# Patient Record
Sex: Male | Born: 1965 | Race: White | Hispanic: No | Marital: Married | Smoking: Never smoker
Health system: Southern US, Community
[De-identification: ages and names within clinical notes are randomized; demographics above are authoritative.]

## PROBLEM LIST (undated history)

## (undated) DIAGNOSIS — M4306 Spondylolysis, lumbar region: Secondary | ICD-10-CM

## (undated) DIAGNOSIS — R361 Hematospermia: Secondary | ICD-10-CM

## (undated) DIAGNOSIS — C4491 Basal cell carcinoma of skin, unspecified: Secondary | ICD-10-CM

## (undated) DIAGNOSIS — Z9889 Other specified postprocedural states: Secondary | ICD-10-CM

## (undated) DIAGNOSIS — M1711 Unilateral primary osteoarthritis, right knee: Secondary | ICD-10-CM

## (undated) DIAGNOSIS — B029 Zoster without complications: Secondary | ICD-10-CM

## (undated) DIAGNOSIS — I480 Paroxysmal atrial fibrillation: Secondary | ICD-10-CM

## (undated) DIAGNOSIS — R112 Nausea with vomiting, unspecified: Secondary | ICD-10-CM

## (undated) HISTORY — DX: Basal cell carcinoma of skin, unspecified: C44.91

## (undated) HISTORY — DX: Paroxysmal atrial fibrillation: I48.0

## (undated) HISTORY — DX: Hematospermia: R36.1

## (undated) HISTORY — DX: Unilateral primary osteoarthritis, right knee: M17.11

## (undated) HISTORY — PX: ORIF DISTAL RADIUS FRACTURE: SUR927

## (undated) HISTORY — PX: NASAL SEPTUM SURGERY: SHX37

## (undated) HISTORY — PX: KNEE ARTHROSCOPY: SUR90

## (undated) HISTORY — PX: VASECTOMY: SHX75

---

## 1988-09-03 HISTORY — PX: KNEE ARTHROSCOPY: SUR90

## 1994-09-03 HISTORY — PX: ANKLE FRACTURE SURGERY: SHX122

## 2009-05-20 ENCOUNTER — Encounter: Admission: RE | Admit: 2009-05-20 | Discharge: 2009-05-20 | Payer: Self-pay | Admitting: Orthopedic Surgery

## 2009-09-03 HISTORY — PX: SHOULDER ARTHROSCOPY W/ ROTATOR CUFF REPAIR: SHX2400

## 2010-06-23 ENCOUNTER — Encounter: Admission: RE | Admit: 2010-06-23 | Discharge: 2010-06-23 | Payer: Self-pay | Admitting: Orthopedic Surgery

## 2010-09-03 HISTORY — PX: SHOULDER ARTHROSCOPY WITH ROTATOR CUFF REPAIR: SHX5685

## 2010-09-25 ENCOUNTER — Encounter: Payer: Self-pay | Admitting: Orthopedic Surgery

## 2012-09-03 DIAGNOSIS — B029 Zoster without complications: Secondary | ICD-10-CM

## 2012-09-03 HISTORY — DX: Zoster without complications: B02.9

## 2012-11-08 ENCOUNTER — Emergency Department (HOSPITAL_BASED_OUTPATIENT_CLINIC_OR_DEPARTMENT_OTHER)
Admission: EM | Admit: 2012-11-08 | Discharge: 2012-11-08 | Disposition: A | Discharge status: Home / Self Care | Payer: BC Managed Care – PPO | Attending: Emergency Medicine | Admitting: Emergency Medicine

## 2012-11-08 ENCOUNTER — Encounter (HOSPITAL_BASED_OUTPATIENT_CLINIC_OR_DEPARTMENT_OTHER): Payer: Self-pay | Admitting: Emergency Medicine

## 2012-11-08 DIAGNOSIS — S81009A Unspecified open wound, unspecified knee, initial encounter: Secondary | ICD-10-CM | POA: Insufficient documentation

## 2012-11-08 DIAGNOSIS — S91009A Unspecified open wound, unspecified ankle, initial encounter: Secondary | ICD-10-CM | POA: Insufficient documentation

## 2012-11-08 DIAGNOSIS — Z8739 Personal history of other diseases of the musculoskeletal system and connective tissue: Secondary | ICD-10-CM | POA: Insufficient documentation

## 2012-11-08 DIAGNOSIS — Y929 Unspecified place or not applicable: Secondary | ICD-10-CM | POA: Insufficient documentation

## 2012-11-08 DIAGNOSIS — W312XXA Contact with powered woodworking and forming machines, initial encounter: Secondary | ICD-10-CM | POA: Insufficient documentation

## 2012-11-08 DIAGNOSIS — Y9389 Activity, other specified: Secondary | ICD-10-CM | POA: Insufficient documentation

## 2012-11-08 DIAGNOSIS — S81012A Laceration without foreign body, left knee, initial encounter: Secondary | ICD-10-CM

## 2012-11-08 DIAGNOSIS — Z23 Encounter for immunization: Secondary | ICD-10-CM | POA: Insufficient documentation

## 2012-11-08 HISTORY — DX: Spondylolysis, lumbar region: M43.06

## 2012-11-08 MED ORDER — TETANUS-DIPHTH-ACELL PERTUSSIS 5-2.5-18.5 LF-MCG/0.5 IM SUSP
0.5000 mL | Freq: Once | INTRAMUSCULAR | Status: AC
  Administered 2012-11-08: 0.5 mL via INTRAMUSCULAR
  Filled 2012-11-08: qty 0.5

## 2012-11-08 MED ORDER — OXYCODONE-ACETAMINOPHEN 5-325 MG PO TABS
2.0000 | ORAL_TABLET | Freq: Once | ORAL | Status: AC
  Administered 2012-11-08: 2 via ORAL
  Filled 2012-11-08 (×2): qty 2
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
## 2012-11-08 NOTE — ED Notes (Signed)
Laceration by chainsaw to left knee.  Bleeding controlled.  Good CMS.

## 2012-11-13 ENCOUNTER — Ambulatory Visit: Payer: BC Managed Care – PPO | Admitting: Family Medicine

## 2013-06-03 ENCOUNTER — Other Ambulatory Visit (HOSPITAL_COMMUNITY): Payer: Self-pay | Admitting: Orthopedic Surgery

## 2013-06-03 DIAGNOSIS — M25561 Pain in right knee: Secondary | ICD-10-CM

## 2013-06-05 ENCOUNTER — Other Ambulatory Visit (HOSPITAL_COMMUNITY): Payer: BC Managed Care – PPO

## 2013-07-15 ENCOUNTER — Encounter (HOSPITAL_COMMUNITY): Payer: Self-pay | Admitting: Pharmacy Technician

## 2013-07-16 ENCOUNTER — Other Ambulatory Visit: Payer: Self-pay | Admitting: Physician Assistant

## 2013-07-16 ENCOUNTER — Encounter: Payer: Self-pay | Admitting: Physician Assistant

## 2013-07-16 DIAGNOSIS — M1711 Unilateral primary osteoarthritis, right knee: Secondary | ICD-10-CM

## 2013-07-16 DIAGNOSIS — M4306 Spondylolysis, lumbar region: Secondary | ICD-10-CM

## 2013-07-16 NOTE — H&P (Signed)
TOTAL KNEE ADMISSION H&P  Patient is being admitted for right total knee arthroplasty.  Subjective:  Chief Complaint:right knee pain.  HPI: Bruce Cobb, 47 y.o. male, has a history of pain and functional disability in the right knee due to trauma and arthritis and has failed non-surgical conservative treatments for greater than 12 weeks to includeNSAID's and/or analgesics, corticosteriod injections, viscosupplementation injections, flexibility and strengthening excercises, supervised PT with diminished ADL's post treatment, use of assistive devices, weight reduction as appropriate and activity modification.  Onset of symptoms was gradual, starting >10 years ago with gradually worsening course since that time. The patient noted prior procedures on the knee to include  arthroscopy, menisectomy and ACL reconstruction on the right knee(s).  Patient currently rates pain in the right knee(s) at 10 out of 10 with activity. Patient has night pain, worsening of pain with activity and weight bearing, pain that interferes with activities of daily living, crepitus and joint swelling.  Patient has evidence of subchondral sclerosis, joint subluxation and joint space narrowing by imaging studies. There is no active infection.  Patient Active Problem List   Diagnosis Date Noted  . Pars defect of lumbar spine   . Right knee DJD    Past Medical History  Diagnosis Date  . Pars defect of lumbar spine   . Right knee DJD     Past Surgical History  Procedure Laterality Date  . Shoulder surgery    . Knee surgery    . Ankle surgery       (Not in a hospital admission) No Known Allergies   Current Outpatient Prescriptions on File Prior to Visit  Medication Sig Dispense Refill  . naproxen sodium (ANAPROX) 220 MG tablet Take 220 mg by mouth 2 (two) times daily as needed (for pain).        No current facility-administered medications on file prior to visit.    History  Substance Use Topics  . Smoking status:  Never Smoker   . Smokeless tobacco: Not on file  . Alcohol Use: Yes     Comment: wine daily    Family History  Problem Relation Age of Onset  . Cancer - Prostate Father      Review of Systems  Constitutional: Negative.   HENT: Negative.   Eyes: Negative.   Respiratory: Negative.   Cardiovascular: Negative.   Gastrointestinal: Negative.   Genitourinary: Negative.   Musculoskeletal: Positive for joint pain.       Right knee pain  Skin: Negative.   Neurological: Negative.   Endo/Heme/Allergies: Negative.   Psychiatric/Behavioral: Negative.     Objective:  Physical Exam  Constitutional: He is oriented to person, place, and time. He appears well-developed and well-nourished.  HENT:  Head: Normocephalic and atraumatic.  Eyes: Conjunctivae and EOM are normal. Pupils are equal, round, and reactive to light.  Neck: Neck supple.  Cardiovascular: Normal rate and regular rhythm.   Respiratory: Effort normal and breath sounds normal.  GI: Soft.  Genitourinary:  Not pertinent to current symptomatology therefore not examined.  Musculoskeletal:  Examination of his right knee reveals pain medially and laterally. 1+ crepitation 1+ synovitis full range of motion knee is stable with normal patella tracking. Exam of the left knee reveals full range of motion without pain swelling weakness or instability.  Neurological: He is alert and oriented to person, place, and time.  Skin: Skin is warm and dry.  Psychiatric: He has a normal mood and affect. His behavior is normal.    Vital signs  in last 24 hours: Last recorded: 11/13 1100   BP: 128/85 Pulse: 62  Temp: 97.8 F (36.6 C)    Height: 6' (1.829 m) SpO2: 95  Weight: 84.369 kg (186 lb)     Labs:   Estimated body mass index is 25.22 kg/(m^2) as calculated from the following:   Height as of this encounter: 6' (1.829 m).   Weight as of this encounter: 84.369 kg (186 lb).   Imaging Review Plain radiographs demonstrate severe  degenerative joint disease of the right knee(s). The overall alignment issignificant varus. The bone quality appears to be good for age and reported activity level.  Assessment/Plan:  End stage arthritis, right knee   The patient history, physical examination, clinical judgment of the provider and imaging studies are consistent with end stage degenerative joint disease of the right knee(s) and total knee arthroplasty is deemed medically necessary. The treatment options including medical management, injection therapy arthroscopy and arthroplasty were discussed at length. The risks and benefits of total knee arthroplasty were presented and reviewed. The risks due to aseptic loosening, infection, stiffness, patella tracking problems, thromboembolic complications and other imponderables were discussed. The patient acknowledged the explanation, agreed to proceed with the plan and consent was signed. Patient is being admitted for inpatient treatment for surgery, pain control, PT, OT, prophylactic antibiotics, VTE prophylaxis, progressive ambulation and ADL's and discharge planning. The patient is planning to be discharged home with home health services  [name] [name] Physician Assistant [facility] [phone]  07/16/2013, 12:08 PM

## 2013-07-17 NOTE — Pre-Procedure Instructions (Signed)
Bruce Cobb  07/17/2013   Your procedure is scheduled on:  Monday July 27, 2013 at 1000  Report to [facility] Short Stay Entrance A at 0800 AM.  Call this number if you have problems the morning of surgery: [phone]   Remember:   Do not eat food or drink liquids after midnight.   Take these medicines the morning of surgery with A SIP OF WATER:    Do not wear jewelry.  Do not wear lotions, powders, or colognes.    Men may shave face and neck.  Do not bring valuables to the hospital.  [facility] is not responsible for any belongings or valuables.               Contacts, dentures or bridgework may not be worn into surgery.  Leave suitcase in the car. After surgery it may be brought to your room.  For patients admitted to the hospital, discharge time is determined by your  treatment team.                 Special Instructions: Shower using CHG 2 nights before surgery and the night before surgery.  If you shower the day of surgery use CHG.  Use special wash - you have one bottle of CHG for all showers.  You should use approximately 1/3 of the bottle for each shower.   Please read over the following fact sheets that you were given: Pain Booklet, Coughing and Deep Breathing, Blood Transfusion Information, MRSA Information and Surgical Site Infection Prevention

## 2013-07-20 ENCOUNTER — Encounter (HOSPITAL_COMMUNITY): Payer: Self-pay

## 2013-07-20 ENCOUNTER — Encounter (HOSPITAL_COMMUNITY)
Admission: RE | Admit: 2013-07-20 | Discharge: 2013-07-20 | Disposition: A | Discharge status: Home / Self Care | Payer: BC Managed Care – PPO | Source: Ambulatory Visit | Attending: Orthopedic Surgery | Admitting: Orthopedic Surgery

## 2013-07-20 DIAGNOSIS — Z01818 Encounter for other preprocedural examination: Secondary | ICD-10-CM | POA: Insufficient documentation

## 2013-07-20 DIAGNOSIS — Z01812 Encounter for preprocedural laboratory examination: Secondary | ICD-10-CM | POA: Insufficient documentation

## 2013-07-20 HISTORY — DX: Other specified postprocedural states: Z98.890

## 2013-07-20 HISTORY — DX: Other specified postprocedural states: R11.2

## 2013-07-20 LAB — URINALYSIS, ROUTINE W REFLEX MICROSCOPIC
Glucose, UA: NEGATIVE mg/dL
Hgb urine dipstick: NEGATIVE
Ketones, ur: NEGATIVE mg/dL
Protein, ur: NEGATIVE mg/dL
Specific Gravity, Urine: 1.009 (ref 1.005–1.030)
Urobilinogen, UA: 0.2 mg/dL (ref 0.0–1.0)

## 2013-07-20 LAB — CBC WITH DIFFERENTIAL/PLATELET
Basophils Absolute: 0 10*3/uL (ref 0.0–0.1)
Basophils Relative: 0 % (ref 0–1)
Eosinophils Absolute: 0.1 10*3/uL (ref 0.0–0.7)
Eosinophils Relative: 1 % (ref 0–5)
HCT: 40.8 % (ref 39.0–52.0)
Hemoglobin: 14.9 g/dL (ref 13.0–17.0)
Lymphs Abs: 1.3 10*3/uL (ref 0.7–4.0)
MCH: 32.6 pg (ref 26.0–34.0)
MCV: 89.3 fL (ref 78.0–100.0)
Monocytes Absolute: 0.6 10*3/uL (ref 0.1–1.0)
Monocytes Relative: 13 % — ABNORMAL HIGH (ref 3–12)
RBC: 4.57 MIL/uL (ref 4.22–5.81)
RDW: 11.9 % (ref 11.5–15.5)

## 2013-07-20 LAB — COMPREHENSIVE METABOLIC PANEL
ALT: 19 U/L (ref 0–53)
Albumin: 4.1 g/dL (ref 3.5–5.2)
Alkaline Phosphatase: 66 U/L (ref 39–117)
BUN: 18 mg/dL (ref 6–23)
Calcium: 9.5 mg/dL (ref 8.4–10.5)
Creatinine, Ser: 0.88 mg/dL (ref 0.50–1.35)
GFR calc Af Amer: >90 mL/min (ref >90)
Glucose, Bld: 82 mg/dL (ref 70–99)
Potassium: 4.5 mEq/L (ref 3.5–5.1)
Total Protein: 7 g/dL (ref 6.0–8.3)

## 2013-07-20 LAB — PROTIME-INR
INR: 0.97 (ref 0.00–1.49)
Prothrombin Time: 12.7 seconds (ref 11.6–15.2)

## 2013-07-20 NOTE — Progress Notes (Signed)
Pt. Requested T&S be done the day of surgery.  EKG and CXR requested from [facility].

## 2013-07-22 LAB — URINE CULTURE
Colony Count: NO GROWTH
Culture: NO GROWTH

## 2013-07-31 NOTE — Progress Notes (Addendum)
I spoke with Bruce Cobb, he reports that surgery is rescheduled for Feb 2015.  I notified Dr Dion Saucier of above.

## 2013-08-03 ENCOUNTER — Inpatient Hospital Stay (HOSPITAL_COMMUNITY)
Admission: RE | Admit: 2013-08-03 | Payer: BC Managed Care – PPO | Source: Ambulatory Visit | Admitting: Orthopedic Surgery

## 2013-08-03 ENCOUNTER — Encounter (HOSPITAL_COMMUNITY): Admission: RE | Payer: BC Managed Care – PPO | Source: Ambulatory Visit

## 2013-08-03 SURGERY — ARTHROPLASTY, KNEE, TOTAL
Anesthesia: General | Laterality: Right

## 2013-09-29 ENCOUNTER — Encounter (HOSPITAL_COMMUNITY): Payer: Self-pay | Admitting: Pharmacy Technician

## 2013-09-30 ENCOUNTER — Other Ambulatory Visit: Payer: Self-pay | Admitting: Physician Assistant

## 2013-09-30 NOTE — H&P (Signed)
TOTAL KNEE ADMISSION H&P  Patient is being admitted for right total knee arthroplasty.  Subjective:  Chief Complaint:right knee pain.  HPI: Bruce Cobb, 48 y.o. male, has a history of pain and functional disability in the right knee due to arthritis and has failed non-surgical conservative treatments for greater than 12 weeks to includeNSAID's and/or analgesics, corticosteriod injections, viscosupplementation injections, flexibility and strengthening excercises, supervised PT with diminished ADL's post treatment, use of assistive devices and activity modification.  Onset of symptoms was gradual, starting 10 years ago with gradually worsening course since that time. The patient noted prior procedures on the knee to include  arthroscopy, menisectomy and ACL reconstruction on the right knee(s).  Patient currently rates pain in the right knee(s) at 10 out of 10 with activity. Patient has night pain, worsening of pain with activity and weight bearing, pain that interferes with activities of daily living, crepitus and joint swelling.  Patient has evidence of subchondral sclerosis, periarticular osteophytes and joint space narrowing by imaging studies.There is no active infection.  Patient Active Problem List   Diagnosis Date Noted  . Pars defect of lumbar spine   . Right knee DJD    Past Medical History  Diagnosis Date  . Pars defect of lumbar spine   . Right knee DJD   . PONV (postoperative nausea and vomiting)     Past Surgical History  Procedure Laterality Date  . Shoulder surgery    . Knee surgery    . Ankle surgery    . Nasal septum surgery       (Not in a hospital admission) No Known Allergies  History  Substance Use Topics  . Smoking status: Never Smoker   . Smokeless tobacco: Not on file  . Alcohol Use: Yes     Comment: wine daily    Family History  Problem Relation Age of Onset  . Cancer - Prostate Father      Review of Systems  Constitutional: Negative.   HENT:  Negative.   Eyes: Negative.   Respiratory: Negative.   Cardiovascular: Negative.   Gastrointestinal: Negative.   Genitourinary: Negative.   Musculoskeletal: Positive for back pain and joint pain.  Skin: Negative.   Neurological: Negative.   Endo/Heme/Allergies: Negative.   Psychiatric/Behavioral: Negative.     Objective:  Physical Exam  Constitutional: He is oriented to person, place, and time. He appears well-developed and well-nourished.  HENT:  Head: Normocephalic and atraumatic.  Mouth/Throat: Oropharynx is clear and moist.  Eyes: Conjunctivae are normal. Pupils are equal, round, and reactive to light.  Neck: Neck supple.  Cardiovascular: Normal rate, regular rhythm and intact distal pulses.   Respiratory: Effort normal.  GI: Soft.  Genitourinary:  Not pertinent to current symptomatology therefore not examined.  Musculoskeletal:  Right knee has active range of motion -5 to 120 degrees 2+ crepitus 2+ synovitis normal patella tracking peripatellar pain and lateral joint line tenderness. Left knee has full range of motion without pain swelling weakness or instability. He is alert and oriented. He has low back pain but no radicular pain.   Neurological: He is alert and oriented to person, place, and time.  Skin: Skin is warm and dry.  Psychiatric: He has a normal mood and affect. His behavior is normal.    Vital signs in last 24 hours: Last recorded: 01/28 1500   BP: 125/79    Temp: 98.1 F (36.7 C)    Height: 5\' 11"  (1.803 m) SpO2: 95  Weight: 90.719 kg (200 lb)  Labs:   Estimated body mass index is 27.91 kg/(m^2) as calculated from the following:   Height as of this encounter: 5\' 11"  (1.803 m).   Weight as of this encounter: 90.719 kg (200 lb).   Imaging Review Plain radiographs demonstrate severe degenerative joint disease of the right knee(s). The overall alignment issignificant varus. The bone quality appears to be good for age and reported activity  level.  Assessment/Plan:  End stage arthritis, right knee   The patient history, physical examination, clinical judgment of the provider and imaging studies are consistent with end stage degenerative joint disease of the right knee(s) and total knee arthroplasty is deemed medically necessary. The treatment options including medical management, injection therapy arthroscopy and arthroplasty were discussed at length. The risks and benefits of total knee arthroplasty were presented and reviewed. The risks due to aseptic loosening, infection, stiffness, patella tracking problems, thromboembolic complications and other imponderables were discussed. The patient acknowledged the explanation, agreed to proceed with the plan and consent was signed. Patient is being admitted for inpatient treatment for surgery, pain control, PT, OT, prophylactic antibiotics, VTE prophylaxis, progressive ambulation and ADL's and discharge planning. The patient is planning to be discharged home with home health services  Manford Sprong A. Kaleen Mask Physician Assistant [facility] [phone]  09/30/2013, 3:17 PM

## 2013-10-02 ENCOUNTER — Encounter (HOSPITAL_COMMUNITY): Payer: Self-pay

## 2013-10-02 ENCOUNTER — Encounter (HOSPITAL_COMMUNITY)
Admission: RE | Admit: 2013-10-02 | Discharge: 2013-10-02 | Disposition: A | Discharge status: Home / Self Care | Payer: BC Managed Care – PPO | Source: Ambulatory Visit | Attending: Orthopedic Surgery | Admitting: Orthopedic Surgery

## 2013-10-02 DIAGNOSIS — Z01818 Encounter for other preprocedural examination: Secondary | ICD-10-CM | POA: Insufficient documentation

## 2013-10-02 DIAGNOSIS — Z01812 Encounter for preprocedural laboratory examination: Secondary | ICD-10-CM | POA: Insufficient documentation

## 2013-10-02 HISTORY — DX: Zoster without complications: B02.9

## 2013-10-02 LAB — COMPREHENSIVE METABOLIC PANEL
ALT: 24 U/L (ref 0–53)
AST: 31 U/L (ref 0–37)
Albumin: 4.1 g/dL (ref 3.5–5.2)
Alkaline Phosphatase: 56 U/L (ref 39–117)
BUN: 14 mg/dL (ref 6–23)
CALCIUM: 9.5 mg/dL (ref 8.4–10.5)
CHLORIDE: 101 meq/L (ref 96–112)
CO2: 24 meq/L (ref 19–32)
CREATININE: 0.97 mg/dL (ref 0.50–1.35)
GLUCOSE: 91 mg/dL (ref 70–99)
Potassium: 4.4 mEq/L (ref 3.7–5.3)
Sodium: 138 mEq/L (ref 137–147)
Total Bilirubin: 0.8 mg/dL (ref 0.3–1.2)
Total Protein: 7 g/dL (ref 6.0–8.3)

## 2013-10-02 LAB — CBC WITH DIFFERENTIAL/PLATELET
Basophils Absolute: 0 10*3/uL (ref 0.0–0.1)
Basophils Relative: 0 % (ref 0–1)
EOS PCT: 1 % (ref 0–5)
Eosinophils Absolute: 0.1 10*3/uL (ref 0.0–0.7)
HEMATOCRIT: 40.6 % (ref 39.0–52.0)
HEMOGLOBIN: 14.5 g/dL (ref 13.0–17.0)
LYMPHS ABS: 1.3 10*3/uL (ref 0.7–4.0)
LYMPHS PCT: 31 % (ref 12–46)
MCH: 32.1 pg (ref 26.0–34.0)
MCHC: 35.7 g/dL (ref 30.0–36.0)
MCV: 89.8 fL (ref 78.0–100.0)
MONO ABS: 0.7 10*3/uL (ref 0.1–1.0)
MONOS PCT: 16 % — AB (ref 3–12)
NEUTROS ABS: 2.1 10*3/uL (ref 1.7–7.7)
Neutrophils Relative %: 51 % (ref 43–77)
Platelets: 228 10*3/uL (ref 150–400)
RBC: 4.52 MIL/uL (ref 4.22–5.81)
RDW: 12.2 % (ref 11.5–15.5)
WBC: 4.1 10*3/uL (ref 4.0–10.5)

## 2013-10-02 LAB — PROTIME-INR
INR: 0.97 (ref 0.00–1.49)
Prothrombin Time: 12.7 seconds (ref 11.6–15.2)

## 2013-10-02 LAB — URINALYSIS, ROUTINE W REFLEX MICROSCOPIC
Bilirubin Urine: NEGATIVE
Glucose, UA: NEGATIVE mg/dL
Hgb urine dipstick: NEGATIVE
KETONES UR: NEGATIVE mg/dL
Leukocytes, UA: NEGATIVE
NITRITE: NEGATIVE
PH: 7.5 (ref 5.0–8.0)
Protein, ur: NEGATIVE mg/dL
Specific Gravity, Urine: 1.016 (ref 1.005–1.030)
Urobilinogen, UA: 0.2 mg/dL (ref 0.0–1.0)

## 2013-10-02 LAB — APTT: APTT: 32 s (ref 24–37)

## 2013-10-02 MED ORDER — CHLORHEXIDINE GLUCONATE 4 % EX LIQD: 60.0000 mL | Freq: Once | CUTANEOUS | Status: DC

## 2013-10-02 MED ORDER — POVIDONE-IODINE 7.5 % EX SOLN: Freq: Once | CUTANEOUS | Status: DC

## 2013-10-02 NOTE — Pre-Procedure Instructions (Signed)
Bruce Cobb  10/02/2013   Your procedure is scheduled on:  Mon, Feb 9 @ 10:00 AM  Report to [facility] Short Stay Entrance A  at 8:00 AM.  Call this number if you have problems the morning of surgery: [phone]   Remember:   Do not eat food or drink liquids after midnight.                 Stop taking your Aleve a week before surgery. No Goody's,BC's,Aspirin,Ibuprofen,Fish Oil,or any Herbal Medications   Do not wear jewelry.  Do not wear lotions, powders, or colognes. You may wear deodorant.  Men may shave face and neck.  Do not bring valuables to the hospital.  [facility] is not responsible                  for any belongings or valuables.               Contacts, dentures or bridgework may not be worn into surgery.  Leave suitcase in the car. After surgery it may be brought to your room.  For patients admitted to the hospital, discharge time is determined by your                treatment team.                   Special Instructions: Shower using CHG 2 nights before surgery and the night before surgery.  If you shower the day of surgery use CHG.  Use special wash - you have one bottle of CHG for all showers.  You should use approximately 1/3 of the bottle for each shower.   Please read over the following fact sheets that you were given: Pain Booklet, Coughing and Deep Breathing, Blood Transfusion Information, MRSA Information and Surgical Site Infection Prevention

## 2013-10-02 NOTE — Progress Notes (Signed)
CXR and EKG requested from [facility]  Pt refused to wear blue bracelet so type and screen DOS

## 2013-10-03 LAB — URINE CULTURE: Colony Count: 3000

## 2013-10-09 NOTE — Progress Notes (Signed)
Nurse called patient under "home" number listed and "Vinnie Level" answered phone and said patient was unavailable but asked if she could take a message. Nurse told Vinnie Level to tell patient to arrive at short stay on Monday at 7:00 AM instead of 8:00 AM.  Vinnie Level verbalized understanding.

## 2013-10-11 MED ORDER — TRANEXAMIC ACID 100 MG/ML IV SOLN
1000.0000 mg | INTRAVENOUS | Status: DC
  Filled 2013-10-11: qty 10

## 2013-10-11 MED ORDER — CEFAZOLIN SODIUM-DEXTROSE 2-3 GM-% IV SOLR
2.0000 g | INTRAVENOUS | Status: AC
  Administered 2013-10-12: 2 g via INTRAVENOUS
  Filled 2013-10-11: qty 50

## 2013-10-12 ENCOUNTER — Inpatient Hospital Stay (HOSPITAL_COMMUNITY)
Admission: RE | Admit: 2013-10-12 | Discharge: 2013-10-13 | DRG: 470 | Disposition: A | Discharge status: Home Health | Payer: BC Managed Care – PPO | Source: Ambulatory Visit | Attending: Orthopedic Surgery | Admitting: Orthopedic Surgery

## 2013-10-12 ENCOUNTER — Inpatient Hospital Stay (HOSPITAL_COMMUNITY): Payer: BC Managed Care – PPO

## 2013-10-12 ENCOUNTER — Inpatient Hospital Stay (HOSPITAL_COMMUNITY): Payer: BC Managed Care – PPO | Admitting: Anesthesiology

## 2013-10-12 ENCOUNTER — Encounter (HOSPITAL_COMMUNITY): Payer: Self-pay | Admitting: Surgery

## 2013-10-12 ENCOUNTER — Encounter (HOSPITAL_COMMUNITY)
Admission: RE | Disposition: A | Discharge status: Home Health | Payer: Self-pay | Source: Ambulatory Visit | Attending: Orthopedic Surgery

## 2013-10-12 ENCOUNTER — Encounter (HOSPITAL_COMMUNITY): Payer: BC Managed Care – PPO | Admitting: Anesthesiology

## 2013-10-12 DIAGNOSIS — M1710 Unilateral primary osteoarthritis, unspecified knee: Principal | ICD-10-CM | POA: Diagnosis present

## 2013-10-12 DIAGNOSIS — Z8042 Family history of malignant neoplasm of prostate: Secondary | ICD-10-CM

## 2013-10-12 DIAGNOSIS — M4306 Spondylolysis, lumbar region: Secondary | ICD-10-CM | POA: Diagnosis present

## 2013-10-12 DIAGNOSIS — M179 Osteoarthritis of knee, unspecified: Secondary | ICD-10-CM

## 2013-10-12 DIAGNOSIS — Z79899 Other long term (current) drug therapy: Secondary | ICD-10-CM

## 2013-10-12 DIAGNOSIS — M1990 Unspecified osteoarthritis, unspecified site: Secondary | ICD-10-CM | POA: Diagnosis present

## 2013-10-12 DIAGNOSIS — Z7982 Long term (current) use of aspirin: Secondary | ICD-10-CM

## 2013-10-12 HISTORY — PX: TOTAL KNEE ARTHROPLASTY: SHX125

## 2013-10-12 LAB — TYPE AND SCREEN
ABO/RH(D): O POS
Antibody Screen: NEGATIVE

## 2013-10-12 LAB — ABO/RH: ABO/RH(D): O POS

## 2013-10-12 SURGERY — ARTHROPLASTY, KNEE, TOTAL
Anesthesia: General | Site: Knee | Laterality: Right

## 2013-10-12 MED ORDER — BISACODYL 5 MG PO TBEC
10.0000 mg | DELAYED_RELEASE_TABLET | Freq: Every day | ORAL | Status: DC
  Administered 2013-10-12: 10 mg via ORAL
  Filled 2013-10-12: qty 2

## 2013-10-12 MED ORDER — DEXAMETHASONE 6 MG PO TABS
10.0000 mg | ORAL_TABLET | Freq: Three times a day (TID) | ORAL | Status: AC
  Filled 2013-10-12 (×3): qty 1

## 2013-10-12 MED ORDER — ACETAMINOPHEN 325 MG PO TABS: 650.0000 mg | ORAL_TABLET | Freq: Four times a day (QID) | ORAL | Status: DC | PRN

## 2013-10-12 MED ORDER — BUPIVACAINE-EPINEPHRINE PF 0.5-1:200000 % IJ SOLN
INTRAMUSCULAR | Status: DC | PRN
  Administered 2013-10-12: 150 mg via PERINEURAL

## 2013-10-12 MED ORDER — DIPHENHYDRAMINE HCL 12.5 MG/5ML PO ELIX: 12.5000 mg | ORAL_SOLUTION | ORAL | Status: DC | PRN

## 2013-10-12 MED ORDER — LACTATED RINGERS IV SOLN
INTRAVENOUS | Status: DC | PRN
  Administered 2013-10-12 (×2): via INTRAVENOUS

## 2013-10-12 MED ORDER — METOCLOPRAMIDE HCL 5 MG/ML IJ SOLN
INTRAMUSCULAR | Status: AC
  Filled 2013-10-12: qty 2

## 2013-10-12 MED ORDER — SODIUM CHLORIDE 0.9 % IR SOLN
Status: DC | PRN
  Administered 2013-10-12: 3000 mL

## 2013-10-12 MED ORDER — PROPOFOL 10 MG/ML IV BOLUS
INTRAVENOUS | Status: AC
  Filled 2013-10-12: qty 20

## 2013-10-12 MED ORDER — ONDANSETRON HCL 4 MG/2ML IJ SOLN
INTRAMUSCULAR | Status: AC
Start: 2013-10-12 — End: 2013-10-12
  Filled 2013-10-12: qty 2

## 2013-10-12 MED ORDER — MIDAZOLAM HCL 5 MG/5ML IJ SOLN
INTRAMUSCULAR | Status: DC | PRN
  Administered 2013-10-12: 2 mg via INTRAVENOUS

## 2013-10-12 MED ORDER — BUPIVACAINE-EPINEPHRINE (PF) 0.25% -1:200000 IJ SOLN
INTRAMUSCULAR | Status: AC
  Filled 2013-10-12: qty 30

## 2013-10-12 MED ORDER — PROPOFOL 10 MG/ML IV BOLUS
INTRAVENOUS | Status: DC | PRN
  Administered 2013-10-12: 300 mg via INTRAVENOUS
  Administered 2013-10-12: 20 mg via INTRAVENOUS
  Administered 2013-10-12: 50 mg via INTRAVENOUS

## 2013-10-12 MED ORDER — DEXAMETHASONE SODIUM PHOSPHATE 10 MG/ML IJ SOLN
INTRAMUSCULAR | Status: DC | PRN
  Administered 2013-10-12: 4 mg via INTRAVENOUS

## 2013-10-12 MED ORDER — OXYCODONE HCL 5 MG PO TABS
ORAL_TABLET | ORAL | Status: AC
  Filled 2013-10-12: qty 1

## 2013-10-12 MED ORDER — 0.9 % SODIUM CHLORIDE (POUR BTL) OPTIME
TOPICAL | Status: DC | PRN
  Administered 2013-10-12: 1000 mL

## 2013-10-12 MED ORDER — CEFUROXIME SODIUM 1.5 G IJ SOLR
INTRAMUSCULAR | Status: AC
  Filled 2013-10-12: qty 1.5

## 2013-10-12 MED ORDER — ARTIFICIAL TEARS OP OINT
TOPICAL_OINTMENT | OPHTHALMIC | Status: DC | PRN
  Administered 2013-10-12: 1 via OPHTHALMIC

## 2013-10-12 MED ORDER — FENTANYL CITRATE 0.05 MG/ML IJ SOLN
INTRAMUSCULAR | Status: AC
  Filled 2013-10-12: qty 2

## 2013-10-12 MED ORDER — MIDAZOLAM HCL 2 MG/2ML IJ SOLN
INTRAMUSCULAR | Status: AC
Start: 2013-10-12 — End: 2013-10-12
  Filled 2013-10-12: qty 2

## 2013-10-12 MED ORDER — PHENOL 1.4 % MT LIQD: 1.0000 | OROMUCOSAL | Status: DC | PRN

## 2013-10-12 MED ORDER — LACTATED RINGERS IV SOLN
INTRAVENOUS | Status: DC
  Administered 2013-10-12: 08:00:00 via INTRAVENOUS

## 2013-10-12 MED ORDER — ONDANSETRON HCL 4 MG/2ML IJ SOLN
4.0000 mg | Freq: Four times a day (QID) | INTRAMUSCULAR | Status: DC | PRN
  Administered 2013-10-12 – 2013-10-13 (×2): 4 mg via INTRAVENOUS
  Filled 2013-10-12 (×2): qty 2

## 2013-10-12 MED ORDER — OXYCODONE HCL 5 MG PO TABS: 5.0000 mg | ORAL_TABLET | Freq: Once | ORAL | Status: DC | PRN

## 2013-10-12 MED ORDER — HYDROMORPHONE HCL PF 1 MG/ML IJ SOLN
INTRAMUSCULAR | Status: AC
  Filled 2013-10-12: qty 1

## 2013-10-12 MED ORDER — CEFAZOLIN SODIUM-DEXTROSE 2-3 GM-% IV SOLR
2.0000 g | Freq: Four times a day (QID) | INTRAVENOUS | Status: AC
  Administered 2013-10-12 (×2): 2 g via INTRAVENOUS
  Filled 2013-10-12 (×3): qty 50

## 2013-10-12 MED ORDER — ONDANSETRON HCL 4 MG PO TABS: 4.0000 mg | ORAL_TABLET | Freq: Four times a day (QID) | ORAL | Status: DC | PRN

## 2013-10-12 MED ORDER — HYDROMORPHONE HCL PF 1 MG/ML IJ SOLN
1.0000 mg | INTRAMUSCULAR | Status: DC | PRN
  Administered 2013-10-13: 1 mg via INTRAVENOUS
  Filled 2013-10-12: qty 1

## 2013-10-12 MED ORDER — MORPHINE SULFATE 10 MG/ML IJ SOLN
INTRAMUSCULAR | Status: DC | PRN
  Administered 2013-10-12: 8 mg via INTRAVENOUS
  Administered 2013-10-12: 2 mg via INTRAVENOUS

## 2013-10-12 MED ORDER — FENTANYL CITRATE 0.05 MG/ML IJ SOLN
INTRAMUSCULAR | Status: AC
  Filled 2013-10-12: qty 5

## 2013-10-12 MED ORDER — OXYCODONE HCL 5 MG PO TABS
5.0000 mg | ORAL_TABLET | ORAL | Status: DC | PRN
  Administered 2013-10-12 (×3): 10 mg via ORAL
  Administered 2013-10-12: 5 mg via ORAL
  Administered 2013-10-13 (×2): 10 mg via ORAL
  Filled 2013-10-12 (×5): qty 2

## 2013-10-12 MED ORDER — METOCLOPRAMIDE HCL 5 MG PO TABS
5.0000 mg | ORAL_TABLET | Freq: Three times a day (TID) | ORAL | Status: DC | PRN
Start: 2013-10-12 — End: 2013-10-13
  Filled 2013-10-12: qty 2

## 2013-10-12 MED ORDER — ASPIRIN EC 325 MG PO TBEC
325.0000 mg | DELAYED_RELEASE_TABLET | Freq: Every day | ORAL | Status: DC
  Administered 2013-10-13: 325 mg via ORAL
  Filled 2013-10-12 (×2): qty 1

## 2013-10-12 MED ORDER — EPHEDRINE SULFATE 50 MG/ML IJ SOLN
INTRAMUSCULAR | Status: AC
  Filled 2013-10-12: qty 1

## 2013-10-12 MED ORDER — METOCLOPRAMIDE HCL 5 MG/ML IJ SOLN
5.0000 mg | Freq: Three times a day (TID) | INTRAMUSCULAR | Status: DC | PRN
  Administered 2013-10-12: 10 mg via INTRAVENOUS
  Filled 2013-10-12: qty 2

## 2013-10-12 MED ORDER — MIDAZOLAM HCL 2 MG/2ML IJ SOLN
INTRAMUSCULAR | Status: AC
  Filled 2013-10-12: qty 2

## 2013-10-12 MED ORDER — HYDROMORPHONE HCL PF 1 MG/ML IJ SOLN
0.2500 mg | INTRAMUSCULAR | Status: DC | PRN
  Administered 2013-10-12 (×4): 0.5 mg via INTRAVENOUS

## 2013-10-12 MED ORDER — ONDANSETRON HCL 4 MG/2ML IJ SOLN
INTRAMUSCULAR | Status: DC | PRN
  Administered 2013-10-12: 4 mg via INTRAVENOUS

## 2013-10-12 MED ORDER — DEXAMETHASONE SODIUM PHOSPHATE 4 MG/ML IJ SOLN
INTRAMUSCULAR | Status: AC
  Filled 2013-10-12: qty 1

## 2013-10-12 MED ORDER — DEXAMETHASONE SODIUM PHOSPHATE 10 MG/ML IJ SOLN
INTRAMUSCULAR | Status: DC | PRN
  Administered 2013-10-12: 6 mg

## 2013-10-12 MED ORDER — DOCUSATE SODIUM 100 MG PO CAPS
100.0000 mg | ORAL_CAPSULE | Freq: Two times a day (BID) | ORAL | Status: DC
  Administered 2013-10-13: 100 mg via ORAL
  Filled 2013-10-12 (×3): qty 1

## 2013-10-12 MED ORDER — ROCURONIUM BROMIDE 50 MG/5ML IV SOLN
INTRAVENOUS | Status: AC
  Filled 2013-10-12: qty 1

## 2013-10-12 MED ORDER — ACETAMINOPHEN 650 MG RE SUPP: 650.0000 mg | Freq: Four times a day (QID) | RECTAL | Status: DC | PRN

## 2013-10-12 MED ORDER — LIDOCAINE HCL (CARDIAC) 20 MG/ML IV SOLN
INTRAVENOUS | Status: AC
Start: 2013-10-12 — End: 2013-10-12
  Filled 2013-10-12: qty 5

## 2013-10-12 MED ORDER — SODIUM CHLORIDE 0.9 % IJ SOLN
INTRAMUSCULAR | Status: AC
  Filled 2013-10-12: qty 10

## 2013-10-12 MED ORDER — FENTANYL CITRATE 0.05 MG/ML IJ SOLN
INTRAMUSCULAR | Status: DC | PRN
  Administered 2013-10-12: 50 ug via INTRAVENOUS
  Administered 2013-10-12: 25 ug via INTRAVENOUS
  Administered 2013-10-12 (×2): 100 ug via INTRAVENOUS
  Administered 2013-10-12 (×2): 25 ug via INTRAVENOUS

## 2013-10-12 MED ORDER — METOCLOPRAMIDE HCL 5 MG/ML IJ SOLN
INTRAMUSCULAR | Status: DC | PRN
  Administered 2013-10-12: 5 mg via INTRAVENOUS

## 2013-10-12 MED ORDER — ARTIFICIAL TEARS OP OINT
TOPICAL_OINTMENT | OPHTHALMIC | Status: AC
  Filled 2013-10-12: qty 3.5

## 2013-10-12 MED ORDER — MORPHINE SULFATE 10 MG/ML IJ SOLN
INTRAMUSCULAR | Status: AC
  Filled 2013-10-12: qty 1

## 2013-10-12 MED ORDER — SUCCINYLCHOLINE CHLORIDE 20 MG/ML IJ SOLN
INTRAMUSCULAR | Status: AC
  Filled 2013-10-12: qty 1

## 2013-10-12 MED ORDER — ZOLPIDEM TARTRATE 5 MG PO TABS: 5.0000 mg | ORAL_TABLET | Freq: Every evening | ORAL | Status: DC | PRN

## 2013-10-12 MED ORDER — ACETAMINOPHEN 10 MG/ML IV SOLN
INTRAVENOUS | Status: AC
  Administered 2013-10-12: 1000 mg via INTRAVENOUS
  Filled 2013-10-12: qty 100

## 2013-10-12 MED ORDER — ALUM & MAG HYDROXIDE-SIMETH 200-200-20 MG/5ML PO SUSP: 30.0000 mL | ORAL | Status: DC | PRN

## 2013-10-12 MED ORDER — PROMETHAZINE HCL 25 MG/ML IJ SOLN: 6.2500 mg | INTRAMUSCULAR | Status: DC | PRN

## 2013-10-12 MED ORDER — OXYCODONE HCL 5 MG/5ML PO SOLN: 5.0000 mg | Freq: Once | ORAL | Status: DC | PRN

## 2013-10-12 MED ORDER — CEFUROXIME SODIUM 1.5 G IJ SOLR
INTRAMUSCULAR | Status: DC | PRN
  Administered 2013-10-12: 1.5 g

## 2013-10-12 MED ORDER — MENTHOL 3 MG MT LOZG: 1.0000 | LOZENGE | OROMUCOSAL | Status: DC | PRN

## 2013-10-12 MED ORDER — DEXAMETHASONE SODIUM PHOSPHATE 10 MG/ML IJ SOLN
10.0000 mg | Freq: Three times a day (TID) | INTRAMUSCULAR | Status: AC
  Administered 2013-10-12 – 2013-10-13 (×3): 10 mg via INTRAVENOUS
  Filled 2013-10-12 (×3): qty 1

## 2013-10-12 MED ORDER — CELECOXIB 200 MG PO CAPS
200.0000 mg | ORAL_CAPSULE | Freq: Two times a day (BID) | ORAL | Status: DC
  Administered 2013-10-12 – 2013-10-13 (×2): 200 mg via ORAL
  Filled 2013-10-12 (×4): qty 1

## 2013-10-12 MED ORDER — BUPIVACAINE-EPINEPHRINE 0.25% -1:200000 IJ SOLN
INTRAMUSCULAR | Status: DC | PRN
  Administered 2013-10-12: 30 mL

## 2013-10-12 MED ORDER — POTASSIUM CHLORIDE IN NACL 20-0.9 MEQ/L-% IV SOLN
INTRAVENOUS | Status: DC
  Administered 2013-10-12: 18:00:00 via INTRAVENOUS
  Filled 2013-10-12 (×4): qty 1000

## 2013-10-12 SURGICAL SUPPLY — 71 items
BANDAGE ELASTIC 6 VELCRO ST LF (GAUZE/BANDAGES/DRESSINGS) ×2 IMPLANT
BANDAGE ESMARK 6X9 LF (GAUZE/BANDAGES/DRESSINGS) ×1 IMPLANT
BLADE SAGITTAL 25.0X1.19X90 (BLADE) ×2 IMPLANT
BLADE SAGITTAL 25.0X1.19X90MM (BLADE) ×1
BLADE SAW SGTL 11.0X1.19X90.0M (BLADE) IMPLANT
BLADE SAW SGTL 13.0X1.19X90.0M (BLADE) ×3 IMPLANT
BLADE SURG 10 STRL SS (BLADE) ×6 IMPLANT
BNDG CMPR 9X6 STRL LF SNTH (GAUZE/BANDAGES/DRESSINGS) ×1
BNDG CMPR MED 15X6 ELC VLCR LF (GAUZE/BANDAGES/DRESSINGS) ×1
BNDG ELASTIC 6X15 VLCR STRL LF (GAUZE/BANDAGES/DRESSINGS) ×3 IMPLANT
BNDG ESMARK 6X9 LF (GAUZE/BANDAGES/DRESSINGS) ×3
BOWL SMART MIX CTS (DISPOSABLE) ×3 IMPLANT
CAPT RP KNEE ×2 IMPLANT
CEMENT HV SMART SET (Cement) ×6 IMPLANT
CLOSURE STERI-STRIP 1/2X4 (GAUZE/BANDAGES/DRESSINGS) ×1
CLOSURE WOUND 1/2 X4 (GAUZE/BANDAGES/DRESSINGS) ×1
CLOTH BEACON ORANGE TIMEOUT ST (SAFETY) ×3 IMPLANT
CLSR STERI-STRIP ANTIMIC 1/2X4 (GAUZE/BANDAGES/DRESSINGS) ×1 IMPLANT
COVER SURGICAL LIGHT HANDLE (MISCELLANEOUS) ×3 IMPLANT
CUFF TOURNIQUET SINGLE 34IN LL (TOURNIQUET CUFF) ×3 IMPLANT
CUFF TOURNIQUET SINGLE 44IN (TOURNIQUET CUFF) IMPLANT
DRAPE EXTREMITY T 121X128X90 (DRAPE) ×3 IMPLANT
DRAPE INCISE IOBAN 66X45 STRL (DRAPES) ×3 IMPLANT
DRAPE PROXIMA HALF (DRAPES) ×3 IMPLANT
DRAPE U-SHAPE 47X51 STRL (DRAPES) ×3 IMPLANT
DRSG ADAPTIC 3X8 NADH LF (GAUZE/BANDAGES/DRESSINGS) ×3 IMPLANT
DRSG PAD ABDOMINAL 8X10 ST (GAUZE/BANDAGES/DRESSINGS) ×6 IMPLANT
DURAPREP 26ML APPLICATOR (WOUND CARE) ×6 IMPLANT
ELECT CAUTERY BLADE 6.4 (BLADE) ×3 IMPLANT
ELECT REM PT RETURN 9FT ADLT (ELECTROSURGICAL) ×3
ELECTRODE REM PT RTRN 9FT ADLT (ELECTROSURGICAL) ×1 IMPLANT
EVACUATOR 1/8 PVC DRAIN (DRAIN) ×3 IMPLANT
FACESHIELD LNG OPTICON STERILE (SAFETY) ×3 IMPLANT
GLOVE BIO SURGEON STRL SZ7 (GLOVE) ×3 IMPLANT
GLOVE BIOGEL PI IND STRL 7.0 (GLOVE) ×1 IMPLANT
GLOVE BIOGEL PI IND STRL 7.5 (GLOVE) ×1 IMPLANT
GLOVE BIOGEL PI INDICATOR 7.0 (GLOVE) ×2
GLOVE BIOGEL PI INDICATOR 7.5 (GLOVE) ×2
GLOVE SS BIOGEL STRL SZ 7.5 (GLOVE) ×1 IMPLANT
GLOVE SUPERSENSE BIOGEL SZ 7.5 (GLOVE) ×2
GOWN PREVENTION PLUS XLARGE (GOWN DISPOSABLE) ×6 IMPLANT
GOWN STRL NON-REIN LRG LVL3 (GOWN DISPOSABLE) ×6 IMPLANT
HANDPIECE INTERPULSE COAX TIP (DISPOSABLE) ×3
HOOD PEEL AWAY FACE SHEILD DIS (HOOD) ×6 IMPLANT
IMMOBILIZER KNEE 22 UNIV (SOFTGOODS) IMPLANT
KIT BASIN OR (CUSTOM PROCEDURE TRAY) ×3 IMPLANT
KIT ROOM TURNOVER OR (KITS) ×3 IMPLANT
MANIFOLD NEPTUNE II (INSTRUMENTS) ×3 IMPLANT
NS IRRIG 1000ML POUR BTL (IV SOLUTION) ×3 IMPLANT
PACK TOTAL JOINT (CUSTOM PROCEDURE TRAY) ×3 IMPLANT
PAD ARMBOARD 7.5X6 YLW CONV (MISCELLANEOUS) ×6 IMPLANT
PAD CAST 4YDX4 CTTN HI CHSV (CAST SUPPLIES) ×1 IMPLANT
PADDING CAST COTTON 4X4 STRL (CAST SUPPLIES) ×3
PADDING CAST COTTON 6X4 STRL (CAST SUPPLIES) ×3 IMPLANT
RUBBERBAND STERILE (MISCELLANEOUS) ×3 IMPLANT
SET HNDPC FAN SPRY TIP SCT (DISPOSABLE) ×1 IMPLANT
SPONGE GAUZE 4X4 12PLY (GAUZE/BANDAGES/DRESSINGS) ×3 IMPLANT
SPONGE GAUZE 4X4 12PLY STER LF (GAUZE/BANDAGES/DRESSINGS) ×2 IMPLANT
STRIP CLOSURE SKIN 1/2X4 (GAUZE/BANDAGES/DRESSINGS) ×2 IMPLANT
SUCTION FRAZIER TIP 10 FR DISP (SUCTIONS) ×3 IMPLANT
SUT ETHIBOND NAB CT1 #1 30IN (SUTURE) ×6 IMPLANT
SUT MNCRL AB 3-0 PS2 18 (SUTURE) ×3 IMPLANT
SUT VIC AB 0 CT1 27 (SUTURE) ×6
SUT VIC AB 0 CT1 27XBRD ANBCTR (SUTURE) ×2 IMPLANT
SUT VIC AB 2-0 CT1 27 (SUTURE) ×6
SUT VIC AB 2-0 CT1 TAPERPNT 27 (SUTURE) ×2 IMPLANT
SYR 30ML SLIP (SYRINGE) ×3 IMPLANT
TOWEL OR 17X24 6PK STRL BLUE (TOWEL DISPOSABLE) ×3 IMPLANT
TOWEL OR 17X26 10 PK STRL BLUE (TOWEL DISPOSABLE) ×3 IMPLANT
TRAY FOLEY CATH 16FR SILVER (SET/KITS/TRAYS/PACK) ×3 IMPLANT
WATER STERILE IRR 1000ML POUR (IV SOLUTION) ×6 IMPLANT

## 2013-10-12 NOTE — Progress Notes (Signed)
Orthopedic Tech Progress Note Patient Details:  Bruce Cobb [birth year] [id number] CPM applied to Right LE with appropriate settings. OHF applied to bed. Footsie roll provided. CPM Right Knee CPM Right Knee: On Right Knee Flexion (Degrees): 60 Right Knee Extension (Degrees): 0   Asia R Thompson 10/12/2013, 1:36 PM

## 2013-10-12 NOTE — Progress Notes (Signed)
Utilization review completed.  

## 2013-10-12 NOTE — Anesthesia Postprocedure Evaluation (Signed)
Anesthesia Post Note  Patient: Bruce Cobb  Procedure(s) Performed: Procedure(s) (LRB): RIGHT TOTAL KNEE ARTHROPLASTY (Right)  Anesthesia type: general  Patient location: PACU  Post pain: Pain level controlled  Post assessment: Patient's Cardiovascular Status Stable  Last Vitals:  Filed Vitals:   10/12/13 1345  BP:   Pulse:   Temp: 36.1 C  Resp:     Post vital signs: Reviewed and stable  Level of consciousness: sedated  Complications: No apparent anesthesia complications

## 2013-10-12 NOTE — Interval H&P Note (Signed)
History and Physical Interval Note:  10/12/2013 9:04 AM  Bruce Cobb  has presented today for surgery, with the diagnosis of DJD Right Knee  The various methods of treatment have been discussed with the patient and family. After consideration of risks, benefits and other options for treatment, the patient has consented to  Procedure(s): RIGHT TOTAL KNEE ARTHROPLASTY (Right) as a surgical intervention .  The patient's history has been reviewed, patient examined, no change in status, stable for surgery.  I have reviewed the patient's chart and labs.  Questions were answered to the patient's satisfaction.     Elsie Saas A

## 2013-10-12 NOTE — Op Note (Signed)
MRN:     [id number] DOB/AGE:    08-Oct-1965 / 48 y.o.       OPERATIVE REPORT    DATE OF PROCEDURE:  10/12/2013       PREOPERATIVE DIAGNOSIS:   DJD Right Knee      Estimated body mass index is 25.93 kg/(m^2) as calculated from the following:   Height as of 10/02/13: 5' 11.5" (1.816 m).   Weight as of 07/20/13: 85.503 kg (188 lb 8 oz).                                                        POSTOPERATIVE DIAGNOSIS:   DJD Right Knee                                                                      PROCEDURE:  Procedure(s): RIGHT TOTAL KNEE ARTHROPLASTY Using Depuy Sigma RP implants #4 Femur, #5Tibia, 12.34mm sigma RP bearing, 35 Patella     SURGEON: Lorilyn Laitinen A    ASSISTANT:  Kirstin Shepperson PA-C   (Present and scrubbed throughout the case, critical for assistance with exposure, retraction, instrumentation, and closure.)         ANESTHESIA: GET with Femoral Nerve Block  DRAINS: foley, 2 medium hemovac in knee   TOURNIQUET TIME: 59DGL   COMPLICATIONS:  None     SPECIMENS: None   INDICATIONS FOR PROCEDURE: The patient has  DJD Right Knee, varus deformities, XR shows bone on bone arthritis. Patient has failed all conservative measures including anti-inflammatory medicines, narcotics, attempts at  exercise and weight loss, cortisone injections and viscosupplementation.  Risks and benefits of surgery have been discussed, questions answered.   DESCRIPTION OF PROCEDURE: The patient identified by armband, received  right femoral nerve block and IV antibiotics, in the holding area at [facility]. Patient taken to the operating room, appropriate anesthetic  monitors were attached General endotracheal anesthesia induced with  the patient in supine position, Foley catheter was inserted. Tourniquet  applied high to the operative thigh. Lateral post and foot positioner  applied to the table, the lower extremity was then prepped and draped  in usual sterile fashion from the ankle to the  tourniquet. Time-out procedure was performed. The limb was wrapped with an Esmarch bandage and the tourniquet inflated to 365 mmHg. We began the operation by making the anterior midline incision starting at handbreadth above the patella going over the patella 1 cm medial to and  4 cm distal to the tibial tubercle. Small bleeders in the skin and the  subcutaneous tissue identified and cauterized. Transverse retinaculum was incised and reflected medially and a medial parapatellar arthrotomy was accomplished. the patella was everted and theprepatellar fat pad resected. The superficial medial collateral  ligament was then elevated from anterior to posterior along the proximal  flare of the tibia and anterior half of the menisci resected. The knee was hyperflexed exposing bone on bone arthritis. Peripheral and notch osteophytes as well as the cruciate ligaments were then resected. We continued to  work our way around posteriorly along the proximal tibia, and  externally  rotated the tibia subluxing it out from underneath the femur. A McHale  retractor was placed through the notch and a lateral Hohmann retractor  placed, and we then drilled through the proximal tibia in line with the  axis of the tibia followed by an intramedullary guide rod and 2-degree  posterior slope cutting guide. The tibial cutting guide was pinned into place  allowing resection of 4 mm of bone medially and about 6 mm of bone  laterally because of her varus deformity. Satisfied with the tibial resection, we then  entered the distal femur 2 mm anterior to the PCL origin with the  intramedullary guide rod and applied the distal femoral cutting guide  set at 79mm, with 5 degrees of valgus. This was pinned along the  epicondylar axis. At this point, the distal femoral cut was accomplished without difficulty. We then sized for a #4 femoral component and pinned the guide in 3 degrees of external rotation.The chamfer cutting guide was pinned  into place. The anterior, posterior, and chamfer cuts were accomplished without difficulty followed by  the Sigma RP box cutting guide and the box cut. We also removed posterior osteophytes from the posterior femoral condyles. At this  time, the knee was brought into full extension. We checked our  extension and flexion gaps and found them symmetric at 12.84mm.  The patella thickness measured at 26 mm. We set the cutting guide at 17 and removed the posterior 9.5-10 mm  of the patella sized for 35 button and drilled the lollipop. The knee  was then once again hyperflexed exposing the proximal tibia. We sized for a #5 tibial base plate, applied the smokestack and the conical reamer followed by the the Delta fin keel punch. We then hammered into place the Sigma RP trial femoral component, inserted a 12.5-mm trial bearing, trial patellar button, and took the knee through range of motion from 0-130 degrees. No thumb pressure was required for patellar  tracking. At this point, all trial components were removed, a double batch of DePuy HV cement with 1500 mg of Zinacef was mixed and applied to all bony metallic mating surfaces except for the posterior condyles of the femur itself. In order, we  hammered into place the tibial tray and removed excess cement, the femoral component and removed excess cement, a 12.5-mm Sigma RP bearing  was inserted, and the knee brought to full extension with compression.  The patellar button was clamped into place, and excess cement  removed. While the cement cured the wound was irrigated out with normal saline solution pulse lavage, and medium Hemovac drains were placed.. Ligament stability and patellar tracking were checked and found to be excellent. The tourniquet was then released and hemostasis was obtained with cautery. The parapatellar arthrotomy was closed with  #1 ethibond suture. The subcutaneous tissue with 0 and 2-0 undyed  Vicryl suture, and 4-0 Monocryl.. A dressing  of Xeroform,  4 x 4, dressing sponges, Webril, and Ace wrap applied. Needle and sponge count were correct times 2.The patient awakened, extubated, and taken to recovery room without difficulty. Vascular status was normal, pulses 2+ and symmetric.   Bruce Cobb A 10/12/2013, 10:49 AM

## 2013-10-12 NOTE — H&P (View-Only) (Signed)
TOTAL KNEE ADMISSION H&P  Patient is being admitted for right total knee arthroplasty.  Subjective:  Chief Complaint:right knee pain.  HPI: Bruce Cobb, 48 y.o. male, has a history of pain and functional disability in the right knee due to arthritis and has failed non-surgical conservative treatments for greater than 12 weeks to includeNSAID's and/or analgesics, corticosteriod injections, viscosupplementation injections, flexibility and strengthening excercises, supervised PT with diminished ADL's post treatment, use of assistive devices and activity modification.  Onset of symptoms was gradual, starting 10 years ago with gradually worsening course since that time. The patient noted prior procedures on the knee to include  arthroscopy, menisectomy and ACL reconstruction on the right knee(s).  Patient currently rates pain in the right knee(s) at 10 out of 10 with activity. Patient has night pain, worsening of pain with activity and weight bearing, pain that interferes with activities of daily living, crepitus and joint swelling.  Patient has evidence of subchondral sclerosis, periarticular osteophytes and joint space narrowing by imaging studies.There is no active infection.  Patient Active Problem List   Diagnosis Date Noted  . Pars defect of lumbar spine   . Right knee DJD    Past Medical History  Diagnosis Date  . Pars defect of lumbar spine   . Right knee DJD   . PONV (postoperative nausea and vomiting)     Past Surgical History  Procedure Laterality Date  . Shoulder surgery    . Knee surgery    . Ankle surgery    . Nasal septum surgery       (Not in a hospital admission) No Known Allergies  History  Substance Use Topics  . Smoking status: Never Smoker   . Smokeless tobacco: Not on file  . Alcohol Use: Yes     Comment: wine daily    Family History  Problem Relation Age of Onset  . Cancer - Prostate Father      Review of Systems  Constitutional: Negative.   HENT:  Negative.   Eyes: Negative.   Respiratory: Negative.   Cardiovascular: Negative.   Gastrointestinal: Negative.   Genitourinary: Negative.   Musculoskeletal: Positive for back pain and joint pain.  Skin: Negative.   Neurological: Negative.   Endo/Heme/Allergies: Negative.   Psychiatric/Behavioral: Negative.     Objective:  Physical Exam  Constitutional: He is oriented to person, place, and time. He appears well-developed and well-nourished.  HENT:  Head: Normocephalic and atraumatic.  Mouth/Throat: Oropharynx is clear and moist.  Eyes: Conjunctivae are normal. Pupils are equal, round, and reactive to light.  Neck: Neck supple.  Cardiovascular: Normal rate, regular rhythm and intact distal pulses.   Respiratory: Effort normal.  GI: Soft.  Genitourinary:  Not pertinent to current symptomatology therefore not examined.  Musculoskeletal:  Right knee has active range of motion -5 to 120 degrees 2+ crepitus 2+ synovitis normal patella tracking peripatellar pain and lateral joint line tenderness. Left knee has full range of motion without pain swelling weakness or instability. He is alert and oriented. He has low back pain but no radicular pain.   Neurological: He is alert and oriented to person, place, and time.  Skin: Skin is warm and dry.  Psychiatric: He has a normal mood and affect. His behavior is normal.    Vital signs in last 24 hours: Last recorded: 01/28 1500   BP: 125/79    Temp: 98.1 F (36.7 C)    Height: 5' 11" (1.803 m) SpO2: 95  Weight: 90.719 kg (200 lb)       Labs:   Estimated body mass index is 27.91 kg/(m^2) as calculated from the following:   Height as of this encounter: 5\' 11"  (1.803 m).   Weight as of this encounter: 90.719 kg (200 lb).   Imaging Review Plain radiographs demonstrate severe degenerative joint disease of the right knee(s). The overall alignment issignificant varus. The bone quality appears to be good for age and reported activity  level.  Assessment/Plan:  End stage arthritis, right knee   The patient history, physical examination, clinical judgment of the provider and imaging studies are consistent with end stage degenerative joint disease of the right knee(s) and total knee arthroplasty is deemed medically necessary. The treatment options including medical management, injection therapy arthroscopy and arthroplasty were discussed at length. The risks and benefits of total knee arthroplasty were presented and reviewed. The risks due to aseptic loosening, infection, stiffness, patella tracking problems, thromboembolic complications and other imponderables were discussed. The patient acknowledged the explanation, agreed to proceed with the plan and consent was signed. Patient is being admitted for inpatient treatment for surgery, pain control, PT, OT, prophylactic antibiotics, VTE prophylaxis, progressive ambulation and ADL's and discharge planning. The patient is planning to be discharged home with home health services  Petrona Wyeth A. Kaleen Mask Physician Assistant [facility] [phone]  09/30/2013, 3:17 PM

## 2013-10-12 NOTE — Evaluation (Signed)
Physical Therapy Evaluation Patient Details Name: Mekel Haverstock MRN: [id number] DOB: 1965-10-10 Today's Date: 10/12/2013 Time: 0300-9233 PT Time Calculation (min): 32 min  PT Assessment / Plan / Recommendation History of Present Illness  48 y.o. male admitted to [facility] on 10/12/13 for elective R TKA.    Clinical Impression  Pt is POD #1 and is moving very well.  He should progress well enough to go home after his AM session tomorrow once he has reviewed 2 STE his home.      PT Assessment  Patient needs continued PT services    Follow Up Recommendations  Home health PT    Does the patient have the potential to tolerate intense rehabilitation     NA  Barriers to Discharge   None      Equipment Recommendations  Rolling walker with 5" wheels    Recommendations for Other Services   None  Frequency 7X/week    Precautions / Restrictions Precautions Precautions: Knee Required Braces or Orthoses: Knee Immobilizer - Right Knee Immobilizer - Right: Discontinue once straight leg raise with < 10 degree lag Restrictions Weight Bearing Restrictions: No RLE Weight Bearing: Weight bearing as tolerated   Pertinent Vitals/Pain See vitals flow sheet.       Mobility  Bed Mobility Overal bed mobility: Needs Assistance Bed Mobility: Supine to Sit Supine to sit: Min assist General bed mobility comments: Min assist to support right leg while pt moves to sitting EOB.  Transfers Overall transfer level: Needs assistance Transfers: Sit to/from Stand Sit to Stand: Min assist General transfer comment: min assist to support trunk during transitions with verbal cues for safe hand placement.  Ambulation/Gait Ambulation/Gait assistance: Min assist Ambulation Distance (Feet): 175 Feet Assistive device: Rolling walker (2 wheeled) Gait Pattern/deviations: Step-through pattern;Ataxic General Gait Details: Verbal cues for correct lower extremity sequencing, brace use and WB status of right leg.       Exercises Total Joint Exercises Ankle Circles/Pumps: AROM;Both;10 reps;Seated   PT Diagnosis: Difficulty walking;Abnormality of gait;Generalized weakness;Acute pain  PT Problem List: Decreased strength;Decreased range of motion;Decreased activity tolerance;Decreased balance;Decreased mobility;Decreased knowledge of use of DME;Decreased knowledge of precautions;Pain PT Treatment Interventions: DME instruction;Gait training;Stair training;Functional mobility training;Therapeutic activities;Therapeutic exercise;Balance training;Patient/family education;Modalities     PT Goals(Current goals can be found in the care plan section) Acute Rehab PT Goals Patient Stated Goal: to go home tomorrow.   PT Goal Formulation: With patient/family Time For Goal Achievement: 10/19/13 Potential to Achieve Goals: Good  Visit Information  Last PT Received On: 10/12/13 Assistance Needed: +1 History of Present Illness: 48 y.o. male admitted to [facility] on 10/12/13 for elective R TKA.         Prior [place] expects to be discharged to:: Private residence Living Arrangements: Spouse/significant other Available Help at Discharge: Family (70, 81, and 9 y.o. 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  Pt able to wiggle toes and appers to have normal sensation.  CPM take off and was set on 0-60 degrees.   Cervical / Trunk Assessment Cervical / Trunk Assessment: Normal      End of Session PT - End of Session Equipment Utilized During Treatment: Gait belt Activity Tolerance: Patient tolerated treatment well Patient left: in chair;with call Muckey/phone within reach;with family/visitor present Nurse Communication: Mobility status CPM Right Knee CPM Right Knee: Off Right Knee Flexion (Degrees): 60 Right Knee Extension (Degrees): 0    [place]assnacht B. Henderson, [place], DPT [phone]   10/12/2013, 6:40 PM

## 2013-10-12 NOTE — Progress Notes (Signed)
Orthopedic Tech Progress Note Patient Details:  Bruce Cobb April 30, 1966 [id number]  Patient ID: Bruce Cobb, male   DOB: 11/28/1965, 48 y.o.   MRN: [id number] Placed pt's rle in cpm @ 0-65 degrees @1955   Hildred Priest 10/12/2013, 7:56 PM

## 2013-10-12 NOTE — Anesthesia Procedure Notes (Addendum)
Anesthesia Regional Block:  Femoral nerve block  Pre-Anesthetic Checklist: ,, timeout performed, Correct Patient, Correct Site, Correct Laterality, Correct Procedure, Correct Position, site marked, Risks and benefits discussed,  Surgical consent,  Pre-op evaluation,  At surgeon's request and post-op pain management  Laterality: Right  Prep: chloraprep       Needles:  Injection technique: Single-shot  Needle Type: Echogenic Stimulator Needle     Needle Length: 5cm 5 cm Needle Gauge: 22 and 22 G    Additional Needles:  Procedures: ultrasound guided (picture in chart) and nerve stimulator Femoral nerve block  Nerve Stimulator or Paresthesia:  Response: quadraceps contraction, 0.45 mA,   Additional Responses:   Narrative:  Start time: 10/12/2013 8:31 AM End time: 10/12/2013 8:41 AM Injection made incrementally with aspirations every 5 mL.  Performed by: Personally  Anesthesiologist: Earnest Bailey, MD  Additional Notes: Functioning IV was confirmed and monitors were applied.  A 34mm 22ga Arrow echogenic stimulator needle was used. Sterile prep and drape,hand hygiene and sterile gloves were used. Ultrasound guidance: relevant anatomy identified, needle position confirmed, local anesthetic spread visualized around nerve(s)., vascular puncture avoided.  Image printed for medical record. Negative aspiration and negative test dose prior to incremental administration of local anesthetic. The patient tolerated the procedure well.     Procedure Name: LMA Insertion Date/Time: 10/12/2013 9:23 AM Performed by: Neldon Newport Pre-anesthesia Checklist: Timeout performed, Patient identified, Emergency Drugs available, Suction available and Patient being monitored Patient Re-evaluated:Patient Re-evaluated prior to inductionOxygen Delivery Method: Circle system utilized Preoxygenation: Pre-oxygenation with 100% oxygen Intubation Type: IV induction Ventilation: Mask ventilation without  difficulty LMA: LMA inserted LMA Size: 4.0 Tube type: Oral Number of attempts: 1 Placement Confirmation: positive ETCO2,  ETT inserted through vocal cords under direct vision and breath sounds checked- equal and bilateral Tube secured with: Tape Dental Injury: Teeth and Oropharynx as per pre-operative assessment

## 2013-10-12 NOTE — Transfer of Care (Signed)
Immediate Anesthesia Transfer of Care Note  Patient: Bruce Cobb  Procedure(s) Performed: Procedure(s): RIGHT TOTAL KNEE ARTHROPLASTY (Right)  Patient Location: PACU  Anesthesia Type:General  Level of Consciousness: awake, alert  and oriented  Airway & Oxygen Therapy: Patient Spontanous Breathing and Patient connected to nasal cannula oxygen  Post-op Assessment: Report given to PACU RN, Post -op Vital signs reviewed and stable and Patient moving all extremities X 4  Post vital signs: Reviewed and stable  Complications: No apparent anesthesia complications

## 2013-10-12 NOTE — Anesthesia Preprocedure Evaluation (Signed)
Anesthesia Evaluation  Patient identified by MRN, date of birth, ID band Patient awake    Reviewed: Allergy & Precautions, H&P , NPO status , Patient's Chart, lab work & pertinent test results  History of Anesthesia Complications (+) PONV and history of anesthetic complications  Airway Mallampati: II TM Distance: >3 FB     Dental  (+) Teeth Intact and Dental Advidsory Given   Pulmonary          Cardiovascular     Neuro/Psych    GI/Hepatic   Endo/Other    Renal/GU      Musculoskeletal   Abdominal   Peds  Hematology   Anesthesia Other Findings   Reproductive/Obstetrics                           Anesthesia Physical Anesthesia Plan  ASA: II  Anesthesia Plan:    Post-op Pain Management:    Induction:   Airway Management Planned:   Additional Equipment:   Intra-op Plan:   Post-operative Plan:   Informed Consent:   Dental Advisory Given  Plan Discussed with: Anesthesiologist, CRNA and Surgeon  Anesthesia Plan Comments:         Anesthesia Quick Evaluation

## 2013-10-13 LAB — CBC
HCT: 32.9 % — ABNORMAL LOW (ref 39.0–52.0)
HEMOGLOBIN: 11.9 g/dL — AB (ref 13.0–17.0)
MCH: 31.8 pg (ref 26.0–34.0)
MCHC: 36.2 g/dL — ABNORMAL HIGH (ref 30.0–36.0)
MCV: 88 fL (ref 78.0–100.0)
PLATELETS: 237 10*3/uL (ref 150–400)
RBC: 3.74 MIL/uL — ABNORMAL LOW (ref 4.22–5.81)
RDW: 11.8 % (ref 11.5–15.5)
WBC: 9.3 10*3/uL (ref 4.0–10.5)

## 2013-10-13 LAB — BASIC METABOLIC PANEL
BUN: 16 mg/dL (ref 6–23)
CALCIUM: 8.3 mg/dL — AB (ref 8.4–10.5)
CHLORIDE: 101 meq/L (ref 96–112)
CO2: 24 meq/L (ref 19–32)
Creatinine, Ser: 0.87 mg/dL (ref 0.50–1.35)
GFR calc Af Amer: >90 mL/min (ref >90)
GFR calc non Af Amer: >90 mL/min (ref >90)
Glucose, Bld: 166 mg/dL — ABNORMAL HIGH (ref 70–99)
POTASSIUM: 4.4 meq/L (ref 3.7–5.3)
SODIUM: 138 meq/L (ref 137–147)

## 2013-10-13 MED ORDER — DSS 100 MG PO CAPS: ORAL_CAPSULE | ORAL | Status: DC

## 2013-10-13 MED ORDER — CELECOXIB 200 MG PO CAPS: ORAL_CAPSULE | ORAL | Status: DC

## 2013-10-13 MED ORDER — BISACODYL 5 MG PO TBEC: DELAYED_RELEASE_TABLET | ORAL | Status: DC

## 2013-10-13 MED ORDER — ASPIRIN 325 MG PO TBEC: DELAYED_RELEASE_TABLET | ORAL | Status: DC

## 2013-10-13 MED ORDER — OXYCODONE HCL 5 MG PO TABS: ORAL_TABLET | ORAL | Status: DC

## 2013-10-13 MED ORDER — ACETAMINOPHEN 325 MG PO TABS: 650.0000 mg | ORAL_TABLET | Freq: Four times a day (QID) | ORAL | Status: DC | PRN

## 2013-10-13 NOTE — Discharge Summary (Signed)
Patient ID: Bruce Cobb MRN: [id number] DOB/AGE: [birth year] 48 y.o.  Admit date: 10/12/2013 Discharge date: 10/13/2013  Admission Diagnoses:  Principal Problem:   Right knee DJD Active Problems:   Pars defect of lumbar spine   DJD (degenerative joint disease) of knee   Discharge Diagnoses:  Same  Past Medical History  Diagnosis Date  . Pars defect of lumbar spine   . Right knee DJD   . PONV (postoperative nausea and vomiting)   . Shingles     Surgeries: Procedure(s): RIGHT TOTAL KNEE ARTHROPLASTY on 10/12/2013   Consultants:    Discharged Condition: Improved  Hospital Course: Bruce Cobb is an 48 y.o. male who was admitted 10/12/2013 for operative treatment ofRight knee DJD. Patient has severe unremitting pain that affects sleep, daily activities, and work/hobbies. After pre-op clearance the patient was taken to the operating room on 10/12/2013 and underwent  Procedure(s): RIGHT TOTAL KNEE ARTHROPLASTY.    Patient was given perioperative antibiotics: Anti-infectives   Start     Dose/Rate Route Frequency Ordered Stop   10/12/13 1530  ceFAZolin (ANCEF) IVPB 2 g/50 mL premix     2 g 100 mL/hr over 30 Minutes Intravenous Every 6 hours 10/12/13 1431 10/12/13 2352   10/12/13 1024  cefUROXime (ZINACEF) injection  Status:  Discontinued       As needed 10/12/13 1024 10/12/13 1132   10/12/13 0600  ceFAZolin (ANCEF) IVPB 2 g/50 mL premix     2 g 100 mL/hr over 30 Minutes Intravenous On call to O.R. 10/11/13 1422 10/12/13 0917       Patient was given sequential compression devices, early ambulation, and chemoprophylaxis to prevent DVT.  Patient benefited maximally from hospital stay and there were no complications.    Recent vital signs: Patient Vitals for the past 24 hrs:  BP Temp Pulse Resp SpO2  10/13/13 0621 148/86 mmHg 98.7 F (37.1 C) 72 18 99 %  10/12/13 2102 146/85 mmHg 98.9 F (37.2 C) 77 18 99 %  10/12/13 1600 - - - 18 -  10/12/13 1345 - 97 F (36.1 C) - - -  10/12/13  1338 156/88 mmHg - 70 12 98 %  10/12/13 1323 148/76 mmHg - 79 9 100 %  10/12/13 1315 - - 73 16 100 %  10/12/13 1308 130/76 mmHg - 83 17 100 %  10/12/13 1253 142/76 mmHg - 71 6 99 %  10/12/13 1238 149/79 mmHg - 77 10 100 %  10/12/13 1223 150/80 mmHg - 75 10 100 %  10/12/13 1208 155/85 mmHg - 76 11 100 %  10/12/13 1153 166/101 mmHg - 93 18 100 %  10/12/13 1145 - 98.6 F (37 C) - - -  10/12/13 1138 158/103 mmHg - 101 16 100 %     Recent laboratory studies:  Recent Labs  10/13/13 0448  WBC 9.3  HGB 11.9*  HCT 32.9*  PLT 237  NA 138  K 4.4  CL 101  CO2 24  BUN 16  CREATININE 0.87  GLUCOSE 166*  CALCIUM 8.3*     Discharge Medications:     Medication List    STOP taking these medications       naproxen sodium 220 MG tablet  Commonly known as:  ANAPROX      TAKE these medications       acetaminophen 325 MG tablet  Commonly known as:  TYLENOL  Take 2 tablets (650 mg total) by mouth every 6 (six) hours as needed for mild pain (or Fever >/=  101).     aspirin 325 MG EC tablet  1 tab a day for the next 30 days to prevent blood clots     bisacodyl 5 MG EC tablet  Commonly known as:  DULCOLAX  Take 2 tablets every night with dinner until bowel movement.  LAXITIVE.  Restart if two days since last bowel movement     celecoxib 200 MG capsule  Commonly known as:  CELEBREX  1 tab po q day with food for pain and  swelling     DSS 100 MG Caps  1 tab 2 times a day while on narcotics.  STOOL SOFTENER     oxyCODONE 5 MG immediate release tablet  Commonly known as:  Oxy IR/ROXICODONE  1-2 tablets every 4-6 hrs as needed for pain        Diagnostic Studies: Dg Chest 2 View  10/12/2013   CLINICAL DATA:  Preop evaluation for knee replacement  EXAM: CHEST  2 VIEW  COMPARISON:  None  FINDINGS: Normal heart size, mediastinal contours, and pulmonary vascularity.  Peribronchial thickening without infiltrate, pleural effusion or pneumothorax.  No acute osseous findings.  IMPRESSION:  Minimal bronchitic changes without infiltrate.   Electronically Signed   By: Lavonia Dana M.D.   On: 10/12/2013 07:51   Dg Knee Right Port  10/12/2013   CLINICAL DATA:  Osteoarthritis of the right knee  EXAM: PORTABLE RIGHT KNEE - 1-2 VIEW  COMPARISON:  None.  FINDINGS: The right knee demonstrates a total knee arthroplasty without evidence of hardware failure complication. There is no significant joint effusion. There is no fracture or dislocation. The alignment is anatomic. Surgical drains are present. Post-surgical changes noted in the surrounding soft tissues.  IMPRESSION: Right total knee arthroplasty.   Electronically Signed   By: Kathreen Devoid   On: 10/12/2013 13:34    Disposition: 01-Home or Self Care      Discharge Orders   Future Orders Complete By Expires   Call MD / Call 911  As directed    Comments:     If you experience chest pain or shortness of breath, CALL 911 and be transported to the hospital emergency room.  If you develope a fever above 101 F, pus (white drainage) or increased drainage or redness at the wound, or calf pain, call your surgeon's office.   Change dressing  As directed    Comments:     Change the dressing daily with sterile 4 x 4 inch gauze dressing and apply TED hose.  You may clean the incision with alcohol prior to redressing.   Constipation Prevention  As directed    Comments:     Drink plenty of fluids.  Prune juice may be helpful.  You may use a stool softener, such as Colace (over the counter) 100 mg twice a day.  Use MiraLax (over the counter) for constipation as needed.   CPM  As directed    Comments:     Continuous passive motion machine (CPM):      Use the CPM from 0 to 90 for 6 hours per day.       You may break it up into 2 or 3 sessions per day.      Use CPM for 2 weeks or until you are told to stop.   Diet - low sodium heart healthy  As directed    Discharge instructions  As directed    Comments:     Total Knee Replacement Care After Refer  to this sheet in the next few weeks. These discharge instructions provide you with general information on caring for yourself after you leave the hospital. Your caregiver may also give you specific instructions. Your treatment has been planned according to the most current medical practices available, but unavoidable complications sometimes occur. If you have any problems or questions after discharge, please call your caregiver. Regaining a near full range of motion of your knee within the first 3 to 6 weeks after surgery is critical. [place] may resume a normal diet and activities as directed.  Perform exercises as directed.  Place gray foam block, curve side up under heel at all times except when in CPM or when walking.  DO NOT modify, tear, cut, or change in any way the gray foam block. You will receive physical therapy daily  Take showers instead of baths until informed otherwise.  You may shower on Sunday.  Please wash whole leg including wound with soap and water  Change bandages (dressings)daily It is OK to take over-the-counter tylenol in addition to the oxycodone for pain, discomfort, or fever. Oxycodone is VERY constipating.  Please take stool softener twice a day and laxatives daily until bowels are regular Eat a well-balanced diet.  Avoid lifting or driving until you are instructed otherwise.  Make an appointment to see your caregiver for stitches (suture) or staple removal as directed.  If you have been sent home with a continuous passive motion machine (CPM machine), 0-90 degrees 6 hrs a day   2 hrs a shift SEEK MEDICAL CARE IF: You have swelling of your calf or leg.  You develop shortness of breath or chest pain.  You have redness, swelling, or increasing pain in the wound.  There is pus or any unusual drainage coming from the surgical site.  You notice a bad smell coming from the surgical site or dressing.  The surgical site breaks open after sutures or staples  have been removed.  There is persistent bleeding from the suture or staple line.  You are getting worse or are not improving.  You have any other questions or concerns.  SEEK IMMEDIATE MEDICAL CARE IF:  You have a fever.  You develop a rash.  You have difficulty breathing.  You develop any reaction or side effects to medicines given.  Your knee motion is decreasing rather than improving.  MAKE SURE YOU:  Understand these instructions.  Will watch your condition.  Will get help right away if you are not doing well or get worse.   Do not put a pillow under the knee. Place it under the heel.  As directed    Comments:     Place gray foam block, curve side up under heel at all times except when in CPM or when walking.  DO NOT modify, tear, cut, or change in any way the gray foam block.   Increase activity slowly as tolerated  As directed    TED hose  As directed    Comments:     Use stockings (TED hose) for 2 weeks on both leg(s).  You may remove them at night for sleeping.      Follow-up Information   Follow up with Lorn Junes, MD On 10/26/2013. (appt time 3:15pm)    Specialty:  Orthopedic Surgery   Contact information:   [place] [address] [phone]        Signed: Linda Hedges 10/13/2013, 9:11 AM

## 2013-10-13 NOTE — Care Management Note (Signed)
CARE MANAGEMENT NOTE 10/13/2013  Patient:  Bruce Cobb,Bruce Cobb   Account Number:  [id number]  Date Initiated:  10/13/2013  Documentation initiated by:  Ricki Miller  Subjective/Objective Assessment:   48 yr old male s/p right total knee arthroplasty.     Action/Plan:   Case manager spoke with patient concerning home health and DME needs at discharge. patient states he will be seeing someone from [facility]. Rolling walker,3in1 and CPM have been delivered   Anticipated DC Date:  10/13/2013   Anticipated DC Plan:  [place]  CM consult      PAC Choice  DURABLE MEDICAL EQUIPMENT   Choice offered to / List presented to:  C-1 Patient   DME arranged  CPM  WALKER - ROLLING  3-N-1      DME agency  [employer]     HH arranged  NA      Status of service:  Completed, signed off Medicare Important Message given?   (If response is "NO", the following Medicare IM given date fields will be blank) Date Medicare IM given:   Date Additional Medicare IM given:    Discharge Disposition:  HOME/SELF CARE

## 2013-10-13 NOTE — Progress Notes (Signed)
Physical Therapy Treatment Patient Details Name: Bruce Cobb MRN: [id number] DOB: [birth year] Today's Date: 10/13/2013 Time: 0735-0800 PT Time Calculation (min): 25 min  PT Assessment / Plan / Recommendation  History of Present Illness 48 y.o. male admitted to [facility] on 10/12/13 for elective R TKA.     PT Comments   Pt performed stair negotiation and gait with crutches with wife present, supervision level.  Pt/wife state they feel comfortable with pt d/c home today.  Reviewed HEP, precautions and knee immobilizer with pt/wife who express understanding.  Follow Up Recommendations  Outpatient PT     Does the patient have the potential to tolerate intense rehabilitation     Barriers to Discharge        Equipment Recommendations  None recommended by PT    Recommendations for Other Services    Frequency 7X/week   Progress towards PT Goals Progress towards PT goals: Progressing toward goals  Plan Current plan remains appropriate    Precautions / Restrictions Precautions Precautions: Knee Required Braces or Orthoses: Knee Immobilizer - Right Knee Immobilizer - Right: Discontinue once straight leg raise with < 10 degree lag Restrictions Weight Bearing Restrictions: Yes RLE Weight Bearing: Weight bearing as tolerated   Pertinent Vitals/Pain No c/o pain    Mobility  Bed Mobility Supine to sit: Supervision General bed mobility comments: cues to don KI before getting up Transfers Equipment used: Crutches Transfers: Sit to/from [employer] to Stand: Supervision Ambulation/Gait Ambulation/Gait assistance: Supervision Ambulation Distance (Feet): 200 Feet Assistive device: Crutches General Gait Details: pt states he will use crutches at home.  step through pattern with good wt bearing on R LE Stairs: Yes Stairs assistance: Supervision Stair Management: One rail Left;With crutches Number of Stairs: 5 General stair comments: pt performed 5 stairs with crutches followed by 5 stairs with 1  handrail to simulate stairs inside and outside home.  pt supervision, requires cues for sequencing. pt's wife present and states she feels comfortable with pt negotiating stairs at home    Exercises     PT Diagnosis:    PT Problem List:   PT Treatment Interventions:     PT Goals (current goals can now be found in the care plan section)    Visit Information  Last PT Received On: 10/13/13 Assistance Needed: +1 History of Present Illness: 48 y.o. male admitted to [facility] on 10/12/13 for elective R TKA.      Subjective Data      Cognition  Cognition Arousal/Alertness: Awake/alert Behavior During Therapy: WFL for tasks assessed/performed Overall Cognitive Status: Within Functional Limits for tasks assessed    Balance     End of Session PT - End of Session Equipment Utilized During Treatment: Right knee immobilizer Activity Tolerance: Patient tolerated treatment well Patient left: in chair;with call [place]/phone within reach;with family/visitor present Nurse Communication: Mobility status CPM Right Knee CPM Right Knee: Off   GP     Bruce Cobb 10/13/2013, 11:33 AM

## 2013-10-15 ENCOUNTER — Encounter (HOSPITAL_COMMUNITY): Payer: Self-pay | Admitting: Orthopedic Surgery

## 2013-10-15 NOTE — Discharge Summary (Signed)
Patient ID: Bruce Cobb MRN: [id number] DOB/AGE: 09-24-1965 48 y.o.  Admit date: 10/12/2013 Discharge date: 10/15/2013  Admission Diagnoses:  Principal Problem:   Right knee DJD Active Problems:   Pars defect of lumbar spine   DJD (degenerative joint disease) of knee   Discharge Diagnoses:  Same  Past Medical History  Diagnosis Date  . Pars defect of lumbar spine   . Right knee DJD   . PONV (postoperative nausea and vomiting)   . Shingles     Surgeries: Procedure(s): RIGHT TOTAL KNEE ARTHROPLASTY on 10/12/2013   Consultants:    Discharged Condition: Improved  Hospital Course: Bruce Cobb is an 48 y.o. male who was admitted 10/12/2013 for operative treatment ofRight knee DJD. Patient has severe unremitting pain that affects sleep, daily activities, and work/hobbies. After pre-op clearance the patient was taken to the operating room on 10/12/2013 and underwent  Procedure(s): RIGHT TOTAL KNEE ARTHROPLASTY.    Patient was given perioperative antibiotics: Anti-infectives   Start     Dose/Rate Route Frequency Ordered Stop   10/12/13 1530  ceFAZolin (ANCEF) IVPB 2 g/50 mL premix     2 g 100 mL/hr over 30 Minutes Intravenous Every 6 hours 10/12/13 1431 10/12/13 2352   10/12/13 1024  cefUROXime (ZINACEF) injection  Status:  Discontinued       As needed 10/12/13 1024 10/12/13 1132   10/12/13 0600  ceFAZolin (ANCEF) IVPB 2 g/50 mL premix     2 g 100 mL/hr over 30 Minutes Intravenous On call to O.R. 10/11/13 1422 10/12/13 0917       Patient was given sequential compression devices, early ambulation, and chemoprophylaxis to prevent DVT.  Patient benefited maximally from hospital stay and there were no complications.    Recent vital signs: No data found.    Recent laboratory studies:  Recent Labs  10/13/13 0448  WBC 9.3  HGB 11.9*  HCT 32.9*  PLT 237  NA 138  K 4.4  CL 101  CO2 24  BUN 16  CREATININE 0.87  GLUCOSE 166*  CALCIUM 8.3*     Discharge Medications:      Medication List    STOP taking these medications       naproxen sodium 220 MG tablet  Commonly known as:  ANAPROX      TAKE these medications       acetaminophen 325 MG tablet  Commonly known as:  TYLENOL  Take 2 tablets (650 mg total) by mouth every 6 (six) hours as needed for mild pain (or Fever >/= 101).     aspirin 325 MG EC tablet  1 tab a day for the next 30 days to prevent blood clots     bisacodyl 5 MG EC tablet  Commonly known as:  DULCOLAX  Take 2 tablets every night with dinner until bowel movement.  LAXITIVE.  Restart if two days since last bowel movement     celecoxib 200 MG capsule  Commonly known as:  CELEBREX  1 tab po q day with food for pain and  swelling     DSS 100 MG Caps  1 tab 2 times a day while on narcotics.  STOOL SOFTENER     oxyCODONE 5 MG immediate release tablet  Commonly known as:  Oxy IR/ROXICODONE  1-2 tablets every 4-6 hrs as needed for pain        Diagnostic Studies: Dg Chest 2 View  10/12/2013   CLINICAL DATA:  Preop evaluation for knee replacement  EXAM: CHEST  2 VIEW  COMPARISON:  None  FINDINGS: Normal heart size, mediastinal contours, and pulmonary vascularity.  Peribronchial thickening without infiltrate, pleural effusion or pneumothorax.  No acute osseous findings.  IMPRESSION: Minimal bronchitic changes without infiltrate.   Electronically Signed   By: Ulyses Southward M.D.   On: 10/12/2013 07:51   Dg Knee Right Port  10/12/2013   CLINICAL DATA:  Osteoarthritis of the right knee  EXAM: PORTABLE RIGHT KNEE - 1-2 VIEW  COMPARISON:  None.  FINDINGS: The right knee demonstrates a total knee arthroplasty without evidence of hardware failure complication. There is no significant joint effusion. There is no fracture or dislocation. The alignment is anatomic. Surgical drains are present. Post-surgical changes noted in the surrounding soft tissues.  IMPRESSION: Right total knee arthroplasty.   Electronically Signed   By: Elige Ko   On:  10/12/2013 13:34    Disposition: 06-Home-Health Care Svc      Discharge Orders   Future Orders Complete By Expires   Call MD / Call 911  As directed    Comments:     If you experience chest pain or shortness of breath, CALL 911 and be transported to the hospital emergency room.  If you develope a fever above 101 F, pus (white drainage) or increased drainage or redness at the wound, or calf pain, call your surgeon's office.   Change dressing  As directed    Comments:     Change the dressing daily with sterile 4 x 4 inch gauze dressing and apply TED hose.  You may clean the incision with alcohol prior to redressing.   Constipation Prevention  As directed    Comments:     Drink plenty of fluids.  Prune juice may be helpful.  You may use a stool softener, such as Colace (over the counter) 100 mg twice a day.  Use MiraLax (over the counter) for constipation as needed.   CPM  As directed    Comments:     Continuous passive motion machine (CPM):      Use the CPM from 0 to 90 for 6 hours per day.       You may break it up into 2 or 3 sessions per day.      Use CPM for 2 weeks or until you are told to stop.   Diet - low sodium heart healthy  As directed    Discharge instructions  As directed    Comments:     Total Knee Replacement Care After Refer to this sheet in the next few weeks. These discharge instructions provide you with general information on caring for yourself after you leave the hospital. Your caregiver may also give you specific instructions. Your treatment has been planned according to the most current medical practices available, but unavoidable complications sometimes occur. If you have any problems or questions after discharge, please call your caregiver. Regaining a near full range of motion of your knee within the first 3 to 6 weeks after surgery is critical. HOME CARE INSTRUCTIONS  You may resume a normal diet and activities as directed.  Perform exercises as directed.   Place gray foam block, curve side up under heel at all times except when in CPM or when walking.  DO NOT modify, tear, cut, or change in any way the gray foam block. You will receive physical therapy daily  Take showers instead of baths until informed otherwise.  You may shower on Sunday.  Please wash whole leg including  wound with soap and water  Change bandages (dressings)daily It is OK to take over-the-counter tylenol in addition to the oxycodone for pain, discomfort, or fever. Oxycodone is VERY constipating.  Please take stool softener twice a day and laxatives daily until bowels are regular Eat a well-balanced diet.  Avoid lifting or driving until you are instructed otherwise.  Make an appointment to see your caregiver for stitches (suture) or staple removal as directed.  If you have been sent home with a continuous passive motion machine (CPM machine), 0-90 degrees 6 hrs a day   2 hrs a shift SEEK MEDICAL CARE IF: You have swelling of your calf or leg.  You develop shortness of breath or chest pain.  You have redness, swelling, or increasing pain in the wound.  There is pus or any unusual drainage coming from the surgical site.  You notice a bad smell coming from the surgical site or dressing.  The surgical site breaks open after sutures or staples have been removed.  There is persistent bleeding from the suture or staple line.  You are getting worse or are not improving.  You have any other questions or concerns.  SEEK IMMEDIATE MEDICAL CARE IF:  You have a fever.  You develop a rash.  You have difficulty breathing.  You develop any reaction or side effects to medicines given.  Your knee motion is decreasing rather than improving.  MAKE SURE YOU:  Understand these instructions.  Will watch your condition.  Will get help right away if you are not doing well or get worse.   Do not put a pillow under the knee. Place it under the heel.  As directed    Comments:     Place gray  foam block, curve side up under heel at all times except when in CPM or when walking.  DO NOT modify, tear, cut, or change in any way the gray foam block.   Increase activity slowly as tolerated  As directed    TED hose  As directed    Comments:     Use stockings (TED hose) for 2 weeks on both leg(s).  You may remove them at night for sleeping.      Follow-up Information   Follow up with Lorn Junes, MD On 10/26/2013. (appt time 3:15pm)    Specialty:  Orthopedic Surgery   Contact information:   [address] [place] [address] [phone]        Signed: Linda Hedges 10/15/2013, 8:08 AM

## 2013-12-31 ENCOUNTER — Other Ambulatory Visit: Payer: Self-pay | Admitting: Orthopedic Surgery

## 2013-12-31 DIAGNOSIS — M545 Low back pain, unspecified: Secondary | ICD-10-CM

## 2014-01-07 ENCOUNTER — Other Ambulatory Visit: Payer: BC Managed Care – PPO

## 2014-11-06 ENCOUNTER — Encounter (HOSPITAL_BASED_OUTPATIENT_CLINIC_OR_DEPARTMENT_OTHER): Payer: Self-pay | Admitting: *Deleted

## 2014-11-06 ENCOUNTER — Emergency Department (HOSPITAL_BASED_OUTPATIENT_CLINIC_OR_DEPARTMENT_OTHER)
Admission: EM | Admit: 2014-11-06 | Discharge: 2014-11-06 | Disposition: A | Discharge status: Home / Self Care | Payer: No Typology Code available for payment source | Attending: Emergency Medicine | Admitting: Emergency Medicine

## 2014-11-06 DIAGNOSIS — S299XXA Unspecified injury of thorax, initial encounter: Secondary | ICD-10-CM | POA: Insufficient documentation

## 2014-11-06 DIAGNOSIS — Y92410 Unspecified street and highway as the place of occurrence of the external cause: Secondary | ICD-10-CM | POA: Diagnosis not present

## 2014-11-06 DIAGNOSIS — Z8739 Personal history of other diseases of the musculoskeletal system and connective tissue: Secondary | ICD-10-CM | POA: Diagnosis not present

## 2014-11-06 DIAGNOSIS — S199XXA Unspecified injury of neck, initial encounter: Secondary | ICD-10-CM | POA: Insufficient documentation

## 2014-11-06 DIAGNOSIS — Z8619 Personal history of other infectious and parasitic diseases: Secondary | ICD-10-CM | POA: Insufficient documentation

## 2014-11-06 DIAGNOSIS — Y998 Other external cause status: Secondary | ICD-10-CM | POA: Insufficient documentation

## 2014-11-06 DIAGNOSIS — Y9389 Activity, other specified: Secondary | ICD-10-CM | POA: Insufficient documentation

## 2014-11-06 DIAGNOSIS — Z791 Long term (current) use of non-steroidal anti-inflammatories (NSAID): Secondary | ICD-10-CM | POA: Diagnosis not present

## 2014-11-06 DIAGNOSIS — Z79899 Other long term (current) drug therapy: Secondary | ICD-10-CM | POA: Insufficient documentation

## 2014-11-06 DIAGNOSIS — M542 Cervicalgia: Secondary | ICD-10-CM

## 2014-11-06 MED ORDER — CYCLOBENZAPRINE HCL 10 MG PO TABS: 10.0000 mg | ORAL_TABLET | Freq: Two times a day (BID) | ORAL | Status: DC | PRN

## 2014-11-06 MED ORDER — IBUPROFEN 800 MG PO TABS: 800.0000 mg | ORAL_TABLET | Freq: Three times a day (TID) | ORAL | Status: DC

## 2014-11-06 NOTE — Discharge Instructions (Signed)
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 Substance Abuse Program  [phone]   Psychological Services Organization         Address  Phone  Notes  [facility] [place]  [place]  [phone]   [place] [address] [place] or [phone]    Mobile Crisis Teams Organization         Address  Phone  Notes  [facility], Mobile Crisis Care Unit  [phone]   [facility]  [address] [place], [place]   [place] [address] [place] [phone]    Self-Help/Support Groups Organization         Address  Phone             Notes  [place] [facility] [place] - variety of support groups  [place] Call for more information  Narcotics Anonymous (NA), [facility] [address], [employer] [place]  2 meetings at this location   Special educational needs teacher         Address  Phone  Notes  [facility] [place],    [place]  [phone]   [facility]  [address], [place] [address], [place], [place]   [place], [place] [phone] Admissions: 8am-3pm M-F  Incentives Substance [place] [address]    [place], [place] [phone]   [facility] [address] [place], [place], [place]   [facility] [address]  [place], [place]   [facility] - Intensive  Outpatient [address], [place], [place]   [facility]  [place], [place] [phone] or [phone]   [facility] ([facility]) [address], [place], [place] Accepts Medicaid  [facility] [place] [address],  [place] [phone] Substance Abuse/Addiction Treatment   [facility] Organization         Address  Phone  Notes  [facility]  [phone]   Domenic Schwab, PhD [address] Arlis Porta [place], [place]   [phone] or [phone]   [place], [place] [phone]   [place] [address] [place], [place] [phone] Insurance/Medicaid/sponsorship through [employer] and Families [address], [address]  [place], [place] [phone] [place], [place] [phone]    Dr. Adele Schilder  [phone]   [facility] Dept. 1) [address] [place] 2) [place] 3)  [address], [place] [phone]  [phone]   [place] [phone] or [phone] (After Hours)
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
## 2015-06-09 ENCOUNTER — Other Ambulatory Visit (HOSPITAL_COMMUNITY): Payer: Self-pay | Admitting: Orthopedic Surgery

## 2015-06-09 DIAGNOSIS — T84032A Mechanical loosening of internal right knee prosthetic joint, initial encounter: Secondary | ICD-10-CM

## 2015-06-16 ENCOUNTER — Encounter (HOSPITAL_COMMUNITY): Admission: RE | Admit: 2015-06-16 | Payer: BLUE CROSS/BLUE SHIELD | Source: Ambulatory Visit

## 2015-06-16 ENCOUNTER — Encounter (HOSPITAL_COMMUNITY): Payer: BLUE CROSS/BLUE SHIELD

## 2015-07-13 ENCOUNTER — Encounter (HOSPITAL_COMMUNITY)
Admission: RE | Admit: 2015-07-13 | Discharge: 2015-07-13 | Disposition: A | Discharge status: Home / Self Care | Payer: BLUE CROSS/BLUE SHIELD | Source: Ambulatory Visit | Attending: Orthopedic Surgery | Admitting: Orthopedic Surgery

## 2015-07-13 DIAGNOSIS — T84032A Mechanical loosening of internal right knee prosthetic joint, initial encounter: Secondary | ICD-10-CM | POA: Insufficient documentation

## 2015-07-13 DIAGNOSIS — X58XXXA Exposure to other specified factors, initial encounter: Secondary | ICD-10-CM | POA: Insufficient documentation

## 2015-07-13 MED ORDER — TECHNETIUM TC 99M MEDRONATE IV KIT
25.0000 | PACK | Freq: Once | INTRAVENOUS | Status: AC | PRN
  Administered 2015-07-13: 26.8 via INTRAVENOUS

## 2016-01-18 DIAGNOSIS — H6983 Other specified disorders of Eustachian tube, bilateral: Secondary | ICD-10-CM | POA: Insufficient documentation

## 2016-02-24 DIAGNOSIS — I499 Cardiac arrhythmia, unspecified: Secondary | ICD-10-CM | POA: Insufficient documentation

## 2016-08-24 ENCOUNTER — Other Ambulatory Visit: Payer: Self-pay | Admitting: Orthopedic Surgery

## 2016-08-24 DIAGNOSIS — M75100 Unspecified rotator cuff tear or rupture of unspecified shoulder, not specified as traumatic: Secondary | ICD-10-CM

## 2016-09-13 ENCOUNTER — Ambulatory Visit
Admission: RE | Admit: 2016-09-13 | Discharge: 2016-09-13 | Disposition: A | Discharge status: Home / Self Care | Payer: BLUE CROSS/BLUE SHIELD | Source: Ambulatory Visit | Attending: Orthopedic Surgery | Admitting: Orthopedic Surgery

## 2016-09-13 DIAGNOSIS — M75100 Unspecified rotator cuff tear or rupture of unspecified shoulder, not specified as traumatic: Secondary | ICD-10-CM

## 2016-09-13 MED ORDER — IOPAMIDOL (ISOVUE-M 200) INJECTION 41%
15.0000 mL | Freq: Once | INTRAMUSCULAR | Status: AC
  Administered 2016-09-13: 15 mL via INTRA_ARTICULAR

## 2016-09-17 ENCOUNTER — Other Ambulatory Visit: Payer: BLUE CROSS/BLUE SHIELD

## 2016-12-11 ENCOUNTER — Encounter: Payer: Self-pay | Admitting: Sports Medicine

## 2016-12-11 ENCOUNTER — Ambulatory Visit (INDEPENDENT_AMBULATORY_CARE_PROVIDER_SITE_OTHER): Payer: BLUE CROSS/BLUE SHIELD | Admitting: Sports Medicine

## 2016-12-11 ENCOUNTER — Ambulatory Visit: Payer: Self-pay

## 2016-12-11 VITALS — BP 134/86 | HR 66 | Ht 72.0 in | Wt 190.0 lb

## 2016-12-11 DIAGNOSIS — M175 Other unilateral secondary osteoarthritis of knee: Secondary | ICD-10-CM

## 2016-12-11 MED ORDER — METHYLPREDNISOLONE ACETATE 40 MG/ML IJ SUSP
40.0000 mg | Freq: Once | INTRAMUSCULAR | Status: AC
  Administered 2016-12-11: 40 mg via INTRA_ARTICULAR
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
## 2016-12-11 NOTE — Assessment & Plan Note (Signed)
I think he has very good function in terms of flexion and extension of TKR  However his significant weakness of lateral gastrocnemius, abductors and adductors contribute to unstable rotatory motion at post lateral corner This is causing recurrent popliteus injury as noted with calcifications and thickening  CSI under [place] today  HEP to emphasize strengthening of each of deficits noted  I would like to re-examine motion and strength in 6 to 8 weeks  I believe the rehab if directed correctly might stabilize this  I do not see vascular or neurogenic injury on evaluation and I think the TKR looks very functional

## 2017-01-03 ENCOUNTER — Encounter: Payer: Self-pay | Admitting: Sports Medicine

## 2017-01-03 ENCOUNTER — Ambulatory Visit (INDEPENDENT_AMBULATORY_CARE_PROVIDER_SITE_OTHER): Payer: BLUE CROSS/BLUE SHIELD | Admitting: Sports Medicine

## 2017-01-03 DIAGNOSIS — M175 Other unilateral secondary osteoarthritis of knee: Secondary | ICD-10-CM | POA: Diagnosis not present

## 2017-01-03 MED ORDER — TRAMADOL HCL 50 MG PO TABS: 50.0000 mg | ORAL_TABLET | Freq: Four times a day (QID) | ORAL | 2 refills | Status: DC | PRN

## 2017-01-03 NOTE — Progress Notes (Signed)
RT Knee pain 3 years post TKR  Patient seen 4/10 for popliteus tendon injection CSI under [place] did NOT relieve pain However given exercises Feels that these have helped reduce pain by as much as 50% on most days  Pain will increase to 8/10 if he stands more than 1 hour Knee clicks and feels somewhat unstable only w rotation No numbness or nerve related pain  ROS No sciatica of RT leg No locking or giving way No fever or redness over knee  PE Muscular M in NAD BP (!) 155/96   Ht 6' (1.829 m)   Wt 190 lb (86.2 kg)   BMI 25.77 kg/m   Excellent flexion and full extension TTP post lat corner On rotation he gets a shift at post lat corner No redness or swelling Better hip abduction strength Weak over hamstring on RT

## 2017-01-03 NOTE — Patient Instructions (Signed)
Let's add 3 hamstring exercises:  Extender  Hamstring curls Back step  Add weights when possible  Keep up hip abduction and adduction  Do leg press with 1 leg  Step ups  Modify popliteus however needed  Tramadol 50 mg one morning and mid afternoon

## 2017-01-03 NOTE — Assessment & Plan Note (Signed)
He seems to have good motion of TKR However with persistent pain he still has a lot of weakness of RT leg Add Hamstring strengthening Keep up Hip HEP Modify popliteus exercises as they hurt Co[pression  Trial on tramadol 50 bid  Reck 4 wks

## 2017-01-22 ENCOUNTER — Other Ambulatory Visit: Payer: BLUE CROSS/BLUE SHIELD | Admitting: Sports Medicine

## 2017-01-22 ENCOUNTER — Ambulatory Visit (INDEPENDENT_AMBULATORY_CARE_PROVIDER_SITE_OTHER): Payer: BLUE CROSS/BLUE SHIELD | Admitting: Sports Medicine

## 2017-01-22 ENCOUNTER — Encounter: Payer: Self-pay | Admitting: Sports Medicine

## 2017-01-22 DIAGNOSIS — M175 Other unilateral secondary osteoarthritis of knee: Secondary | ICD-10-CM | POA: Diagnosis not present

## 2017-01-22 NOTE — Assessment & Plan Note (Addendum)
Strength is improving per patient report and on examination. He continues to note persistent pain. Advised that this would be a very slow progression, but the fact that he is continuing to improve is very positive. -Continue compression -Given his recent Norco, will advise patient to take tramadol at least once daily in hopes that he will not need Norco. -Take Aleve as needed for pain (this is not cause GI upset) -Continue with hip, hamstring, and popliteus exercises. -The patient to follow-up in 4-6 weeks

## 2017-01-22 NOTE — Progress Notes (Signed)
Subjective: CC: f/u R knee pain HPI: Patient is a 51 y.o. male with a past medical history of TKR 3 years ago presenting to clinic today for a f/u of that knee pain.  On his last visit, the patient was given popliteus exercises in addition to his other home exercise program. His strength has significantly improved.  He is now doing 110 pounds on single leg presses which is double what he was previously doing. States he can do some of his home exercises without pain. Still frustrated at continued pain. Pain currently only 1/10, however can get up to 7/10. Throbbing in nature.  Typically noticeable a few hours after exercising.  He was given a Rx of Mobic at [facility]- cannot tolerate this due to GI upset. No GI upset with naproxen, ibuprofen, or aleve but doesn't notice improvement Taking tramadol 0-2 times per day depending on pain. Rx from [facility] for [employer]- takes this for breakthrough pain- typically 2-3x/week. Tried flexeril with significant side effects including dizziness.  Uses compression daily and feels it helps.   He had a 3 day period with no pain and pain less frequent now  ROS No sciatica of RT leg No locking or giving way No fever or redness over knee  ROS: All other systems reviewed and are negative.  Social: non-smoker FB coach [employer]  Past Medical History Patient Active Problem List   Diagnosis Date Noted  . DJD (degenerative joint disease) of knee 10/12/2013  . Pars defect of lumbar spine   . Right knee DJD     Medications- reviewed and updated Current Outpatient Prescriptions  Medication Sig Dispense Refill  . acetaminophen (TYLENOL) 325 MG tablet Take 2 tablets (650 mg total) by mouth every 6 (six) hours as needed for mild pain (or Fever >/= 101).    Marland Kitchen aspirin EC 325 MG EC tablet 1 tab a day for the next 30 days to prevent blood clots 30 tablet 0  . cyclobenzaprine (FLEXERIL) 10 MG tablet   0  . diazepam (VALIUM) 2 MG tablet   0    . HYDROcodone-acetaminophen (NORCO/VICODIN) 5-325 MG tablet   0  . ibuprofen (ADVIL,MOTRIN) 800 MG tablet Take 1 tablet (800 mg total) by mouth 3 (three) times daily. 21 tablet 0  . naproxen sodium (ANAPROX) 220 MG tablet Take 220 mg by mouth.    . traMADol (ULTRAM) 50 MG tablet Take 1 tablet (50 mg total) by mouth every 6 (six) hours as needed. 60 tablet 2   No current facility-administered medications for this visit.     Objective: Office vital signs reviewed. BP (!) 141/90   Ht 6' (1.829 m)   Wt 190 lb (86.2 kg)   BMI 25.77 kg/m    Physical Examination:  General: Awake, alert, well- nourished, NAD. Muscular.  Right knee:  Normal to inspection with well healed scar Mildly tender to palpation in the post. lateral corner Excellent flexion and extension Click noted with external rotation Note there is less shift of his knee with dyanamic rotation of post corner 4+/5 hamstring strength.  Neurovascularly intact distally   Assessment/Plan: Right knee DJD Strength is improving per patient report and on examination. He continues to note persistent pain. Scuffs that this would be a very slow progression, but the fact that he is continuing to improve is very positive. -Continue compression -Given his recent Norco, will advise patient to take tramadol at least once daily in hopes that he will not need Norco. -  Take Aleve as needed for pain (this is not cause GI upset) -Continue with hip, hamstring, and popliteus exercises. -The patient to follow-up in 4-6 weeks   No orders of the defined types were placed in this encounter.   Meds ordered this encounter  Medications  . HYDROcodone-acetaminophen (NORCO/VICODIN) 5-325 MG tablet    Refill:  0    Archie Patten PGY-3, [facility]  I observed and examined the patient with the resident and agree with assessment and plan.  Note reviewed and modified by me. Stefanie Libel, MD

## 2017-03-12 ENCOUNTER — Ambulatory Visit: Payer: Self-pay

## 2017-03-12 ENCOUNTER — Encounter: Payer: Self-pay | Admitting: Sports Medicine

## 2017-03-12 ENCOUNTER — Ambulatory Visit (INDEPENDENT_AMBULATORY_CARE_PROVIDER_SITE_OTHER): Payer: BLUE CROSS/BLUE SHIELD | Admitting: Sports Medicine

## 2017-03-12 VITALS — BP 120/80

## 2017-03-12 DIAGNOSIS — M25461 Effusion, right knee: Secondary | ICD-10-CM | POA: Diagnosis not present

## 2017-03-12 DIAGNOSIS — M175 Other unilateral secondary osteoarthritis of knee: Secondary | ICD-10-CM

## 2017-03-12 NOTE — Assessment & Plan Note (Signed)
Status post knee replacement 2015. Continues to have pain in posterior knee. Exam reveals a posterior lateral corner clicking initially thought to possibly be due to the popliteal tendon, however per history it seems that this is probably more from the hamstring tendon.  -Continue compression sleeve -Continue tramadol and ibuprofen, advised against Norco possible -Continue with  strengthening exercises specifically strengthening the hamstring -Follow-up in 6 weeks

## 2017-03-12 NOTE — Patient Instructions (Signed)
Thank you for coming in today, it was so nice to see you! Today we talked about:    Swelling around the hamstring tendons. We recommend due to exercises at home to strengthen the hamstring.  Lunges forward with the foot in an in, neutral, and out position  Leg curls with 5 pounds of weight and swings

## 2017-03-12 NOTE — Assessment & Plan Note (Addendum)
Suprapatellar pouch swelling, worse today and ultrasound was compared to previous. 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 -Continue compression sleeve -Recommended avoiding prolonged standing however it may not be practical for him being a Careers adviser

## 2017-03-12 NOTE — Progress Notes (Signed)
Subjective:    Patient ID: Bruce Cobb , male   DOB: 1966/06/29 , 51 y.o..   MRN: [id number]  HPI  [name] is here for:  1. Follow-up on right knee pain: Patient has significant right knee history. He notes that he tore his anterior cruciate ligament several years ago and had a knee replacement in 2015. Since 2015 he has not had much change in his pain. He had a repeat surgery in January 2017 to try to take out some "granulation tissue ". His pain is intermittent and throbbing, he notes that he can go sometimes at least 5 days without any pain but when the pain comes back it is very bad. He notes that he has been doing his strengthening exercises in addition to working out at the gym 5 out of 7 days a week. The pain is mostly in the back of his knee and radiates around the front.   He is not using any medications consistently but at times takes tramadol, Norco, were ibuprofen. He only takes about 1-2 Norco week and about 1-2 tramadol week when he is in a lot of pain. He has been trying to use tramadol more than Norco but he notes that the tramadol doesn't provide as much relief as the Norco. 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 He wakes up with pain and the pain gradually worsens until nighttime. Admits he is unable to do any hamstring curls. No numbness or tingling.  Review of Systems:  Knee swelling on RT if stands too much No fever No redness around knee    Past Medical History: Patient Active Problem List   Diagnosis Date Noted  . Swelling of joint of right knee 03/12/2017  . DJD (degenerative joint disease) of knee 10/12/2013  . Pars defect of lumbar spine   . Right knee DJD    Medications: reviewed  Social Hx:  reports that he has never smoked. He has never used smokeless tobacco.   Objective:   BP 120/80  Physical Exam  Gen: NAD, alert, cooperative with exam, well-appearing  Right Knee: Normal to inspection with no erythema or  effusion or obvious bony abnormalities. Well-healed vertical incisional scar from knee replacement Palpation showing tenderness in posterior knee with deep palpation. No warmth, joint line tenderness, patellar tenderness, or condyle tenderness. ROM full in flexion and extension and lower leg rotation. There is a click noted with external rotation The posterior lateral corner of knee shows too much motion with rotation and with varus stress 4/5 hamstring strength, otherwise normal strength throughout  Neurovascularly intact  Right knee ultrasound:   Hamstrings intact Improved swelling at the insertion of the lateral gastrocf muscle compared to previous ultrasound Worsening swelling at the suprapatellar pouch compared to previous with a moderate to large effusion Swelling around the popliteus tendon that is mild Biceps femoris tendon appears intact  Impression: moderate to large effusion; S/P knee replacement; soft tissue swelling around posterior lateral corner with no clear structural damage.  Ultrasound and interpretation by Wolfgang Phoenix. Fields, MD    Assessment & Plan:  Swelling of joint of right knee Suprapatellar pouch swelling, worse today and ultrasound was compared to previous. 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 Continues to have  pain in posterior knee. Exam reveals a posterior lateral corner clicking initially thought to possibly be due to the popliteal tendon, however per history it seems that this is probably more from the hamstring tendon.  -Continue compression sleeve -Continue tramadol and ibuprofen, advised against Norco possible -Continue with  strengthening exercises specifically strengthening the hamstring -Follow-up in 6 weeks    Smitty Cords,  MD [facility], PGY-3

## 2017-06-04 ENCOUNTER — Ambulatory Visit (INDEPENDENT_AMBULATORY_CARE_PROVIDER_SITE_OTHER): Payer: BLUE CROSS/BLUE SHIELD | Admitting: Sports Medicine

## 2017-06-04 DIAGNOSIS — M175 Other unilateral secondary osteoarthritis of knee: Secondary | ICD-10-CM

## 2017-06-04 NOTE — Assessment & Plan Note (Signed)
Still gets too much swelling for remote TKR Still unstable at certain motions in [facility]  Discuss revision consult with Dr Noemi Chapel

## 2017-06-04 NOTE — Progress Notes (Signed)
   Subjective:    Patient ID: Bruce Cobb, male    DOB: October 07, 1965, 51 y.o.   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 Unfortunately, he continues to have pain in the posterior lateral portion of knee. He is s/p total knee replacement nearly 4 years ago and had issues with popping, clicking, dull achy pain ever since. At his last visit it was proposed that he may have had some muscular instability (poplietus, biceps femoris, and gastroc). He was given a series of exercises to perform and reports he has been diligent about doing them. He states that the pain is not any better and is negatively affecting his ability to perform at work. He can not stand for long periods of time due to the pain. Sometimes he will take Tramadol or Norco but does not like to take them before practice, as they cloud his mind a bit. He is here today seeking relief of his symptoms  Recent injection to popliteus by DR Noemi Chapel - no relief  Review of Systems Musculoskeletal: No weakness Neurovascular: Denies numbness, paresthesias, pallor Occ. Sharp paon down lateral RT lleg    Objective:   Physical Exam BP 118/82   Ht 6' (1.829 m)   Wt 186 lb (84.4 kg)   BMI 25.23 kg/m   Gen: No acute distress, pleasant Resp: No labored breathing   Right Knee Inspection: well healing vertical surgical incision scar over, mild suprapatellar effusion when compared to the left Palpation: no pain on palpation over suprapatellar, patellar, infrapatellar, joint lines, MCL, LCL, popliteal, pes anserine, tibial plateau ROM: full extension to 0 degrees, 130 degrees of knee flexion Strength: 5/5 strength in knee flexion.extension, hip adduction/abduction, and gluteus  Sensation: intact Vascular: + 2 pulses throughout Special testing: mild posterior rotational shift on knee extension with medial pressure,  Gets popping at PL corner negative Lachman,  bounce, McMurrary's, and valgus/varus stress testing     Assessment & Plan:  Right knee pain/ S/P TKR He has great flexion and extension of the knee but still has some rotation at the posterior lateral corner.Unfortunately, strengthening exercises have not proven effective. We will speak with Dr. Noemi Chapel regarding referral for eval of surgical revision to stabilize the posterior lateral portion of the knee. Pt was advised to continue to perform strengthening exercises and to wear the compression sleeve.

## 2017-08-19 HISTORY — PX: REVISION TOTAL KNEE ARTHROPLASTY: SUR1280

## 2017-09-03 DIAGNOSIS — I480 Paroxysmal atrial fibrillation: Secondary | ICD-10-CM

## 2017-09-03 HISTORY — DX: Paroxysmal atrial fibrillation: I48.0

## 2018-08-11 ENCOUNTER — Telehealth: Payer: Self-pay | Admitting: General Practice

## 2018-08-11 NOTE — Telephone Encounter (Signed)
Patient has been approved by Dr. Larose Kells to become a new pt. I called to set up an appt, however, he was not in a place to be able to schedule yet. Pt will call back when he gets home to schedule.

## 2018-09-08 ENCOUNTER — Telehealth: Payer: Self-pay

## 2018-09-08 NOTE — Telephone Encounter (Signed)
Copied from [place] [id number]. Topic: Appointment Scheduling - New Patient >> Sep 08, 2018  2:32 PM Virl Axe D wrote: New patient has been scheduled for your office. Provider: Larose Kells Date of Appointment: 09/24/18  Route to department's PEC pool.

## 2018-09-24 ENCOUNTER — Encounter: Payer: Self-pay | Admitting: Internal Medicine

## 2018-09-24 ENCOUNTER — Ambulatory Visit (INDEPENDENT_AMBULATORY_CARE_PROVIDER_SITE_OTHER): Payer: BLUE CROSS/BLUE SHIELD | Admitting: Internal Medicine

## 2018-09-24 VITALS — BP 128/84 | HR 82 | Temp 97.3°F | Resp 16 | Ht 72.0 in | Wt 201.0 lb

## 2018-09-24 DIAGNOSIS — M159 Polyosteoarthritis, unspecified: Secondary | ICD-10-CM

## 2018-09-24 DIAGNOSIS — Z8679 Personal history of other diseases of the circulatory system: Secondary | ICD-10-CM

## 2018-09-24 DIAGNOSIS — Z09 Encounter for follow-up examination after completed treatment for conditions other than malignant neoplasm: Secondary | ICD-10-CM | POA: Insufficient documentation

## 2018-09-24 DIAGNOSIS — F419 Anxiety disorder, unspecified: Secondary | ICD-10-CM | POA: Diagnosis not present

## 2018-09-24 DIAGNOSIS — G4700 Insomnia, unspecified: Secondary | ICD-10-CM

## 2018-09-24 DIAGNOSIS — I4891 Unspecified atrial fibrillation: Secondary | ICD-10-CM

## 2018-09-24 DIAGNOSIS — D72819 Decreased white blood cell count, unspecified: Secondary | ICD-10-CM | POA: Insufficient documentation

## 2018-09-24 NOTE — Progress Notes (Signed)
Subjective:    Patient ID: Bruce Cobb, male    DOB: August 09, 1966, 53 y.o.   MRN: [id number]  DOS:  09/24/2018 Type of visit - description: New patient, to get established MSK: Has multiple musculoskeletal problems.  follow-up by orthopedic surgery.  Still trying to remain active. Had a history of atrial fibrillation, single episode a year ago, subsequently was seen by cardiology, no recurrence that he can tell. Occasionally anxiety, some insomnia.  Typically sxs related with his wife's health, she has a history of cancer   Review of Systems Currently doing okay emotionally Denies chest pain, difficulty breathing.  No edema. No palpitations Denies nausea, vomiting, diarrhea  Past Medical History:  Diagnosis Date  . Hematospermia    hx of, remotely   . Paroxysmal atrial fibrillation ([place]) 09/2017   saw cards  . Pars defect of lumbar spine   . PONV (postoperative nausea and vomiting)   . Right knee DJD   . Shingles 2014   face    Past Surgical History:  Procedure Laterality Date  . ANKLE FRACTURE SURGERY Left 1996   plated  . KNEE ARTHROSCOPY Left 7829   plica removed  . KNEE ARTHROSCOPY Right    7 total  . NASAL SEPTUM SURGERY    . ORIF DISTAL RADIUS FRACTURE Right    age 59  . REVISION TOTAL KNEE ARTHROPLASTY Right 08/19/2017  . SHOULDER ARTHROSCOPY W/ ROTATOR CUFF REPAIR Right 2011   bicep tendon and labrium repair  . SHOULDER ARTHROSCOPY WITH ROTATOR CUFF REPAIR Left 2012   w/ labrium repair  . TOTAL KNEE ARTHROPLASTY Right 10/12/2013   Procedure: RIGHT TOTAL KNEE ARTHROPLASTY;  Surgeon: Lorn Junes, MD;  Location: [facility];  Service: Orthopedics;  Laterality: Right;  Marland Kitchen VASECTOMY      Social History   Socioeconomic History  . Marital status: Married    Spouse name: Not on file  . Number of children: 3  . Years of education: Not on file  . Highest education level: Not on file  Occupational History  . Occupation: Financial planner , has a farm  . Occupation:  football Leisure centre manager at a [employer]  . Financial resource strain: Not on file  . Food insecurity:    Worry: Not on file    Inability: Not on file  . Transportation needs:    Medical: Not on file    Non-medical: Not on file  Tobacco Use  . Smoking status: Never Smoker  . Smokeless tobacco: Never Used  Substance and Sexual Activity  . Alcohol use: Yes    Comment: wine daily  . Drug use: No  . Sexual activity: Not on file  Lifestyle  . Physical activity:    Days per week: Not on file    Minutes per session: Not on file  . Stress: Not on file  Relationships  . Social connections:    Talks on phone: Not on file    Gets together: Not on file    Attends religious service: Not on file    Active member of club or organization: Not on file    Attends meetings of clubs or organizations: Not on file    Relationship status: Not on file  . Intimate partner violence:    Fear of current or ex partner: Not on file    Emotionally abused: Not on file    Physically abused: Not on file    Forced sexual activity: Not on file  Other Topics  Concern  . Not on file  Social History Narrative   Married, 3 children.   Wife has a history of breast cancer, recurrent, status post bilateral mastectomy   Wife also had thyroid cancer.   The patient has been very athletic all his life, playing basketball, softball, tennis etc.  As of 09/2018 is not very active due to multiple MSK issues.     Family History  Problem Relation Age of Onset  . Cancer - Prostate Father   . Cancer Father        prostate  . Cancer Mother        lung, bones, liver  . ADD / ADHD Sister   . ADD / ADHD Brother   . Diabetes Neg Hx   . CAD Neg Hx      Allergies as of 09/24/2018      Reactions   Mobic [meloxicam] Other (See Comments)   GI upset      Medication List       Accurate as of September 24, 2018  5:44 PM. Always use your most recent med list.        aspirin 325 MG EC tablet 1 tab a day for the next 30  days to prevent blood clots   cyclobenzaprine 10 MG tablet Commonly known as:  FLEXERIL   ibuprofen 800 MG tablet Commonly known as:  ADVIL,MOTRIN Take 1 tablet (800 mg total) by mouth 3 (three) times daily.   naproxen sodium 220 MG tablet Commonly known as:  ALEVE Take 220 mg by mouth.           Objective:   Physical Exam BP 128/84 (BP Location: Left Arm, Patient Position: Sitting, Cuff Size: Normal)   Pulse 82   Temp (!) 97.3 F (36.3 C) (Oral)   Resp 16   Ht 6' (1.829 m)   Wt 201 lb (91.2 kg)   SpO2 96%   BMI 27.26 kg/m  General:   Well developed, NAD, BMI noted.  HEENT:  Normocephalic . Face symmetric, atraumatic. Neck: No thyromegaly Lungs:  CTA B Normal respiratory effort, no intercostal retractions, no accessory muscle use. Heart: RRR,  no murmur.  no pretibial edema bilaterally  Abdomen:  Not distended, soft, non-tender. No rebound or rigidity.   Skin: Not pale. Not jaundice Neurologic:  alert & oriented X3.  Speech normal, gait appropriate for age and unassisted Psych--  Cognition and judgment appear intact.  Cooperative with normal attention span and concentration.  Behavior appropriate. No anxious or depressed appearing.     Assessment      ASSESSMENT , new pt 09-2018 (refer Dr Para March ) Anxiety, insomnia, episodic CV: Palpitations, DOE: Evaluated at cardi[facility], 2017 Paroxysmal atrial fibrillation: Had an episode around 09-2017, saw Dr. Einar Gip (?),  Was recommended aspirin 325 mg daily.    MSK:  Multiple problems including to right TKR's, bilateral shoulder reconstructions.  Seen at Dr. Laureen Abrahams.   PLAN Anxiety, insomnia: Episodic, recommend observation Paroxysmal A. fib: Reports a single episode 09-2017, in the context [place] drinking plenty of caffeine.  Subsequently was seen by cardiology here in [place], he was simply recommend to take an aspirin 325 mg daily.  Will get records.  Currently  asymptomatic, EKG today normal sinus rhythm. Palpitations, DOE: [facility] and stress test MSK, pain management: His current strategy is to take Tylenol or ibuprofen sporadically.  He has few tramadols  he takes them very rarely.  In previous years he was prescribed gabapentin but he does not like to take anything daily. RTC 3 months fasting CPX

## 2018-09-24 NOTE — Assessment & Plan Note (Addendum)
Anxiety, insomnia: Episodic, recommend observation Paroxysmal A. fib: Reports a single episode 09-2017, in the context of [place], drinking plenty of caffeine.  Subsequently was seen by cardiology here in [place], he was simply recommend to take an aspirin 325 mg daily.  Will get records.  Currently asymptomatic, EKG today normal sinus rhythm. Palpitations, DOE: [facility] a [facility] and stress test MSK, pain management: His current strategy is to take Tylenol or ibuprofen sporadically.  He has few tramadols he takes them very rarely.  In previous years he was prescribed gabapentin but he does not like to take anything daily. RTC 3 months fasting CPX

## 2018-09-24 NOTE — Progress Notes (Signed)
Pre visit review using our clinic review tool, if applicable. No additional management support is needed unless otherwise documented below in the visit note. 

## 2018-09-24 NOTE — Patient Instructions (Signed)
   GO TO THE FRONT DESK Schedule your next appointment    in 3 months, fasting.

## 2018-09-30 ENCOUNTER — Other Ambulatory Visit: Payer: Self-pay | Admitting: Orthopedic Surgery

## 2018-09-30 DIAGNOSIS — M25512 Pain in left shoulder: Secondary | ICD-10-CM

## 2018-10-08 ENCOUNTER — Ambulatory Visit
Admission: RE | Admit: 2018-10-08 | Discharge: 2018-10-08 | Disposition: A | Discharge status: Home / Self Care | Payer: BLUE CROSS/BLUE SHIELD | Source: Ambulatory Visit | Attending: Orthopedic Surgery | Admitting: Orthopedic Surgery

## 2018-10-08 DIAGNOSIS — M25512 Pain in left shoulder: Secondary | ICD-10-CM

## 2018-10-08 MED ORDER — IOPAMIDOL (ISOVUE-M 200) INJECTION 41%
15.0000 mL | Freq: Once | INTRAMUSCULAR | Status: AC
  Administered 2018-10-08: 15 mL via INTRA_ARTICULAR

## 2018-12-26 ENCOUNTER — Encounter: Payer: BLUE CROSS/BLUE SHIELD | Admitting: Internal Medicine

## 2019-03-28 ENCOUNTER — Encounter: Payer: Self-pay | Admitting: Internal Medicine

## 2019-06-10 ENCOUNTER — Encounter: Payer: Self-pay | Admitting: Internal Medicine

## 2019-06-29 DIAGNOSIS — C4491 Basal cell carcinoma of skin, unspecified: Secondary | ICD-10-CM | POA: Insufficient documentation

## 2019-12-30 ENCOUNTER — Other Ambulatory Visit: Payer: Self-pay

## 2019-12-30 ENCOUNTER — Ambulatory Visit (INDEPENDENT_AMBULATORY_CARE_PROVIDER_SITE_OTHER): Payer: BC Managed Care – PPO | Admitting: Podiatry

## 2019-12-30 ENCOUNTER — Encounter: Payer: Self-pay | Admitting: Podiatry

## 2019-12-30 ENCOUNTER — Ambulatory Visit (INDEPENDENT_AMBULATORY_CARE_PROVIDER_SITE_OTHER): Payer: BC Managed Care – PPO

## 2019-12-30 VITALS — BP 130/85 | HR 106 | Temp 97.7°F | Resp 16

## 2019-12-30 DIAGNOSIS — M79671 Pain in right foot: Secondary | ICD-10-CM

## 2019-12-30 DIAGNOSIS — M778 Other enthesopathies, not elsewhere classified: Secondary | ICD-10-CM | POA: Diagnosis not present

## 2019-12-30 DIAGNOSIS — M779 Enthesopathy, unspecified: Secondary | ICD-10-CM

## 2019-12-30 MED ORDER — TRAMADOL HCL 50 MG PO TABS: 50.0000 mg | ORAL_TABLET | Freq: Three times a day (TID) | ORAL | 2 refills | Status: DC

## 2019-12-30 NOTE — Progress Notes (Signed)
   Subjective:    Patient ID: Bruce Cobb, male    DOB: 11/30/1965, 54 y.o.   MRN: [id number]  HPI    Review of Systems  All other systems reviewed and are negative.      Objective:   Physical Exam        Assessment & Plan:

## 2019-12-30 NOTE — Progress Notes (Signed)
Subjective:   Patient ID: Bruce Cobb, male   DOB: 54 y.o.   MRN: [id number]   HPI Patient presents stating I have a swollen big toe joint right and I was climbing a brick wall in flip-flops and I pulled my toe and its been sore ever since it was about 3 to 4 weeks ago.  Patient does not smoke likes to be active   Review of Systems  All other systems reviewed and are negative.       Objective:  Physical Exam Vitals and nursing note reviewed.  Constitutional:      Appearance: He is well-developed.  Pulmonary:     Effort: Pulmonary effort is normal.  Musculoskeletal:        General: Normal range of motion.  Skin:    General: Skin is warm.  Neurological:     Mental Status: He is alert.     Neurovascular status intact muscle strength found to be adequate range of motion within normal limits.  Patient is noted to have inflammation pain of the first MPJ right with fluid buildup that is painful when pressed and makes wearing shoe gear difficult.  Patient has tried shoe gear modifications     Assessment:  Inflammatory capsulitis with possible low-grade hallux limitus deformity right first MPJ     Plan:  H&P I went ahead did sterile prep and injected the joint 3 mg Dexasone Kenalog 5 mg Xylocaine I reviewed the pain he is experiencing and placed him on tramadol to try to help with his pain in his big toe joint and advised on rigid bottom shoes.  Reappoint as needed  X-rays indicate that there is no indications of spur formation mild elongation first metatarsal segment

## 2020-01-01 ENCOUNTER — Other Ambulatory Visit: Payer: Self-pay | Admitting: Podiatry

## 2020-01-01 DIAGNOSIS — M779 Enthesopathy, unspecified: Secondary | ICD-10-CM

## 2020-01-12 ENCOUNTER — Other Ambulatory Visit: Payer: Self-pay | Admitting: Orthopedic Surgery

## 2020-01-12 DIAGNOSIS — M25511 Pain in right shoulder: Secondary | ICD-10-CM

## 2020-01-12 DIAGNOSIS — M25512 Pain in left shoulder: Secondary | ICD-10-CM

## 2020-02-15 ENCOUNTER — Ambulatory Visit
Admission: RE | Admit: 2020-02-15 | Discharge: 2020-02-15 | Disposition: A | Discharge status: Home / Self Care | Payer: BLUE CROSS/BLUE SHIELD | Source: Ambulatory Visit | Attending: Orthopedic Surgery | Admitting: Orthopedic Surgery

## 2020-02-15 ENCOUNTER — Ambulatory Visit
Admission: RE | Admit: 2020-02-15 | Discharge: 2020-02-15 | Disposition: A | Discharge status: Home / Self Care | Payer: BC Managed Care – PPO | Source: Ambulatory Visit | Attending: Orthopedic Surgery | Admitting: Orthopedic Surgery

## 2020-02-15 ENCOUNTER — Other Ambulatory Visit: Payer: Self-pay

## 2020-02-15 DIAGNOSIS — M25511 Pain in right shoulder: Secondary | ICD-10-CM

## 2020-02-15 DIAGNOSIS — M25512 Pain in left shoulder: Secondary | ICD-10-CM

## 2020-06-14 ENCOUNTER — Other Ambulatory Visit: Payer: Self-pay | Admitting: Orthopaedic Surgery

## 2020-06-14 DIAGNOSIS — M25512 Pain in left shoulder: Secondary | ICD-10-CM

## 2020-06-23 ENCOUNTER — Other Ambulatory Visit: Payer: BC Managed Care – PPO

## 2020-07-08 ENCOUNTER — Other Ambulatory Visit: Payer: Self-pay

## 2020-07-08 ENCOUNTER — Ambulatory Visit
Admission: RE | Admit: 2020-07-08 | Discharge: 2020-07-08 | Disposition: A | Discharge status: Home / Self Care | Payer: BC Managed Care – PPO | Source: Ambulatory Visit | Attending: Orthopaedic Surgery | Admitting: Orthopaedic Surgery

## 2020-07-08 DIAGNOSIS — M25512 Pain in left shoulder: Secondary | ICD-10-CM

## 2020-07-21 ENCOUNTER — Other Ambulatory Visit: Payer: BC Managed Care – PPO

## 2020-11-14 ENCOUNTER — Encounter: Payer: Self-pay | Admitting: Internal Medicine

## 2021-09-12 ENCOUNTER — Other Ambulatory Visit: Payer: Self-pay | Admitting: Orthopaedic Surgery

## 2021-09-12 DIAGNOSIS — S43432A Superior glenoid labrum lesion of left shoulder, initial encounter: Secondary | ICD-10-CM

## 2021-10-02 ENCOUNTER — Other Ambulatory Visit: Payer: BC Managed Care – PPO

## 2021-10-03 NOTE — Progress Notes (Signed)
Sent message, via epic in basket, requesting orders in epic from surgeon.  

## 2021-10-04 NOTE — Patient Instructions (Signed)
DUE TO COVID-19 ONLY ONE VISITOR IS ALLOWED TO COME WITH YOU AND STAY IN THE WAITING ROOM ONLY DURING PRE OP AND PROCEDURE.   **NO VISITORS ARE ALLOWED IN THE SHORT STAY AREA OR RECOVERY ROOM!!**  You are not required to quarantine, however you are required to wear a well-fitted mask when you are out and around people not in your household.  Hand Hygiene often Do NOT share personal items Notify your provider if you are in close contact with someone who has COVID or you develop fever 100.4 or greater, new onset of sneezing, cough, sore throat, shortness of breath or body aches.       Your procedure is scheduled on: Wednesday, 10-11-21   Report to [facility] Main  Entrance     Report to admitting at 12:15 PM   Call this number if you have problems the morning of surgery [phone]   Do not eat food :After Midnight.   May have liquids until 12:00 PM  day of surgery  CLEAR LIQUID DIET  Foods Allowed                                                                     Foods Excluded  Water, Black Coffee (no milk/no creamer) and tea, regular and decaf                              liquids that you cannot  Plain Jell-O in any flavor  (No red)                         see through such as: Fruit ices (not with fruit pulp)                                 milk, soups, orange juice  Iced Popsicles (No red)                                    All solid food                             Apple juices Sports drinks like Gatorade (No red) Lightly seasoned clear broth or consume(fat free) Sugar    Complete one Ensure drink the morning of surgery at  12:00 PM the day of surgery.     The day of surgery:  Drink ONE (1) Pre-Surgery Clear Ensure the morning of surgery. Drink in one sitting. Do not sip.  This drink was given to you during your hospital  pre-op appointment visit. Nothing else to drink after completing the Pre-Surgery Clear Ensure          If you have questions, please  contact your surgeons office.     Oral Hygiene is also important to reduce your risk of infection.  Remember - BRUSH YOUR TEETH THE MORNING OF SURGERY WITH YOUR REGULAR TOOTHPASTE   Do NOT smoke after Midnight   Take these medicines the morning of surgery with A SIP OF WATER:  Tramadol if needed   Stop all vitamins and herbal supplements a week before surgery             You may not have any metal on your body including  jewelry, and body piercing             Do not wear  lotions, powders, cologne, or deodorant              Men may shave face and neck.  Do not bring valuables to the hospital. [place].   Contacts, dentures or bridgework may not be worn into surgery.   Patients discharged the day of surgery will not be allowed to drive home.   A responsible adult must remain with you for 24 hours after surgery.  Special Instructions: Bring a copy of your healthcare power of attorney and living will documents the day of surgery if you haven't scanned them in before.  Please read over the following fact sheets you were given: IF YOU HAVE QUESTIONS ABOUT YOUR PRE OP INSTRUCTIONS PLEASE CALL [place]- Preparing for Total Shoulder Arthroplasty    Before surgery, you can play an important role. Because skin is not sterile, your skin needs to be as free of germs as possible. You can reduce the number of germs on your skin by using the following products. Benzoyl Peroxide Gel Reduces the number of germs present on the skin Applied twice a day to shoulder area starting two days before surgery    ==================================================================  Please follow these instructions carefully:  BENZOYL PEROXIDE 5% GEL  Please do not use if you have an allergy to benzoyl peroxide.   If your skin becomes reddened/irritated stop using the benzoyl peroxide.  Starting two days before  surgery, apply as follows: Apply benzoyl peroxide in the morning and at night. Apply after taking a shower. If you are not taking a shower clean entire shoulder front, back, and side along with the armpit with a clean wet washcloth.  Place a quarter-sized dollop on your shoulder and rub in thoroughly, making sure to cover the front, back, and side of your shoulder, along with the armpit.   2 days before ____ AM   ____ PM              1 day before ____ AM   ____ PM                         Do this twice a day for two days.  (Last application is the night before surgery, AFTER using the CHG soap as described below).  Do NOT apply benzoyl peroxide gel on the day of surgery.   [place] - Preparing for Surgery Before surgery, you can play an important role.  Because skin is not sterile, your skin needs to be as free of germs as possible.  You can reduce the number of germs on your skin by washing with CHG (chlorahexidine gluconate) soap before surgery.  CHG is an antiseptic cleaner which kills germs and bonds with the skin to continue killing germs even after washing. Please DO NOT use if you have an allergy to CHG or antibacterial soaps.  If your skin becomes reddened/irritated  stop using the CHG and inform your nurse when you arrive at Short Stay. Do not shave (including legs and underarms) for at least 48 hours prior to the first CHG shower.  You may shave your face/neck.  Please follow these instructions carefully:  1.  Shower with CHG Soap the night before surgery and the  morning of surgery.  2.  If you choose to wash your hair, wash your hair first as usual with your normal  shampoo.  3.  After you shampoo, rinse your hair and body thoroughly to remove the shampoo.                             4.  Use CHG as you would any other liquid soap.  You can apply chg directly to the skin and wash.  Gently with a scrungie or clean washcloth.  5.  Apply the CHG Soap to your body ONLY FROM THE NECK DOWN.    Do   not use on face/ open                           Wound or open sores. Avoid contact with eyes, ears mouth and   genitals (private parts).                       Wash face,  Genitals (private parts) with your normal soap.             6.  Wash thoroughly, paying special attention to the area where your    surgery  will be performed.  7.  Thoroughly rinse your body with warm water from the neck down.  8.  DO NOT shower/wash with your normal soap after using and rinsing off the CHG Soap.                9.  Pat yourself dry with a clean towel.            10.  Wear clean pajamas.            11.  Place clean sheets on your bed the night of your first shower and do not  sleep with pets. Day of Surgery : Do not apply any lotions/deodorants the morning of surgery.  Please wear clean clothes to the hospital/surgery center.  FAILURE TO FOLLOW THESE INSTRUCTIONS MAY RESULT IN THE CANCELLATION OF YOUR SURGERY  PATIENT SIGNATURE_________________________________  NURSE SIGNATURE__________________________________  ________________________________________________________________________   Bruce Cobb  An incentive spirometer is a tool that can help keep your lungs clear and active. This tool measures how well you are filling your lungs with each breath. Taking long deep breaths may help reverse or decrease the chance of developing breathing (pulmonary) problems (especially infection) following: A long period of time when you are unable to move or be active. BEFORE THE PROCEDURE  If the spirometer includes an indicator to show your best effort, your nurse or respiratory therapist will set it to a desired goal. If possible, sit up straight or lean slightly forward. Try not to slouch. Hold the incentive spirometer in an upright position. INSTRUCTIONS FOR USE  Sit on the edge of your bed if possible, or sit up as far as you can in bed or on a chair. Hold the incentive spirometer in an upright  position. Breathe out normally. Place the mouthpiece in your mouth and seal your lips tightly  around it. Breathe in slowly and as deeply as possible, raising the piston or the ball toward the top of the column. Hold your breath for 3-5 seconds or for as long as possible. Allow the piston or ball to fall to the bottom of the column. Remove the mouthpiece from your mouth and breathe out normally. Rest for a few seconds and repeat Steps 1 through 7 at least 10 times every 1-2 hours when you are awake. Take your time and take a few normal breaths between deep breaths. The spirometer may include an indicator to show your best effort. Use the indicator as a goal to work toward during each repetition. After each set of 10 deep breaths, practice coughing to be sure your lungs are clear. If you have an incision (the cut made at the time of surgery), support your incision when coughing by placing a pillow or rolled up towels firmly against it. Once you are able to get out of bed, walk around indoors and cough well. You may stop using the incentive spirometer when instructed by your caregiver.  RISKS AND COMPLICATIONS Take your time so you do not get dizzy or light-headed. If you are in pain, you may need to take or ask for pain medication before doing incentive spirometry. It is harder to take a deep breath if you are having pain. AFTER USE Rest and breathe slowly and easily. It can be helpful to keep track of a log of your progress. Your caregiver can provide you with a simple table to help with this. If you are using the spirometer at home, follow these instructions: [place] IF:  You are having difficultly using the spirometer. You have trouble using the spirometer as often as instructed. Your pain medication is not giving enough relief while using the spirometer. You develop fever of 100.5 F (38.1 C) or higher. SEEK IMMEDIATE MEDICAL CARE IF:  You cough up bloody sputum that had not been  present before. You develop fever of 102 F (38.9 C) or greater. You develop worsening pain at or near the incision site. MAKE SURE YOU:  Understand these instructions. Will watch your condition. Will get help right away if you are not doing well or get worse. Document Released: 12/31/2006 Document Revised: 11/12/2011 Document Reviewed: 03/03/2007 ExitCare Patient Information 2014 ExitCare, [place].   ________________________________________________________________________  WHAT IS A BLOOD TRANSFUSION? Blood Transfusion Information  A transfusion is the replacement of blood or some of its parts. Blood is made up of multiple cells which provide different functions. Red blood cells carry oxygen and are used for blood loss replacement. White blood cells fight against infection. Platelets control bleeding. Plasma helps clot blood. Other blood products are available for specialized needs, such as hemophilia or other clotting disorders. BEFORE THE TRANSFUSION  Who gives blood for transfusions?  Healthy volunteers who are fully evaluated to make sure their blood is safe. This is blood bank blood. Transfusion therapy is the safest it has ever been in the practice of medicine. Before blood is taken from a donor, a complete history is taken to make sure that person has no history of diseases nor engages in risky social behavior (examples are intravenous drug use or sexual activity with multiple partners). The donor's travel history is screened to minimize risk of transmitting infections, such as malaria. The donated blood is tested for signs of infectious diseases, such as HIV and hepatitis. The blood is then tested to be sure it is compatible with you  in order to minimize the chance of a transfusion reaction. If you or a relative donates blood, this is often done in anticipation of surgery and is not appropriate for emergency situations. It takes many days to process the donated blood. RISKS AND  COMPLICATIONS Although transfusion therapy is very safe and saves many lives, the main dangers of transfusion include:  Getting an infectious disease. Developing a transfusion reaction. This is an allergic reaction to something in the blood you were given. Every precaution is taken to prevent this. The decision to have a blood transfusion has been considered carefully by your caregiver before blood is given. Blood is not given unless the benefits outweigh the risks. AFTER THE TRANSFUSION Right after receiving a blood transfusion, you will usually feel much better and more energetic. This is especially true if your red blood cells have gotten low (anemic). The transfusion raises the level of the red blood cells which carry oxygen, and this usually causes an energy increase. The nurse administering the transfusion will monitor you carefully for complications. HOME CARE INSTRUCTIONS  No special instructions are needed after a transfusion. You may find your energy is better. Speak with your caregiver about any limitations on activity for underlying diseases you may have. SEEK MEDICAL CARE IF:  Your condition is not improving after your transfusion. You develop redness or irritation at the intravenous (IV) site. SEEK IMMEDIATE MEDICAL CARE IF:  Any of the following symptoms occur over the next 12 hours: Shaking chills. You have a temperature by mouth above 102 F (38.9 C), not controlled by medicine. Chest, back, or muscle pain. People around you feel you are not acting correctly or are confused. Shortness of breath or difficulty breathing. Dizziness and fainting. You get a rash or develop hives. You have a decrease in urine output. Your urine turns a dark color or changes to pink, red, or brown. Any of the following symptoms occur over the next 10 days: You have a temperature by mouth above 102 F (38.9 C), not controlled by medicine. Shortness of breath. Weakness after normal activity. The  white part of the eye turns yellow (jaundice). You have a decrease in the amount of urine or are urinating less often. Your urine turns a dark color or changes to pink, red, or brown. Document Released: 08/17/2000 Document Revised: 11/12/2011 Document Reviewed: 04/05/2008 [facility] Patient Information 2014 [place], [place].  _______________________________________________________________________

## 2021-10-04 NOTE — Progress Notes (Signed)
COVID swab appointment: N/A  COVID Vaccine Completed: Date COVID Vaccine completed: Has received booster: COVID vaccine manufacturer: [place]   Date of COVID positive in last 90 days:  PCP - Kathlene November, MD Cardiologist - Ricky Ala, MD (last OV 2017)  Chest x-ray -  EKG -  Stress Test - greater than 2 years CEW ECHO - greater than 2 years CEW Cardiac Cath -  Pacemaker/ICD device last checked: Spinal Cord Stimulator:  Sleep Study -  CPAP -   Fasting Blood Sugar -  Checks Blood Sugar _____ times a day  Blood Thinner Instructions: Aspirin Instructions: Last Dose:  Activity level:  Can go up a flight of stairs and perform activities of daily living without stopping and without symptoms of chest pain or shortness of breath.   Able to exercise without symptoms  Unable to go up a flight of stairs without symptoms of      Anesthesia review:  Afib, palpitations and dyspnea on exertion  Patient denies shortness of breath, fever, cough and chest pain at PAT appointment   Patient verbalized understanding of instructions that were given to them at the PAT appointment. Patient was also instructed that they will need to review over the PAT instructions again at home before surgery.

## 2021-10-05 ENCOUNTER — Encounter (HOSPITAL_COMMUNITY)
Admission: RE | Admit: 2021-10-05 | Discharge: 2021-10-05 | Disposition: A | Discharge status: Home / Self Care | Payer: BC Managed Care – PPO | Source: Ambulatory Visit | Attending: Internal Medicine | Admitting: Internal Medicine

## 2021-10-05 DIAGNOSIS — Z01818 Encounter for other preprocedural examination: Secondary | ICD-10-CM

## 2021-10-09 ENCOUNTER — Other Ambulatory Visit (HOSPITAL_COMMUNITY): Payer: BC Managed Care – PPO

## 2021-10-09 NOTE — H&P (Signed)
PREOPERATIVE H&P  Chief Complaint: left shoulder failed hardware  HPI: Bruce Cobb is a 56 y.o. male who is scheduled for Procedure(s): REVISION TOTAL SHOULDER TO REVERSE TOTAL SHOULDER.   Patient has a past medical history significant for PONV and paroxysmal atrial fibrillation.   He is a 56 year old whom we have been following for his left shoulder. He had a total shoulder arthroplasty done in December 2021. He was overall been doing quite well. In May 2022 he had his arm pulled into maximum abduction external rotation and had pain in his shoulder. We ended up doing a steroid trial which helped some initially. However, he has continued to have pain and catching in his shoulder.   His symptoms are rated as moderate to severe, and have been worsening.  This is significantly impairing activities of daily living.    Please see clinic note for further details on this patient's care.    He has elected for surgical management.   Past Medical History:  Diagnosis Date   BCC (basal cell carcinoma of skin)    Hematospermia    hx of, remotely    Paroxysmal atrial fibrillation ([place]) 09/2017   saw cards   Pars defect of lumbar spine    PONV (postoperative nausea and vomiting)    Right knee DJD    Shingles 2014   face   Past Surgical History:  Procedure Laterality Date   ANKLE FRACTURE SURGERY Left 1996   plated   KNEE ARTHROSCOPY Left 5400   plica removed   KNEE ARTHROSCOPY Right    7 total   NASAL SEPTUM SURGERY     ORIF DISTAL RADIUS FRACTURE Right    age 12   REVISION TOTAL KNEE ARTHROPLASTY Right 08/19/2017   SHOULDER ARTHROSCOPY W/ ROTATOR CUFF REPAIR Right 2011   bicep tendon and labrium repair   SHOULDER ARTHROSCOPY WITH ROTATOR CUFF REPAIR Left 2012   w/ labrium repair   TOTAL KNEE ARTHROPLASTY Right 10/12/2013   Procedure: RIGHT TOTAL KNEE ARTHROPLASTY;  Surgeon: Lorn Junes, MD;  Location: [facility];  Service: Orthopedics;  Laterality: Right;   VASECTOMY      Social History   Socioeconomic History   Marital status: Married    Spouse name: Not on file   Number of children: 3   Years of education: Not on file   Highest education level: Not on file  Occupational History   Occupation: Financial planner , has a farm   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   Wife has a history of breast cancer, recurrent, status post bilateral mastectomy   Wife also had thyroid cancer.   The patient has been very athletic all his life, playing basketball, softball, tennis etc.  As of 09/2018 is not very active due to multiple MSK issues.   Social Determinants of Health   Financial Resource Strain: Not on file  Food Insecurity: Not on file  Transportation Needs: Not on file  Physical Activity: Not on file  Stress: Not on file  Social Connections: Not on file   Family History  Problem Relation Age of Onset   Cancer -  Prostate Father    Cancer Father        prostate   Cancer Mother        lung, bones, liver   ADD / ADHD Sister    ADD / ADHD Brother    Diabetes Neg Hx    CAD Neg Hx    Allergies  Allergen Reactions   Mobic [Meloxicam] Other (See Comments)    GI upset   Prior to Admission medications   Medication Sig Start Date End Date Taking? Authorizing Provider  ibuprofen (ADVIL) 200 MG tablet Take 400 mg by mouth every 6 (six) hours as needed for mild pain.   Yes [provider]  sildenafil (REVATIO) 20 MG tablet Take 20 mg by mouth daily as needed (ED). 12/12/19  Yes [provider]  traMADol (ULTRAM) 50 MG tablet Take 1 tablet (50 mg total) by mouth 3 (three) times daily. Patient taking differently: Take 100 mg by mouth 2 (two) times daily. 12/30/19  Yes  Regal, Tamala Fothergill, DPM  celecoxib (CELEBREX) 100 MG capsule Take 100 mg by mouth. Patient not taking: Reported on 10/02/2021 05/19/21   [provider]    ROS: All other systems have been reviewed and were otherwise negative with the exception of those mentioned in the HPI and as above.  Physical Exam: General: Alert, no acute distress Cardiovascular: No pedal edema Respiratory: No cyanosis, no use of accessory musculature GI: No organomegaly, abdomen is soft and non-tender Skin: No lesions in the area of chief complaint Neurologic: Sensation intact distally Psychiatric: Patient is competent for consent with normal mood and affect Lymphatic: No axillary or cervical lymphadenopathy  MUSCULOSKELETAL:  The range of motion of the left shoulder is limited. He has pain with motion over 150 degrees. He has a sensation of his shoulder giving away. During the examination he has an obvious asymmetry with his belly press as well as his bear hug,  pain with bear hug.   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  There is some possible loosening of the glenoid component likely considering his instability of the joint.    Assessment: left shoulder failed hardware  Plan: Plan for Procedure(s): REVISION TOTAL SHOULDER TO REVERSE TOTAL SHOULDER  The risks benefits and alternatives were discussed with the patient including but not limited to the risks of nonoperative treatment, versus surgical intervention including infection, bleeding, nerve injury,  blood clots, cardiopulmonary complications, morbidity, mortality, among others, and they were willing to proceed.   We additionally specifically discussed risks of axillary nerve injury, infection, periprosthetic fracture, continued pain and longevity of implants prior to beginning procedure.    Patient will be closely monitored in PACU for medical stabilization and pain  control. If found stable in PACU, patient may be discharged home with outpatient follow-up. If any concerns regarding patient's stabilization patient will be admitted for observation after surgery. The patient is planning to be discharged home with outpatient PT.   The patient acknowledged the explanation, agreed to proceed with the plan and consent was signed.   Operative Plan: Left shoulder revision to reverse total shoulder arthroplasty Discharge Medications: Standard DVT Prophylaxis: Aspirin Physical Therapy: Outpatient PT Special Discharge needs: [place]. [place], PA-C  10/09/2021 4:13 PM

## 2021-10-10 ENCOUNTER — Other Ambulatory Visit: Payer: Self-pay

## 2021-10-10 ENCOUNTER — Encounter (HOSPITAL_COMMUNITY)
Admission: RE | Admit: 2021-10-10 | Discharge: 2021-10-10 | Disposition: A | Discharge status: Home / Self Care | Payer: BC Managed Care – PPO | Source: Ambulatory Visit | Attending: Orthopaedic Surgery | Admitting: Orthopaedic Surgery

## 2021-10-10 ENCOUNTER — Encounter (HOSPITAL_COMMUNITY): Payer: Self-pay

## 2021-10-10 VITALS — BP 157/97 | HR 78 | Temp 98.7°F | Resp 18 | Ht 72.0 in | Wt 174.2 lb

## 2021-10-10 DIAGNOSIS — Z01818 Encounter for other preprocedural examination: Secondary | ICD-10-CM | POA: Insufficient documentation

## 2021-10-10 DIAGNOSIS — I2510 Atherosclerotic heart disease of native coronary artery without angina pectoris: Secondary | ICD-10-CM | POA: Insufficient documentation

## 2021-10-10 DIAGNOSIS — Z96612 Presence of left artificial shoulder joint: Secondary | ICD-10-CM | POA: Diagnosis not present

## 2021-10-10 DIAGNOSIS — I480 Paroxysmal atrial fibrillation: Secondary | ICD-10-CM | POA: Diagnosis not present

## 2021-10-10 DIAGNOSIS — Z9889 Other specified postprocedural states: Secondary | ICD-10-CM | POA: Diagnosis not present

## 2021-10-10 DIAGNOSIS — Y793 Surgical instruments, materials and orthopedic devices (including sutures) associated with adverse incidents: Secondary | ICD-10-CM | POA: Diagnosis not present

## 2021-10-10 DIAGNOSIS — M75102 Unspecified rotator cuff tear or rupture of left shoulder, not specified as traumatic: Secondary | ICD-10-CM | POA: Diagnosis not present

## 2021-10-10 DIAGNOSIS — T84498A Other mechanical complication of other internal orthopedic devices, implants and grafts, initial encounter: Secondary | ICD-10-CM | POA: Diagnosis not present

## 2021-10-10 LAB — CBC
HCT: 40.8 % (ref 39.0–52.0)
Hemoglobin: 14.2 g/dL (ref 13.0–17.0)
MCH: 32.6 pg (ref 26.0–34.0)
MCHC: 34.8 g/dL (ref 30.0–36.0)
MCV: 93.8 fL (ref 80.0–100.0)
Platelets: 231 10*3/uL (ref 150–400)
RBC: 4.35 MIL/uL (ref 4.22–5.81)
RDW: 11.6 % (ref 11.5–15.5)
WBC: 3.1 10*3/uL — ABNORMAL LOW (ref 4.0–10.5)
nRBC: 0 % (ref 0.0–0.2)

## 2021-10-10 LAB — BASIC METABOLIC PANEL
Anion gap: 10 (ref 5–15)
BUN: 12 mg/dL (ref 6–20)
CO2: 27 mmol/L (ref 22–32)
Calcium: 8.9 mg/dL (ref 8.9–10.3)
Chloride: 101 mmol/L (ref 98–111)
Creatinine, Ser: 0.86 mg/dL (ref 0.61–1.24)
GFR, Estimated: >60 mL/min (ref >60)
Glucose, Bld: 83 mg/dL (ref 70–99)
Potassium: 4.2 mmol/L (ref 3.5–5.1)
Sodium: 138 mmol/L (ref 135–145)

## 2021-10-10 LAB — SURGICAL PCR SCREEN
MRSA, PCR: NEGATIVE
Staphylococcus aureus: NEGATIVE

## 2021-10-10 NOTE — Progress Notes (Addendum)
COVID swab appointment: N/A   COVID Vaccine Completed:  No Date COVID Vaccine completed: Has received booster: COVID vaccine manufacturer: [place]    Date of COVID positive in last 90 days:  No   PCP - Hubbard Robinson at [facility] - Ricky Ala, MD (last OV 2017)   Chest x-ray - N/A EKG - 10-10-21 Epic Stress Test - greater than 2 years CEW ECHO - greater than 2 years CEW Cardiac Cath - N/A Pacemaker/ICD device last checked: Spinal Cord Stimulator:   Sleep Study - N/A CPAP -    Fasting Blood Sugar - N/A Checks Blood Sugar _____ times a day   Blood Thinner Instructions:  N/A Aspirin Instructions: Last Dose:   Activity level:   Can go up a flight of stairs and perform activities of daily living without stopping and without symptoms of chest pain or shortness of breath.  Able to exercise without symptoms, patient is very active.     Anesthesia review:  Afib, one episode while hiking at a high altitude.     BP elevated at PAT, no diagnosis of HTN   Patient denies shortness of breath, fever, cough and chest pain at PAT appointment    Patient verbalized understanding of instructions that were given to them at the PAT appointment. Patient was also instructed that they will need to review over the PAT instructions again at home before surgery.

## 2021-10-10 NOTE — Patient Instructions (Signed)
DUE TO COVID-19 ONLY ONE VISITOR IS ALLOWED TO COME WITH YOU AND STAY IN THE WAITING ROOM ONLY DURING PRE OP AND PROCEDURE.   **NO VISITORS ARE ALLOWED IN THE SHORT STAY AREA OR RECOVERY ROOM!!**   You are not required to quarantine, however you are required to wear a well-fitted mask when you are out and around people not in your household.  Hand Hygiene often Do NOT share personal items Notify your provider if you are in close contact with someone who has COVID or you develop fever 100.4 or greater, new onset of sneezing, cough, sore throat, shortness of breath or body aches.   Your procedure is scheduled on: Wednesday, 10-11-21   Report to [facility] Main  Entrance     Report to admitting at 9:00 AM   Call this number if you have problems the morning of surgery [phone]   Do not eat food :After Midnight.   May have liquids until 8:45 AM day of surgery  CLEAR LIQUID DIET  Foods Allowed                                                                     Foods Excluded  Water, Black Coffee (no milk/no creamer) and tea, regular and decaf                              liquids that you cannot  Plain Jell-O in any flavor  (No red)                         see through such as: Fruit ices (not with fruit pulp)                                 milk, soups, orange juice  Iced Popsicles (No red)                                    All solid food                             Apple juices Sports drinks like Gatorade (No red) Lightly seasoned clear broth or consume(fat free) Sugar     Complete one Ensure drink the morning of surgery at 8:45 AM the day of surgery.       The day of surgery:  Drink ONE (1) Pre-Surgery Clear Ensure the morning of surgery. Drink in one sitting. Do not sip.  This drink was given to you during your hospital  pre-op appointment visit. Nothing else to drink after completing the Pre-Surgery Clear Ensure          If you have questions, please contact  your surgeons office.     Oral Hygiene is also important to reduce your risk of infection.                                    Remember -  BRUSH YOUR TEETH THE MORNING OF SURGERY WITH YOUR REGULAR TOOTHPASTE   Do NOT smoke after Midnight   Take these medicines the morning of surgery with A SIP OF WATER: Tramadol if needed   Stop all vitamins and herbal supplements a week before surgery.     Stop Motrin, Aleve, Ibuprofen a week before surgery.             You may not have any metal on your body including jewelry, and body piercing             Do not wear  lotions, powders, cologne, or deodorant              Men may shave face and neck.   Contacts, dentures or bridgework may not be worn into surgery.    Patients discharged the day of surgery will not be allowed to drive home.   A responsible adult must remain with you for 24 hours after surgery.  Please read over the following fact sheets you were given: IF YOU HAVE QUESTIONS ABOUT YOUR PRE OP INSTRUCTIONS PLEASE CALL [facility]- Preparing for Total Shoulder Arthroplasty    Before surgery, you can play an important role. Because skin is not sterile, your skin needs to be as free of germs as possible. You can reduce the number of germs on your skin by using the following products. Benzoyl Peroxide Gel Reduces the number of germs present on the skin Applied twice a day to shoulder area starting two days before surgery    ==================================================================  Please follow these instructions carefully:  BENZOYL PEROXIDE 5% GEL  Please do not use if you have an allergy to benzoyl peroxide.   If your skin becomes reddened/irritated stop using the benzoyl peroxide.  Starting two days before surgery, apply as follows: Apply benzoyl peroxide in the morning and at night. Apply after taking a shower. If you are not taking a shower clean entire shoulder front, back, and side along with  the armpit with a clean wet washcloth.  Place a quarter-sized dollop on your shoulder and rub in thoroughly, making sure to cover the front, back, and side of your shoulder, along with the armpit.   2 days before ____ AM   ____ PM              1 day before ____ AM   ____ PM                         Do this twice a day for two days.  (Last application is the night before surgery, AFTER using the CHG soap as described below).  Do NOT apply benzoyl peroxide gel on the day of surgery.    [facility] - Preparing for Surgery Before surgery, you can play an important role.  Because skin is not sterile, your skin needs to be as free of germs as possible.  You can reduce the number of germs on your skin by washing with CHG (chlorahexidine gluconate) soap before surgery.  CHG is an antiseptic cleaner which kills germs and bonds with the skin to continue killing germs even after washing. Please DO NOT use if you have an allergy to CHG or antibacterial soaps.  If your skin becomes reddened/irritated stop using the CHG and inform your nurse when you arrive at Short Stay. Do not shave (including legs and underarms) for at least 48 hours prior to the first CHG  shower.  You may shave your face/neck.  Please follow these instructions carefully:  1.  Shower with CHG Soap the night before surgery and the  morning of surgery.  2.  If you choose to wash your hair, wash your hair first as usual with your normal  shampoo.  3.  After you shampoo, rinse your hair and body thoroughly to remove the shampoo.                             4.  Use CHG as you would any other liquid soap.  You can apply chg directly to the skin and wash.  Gently with a scrungie or clean washcloth.  5.  Apply the CHG Soap to your body ONLY FROM THE NECK DOWN.   Do   not use on face/ open                           Wound or open sores. Avoid contact with eyes, ears mouth and   genitals (private parts).                       Wash face,  Genitals  (private parts) with your normal soap.             6.  Wash thoroughly, paying special attention to the area where your    surgery  will be performed.  7.  Thoroughly rinse your body with warm water from the neck down.  8.  DO NOT shower/wash with your normal soap after using and rinsing off the CHG Soap.                9.  Pat yourself dry with a clean towel.            10.  Wear clean pajamas.            11.  Place clean sheets on your bed the night of your first shower and do not  sleep with pets. Day of Surgery : Do not apply any lotions/deodorants the morning of surgery.  Please wear clean clothes to the hospital/surgery center.  FAILURE TO FOLLOW THESE INSTRUCTIONS MAY RESULT IN THE CANCELLATION OF YOUR SURGERY  PATIENT SIGNATURE_________________________________  NURSE SIGNATURE__________________________________  ________________________________________________________________________   Bruce Cobb  An incentive spirometer is a tool that can help keep your lungs clear and active. This tool measures how well you are filling your lungs with each breath. Taking long deep breaths may help reverse or decrease the chance of developing breathing (pulmonary) problems (especially infection) following: A long period of time when you are unable to move or be active. BEFORE THE PROCEDURE  If the spirometer includes an indicator to show your best effort, your nurse or respiratory therapist will set it to a desired goal. If possible, sit up straight or lean slightly forward. Try not to slouch. Hold the incentive spirometer in an upright position. INSTRUCTIONS FOR USE  Sit on the edge of your bed if possible, or sit up as far as you can in bed or on a chair. Hold the incentive spirometer in an upright position. Breathe out normally. Place the mouthpiece in your mouth and seal your lips tightly around it. Breathe in slowly and as deeply as possible, raising the piston or the ball toward  the top of the column. Hold your breath for 3-5 seconds or for as  long as possible. Allow the piston or ball to fall to the bottom of the column. Remove the mouthpiece from your mouth and breathe out normally. Rest for a few seconds and repeat Steps 1 through 7 at least 10 times every 1-2 hours when you are awake. Take your time and take a few normal breaths between deep breaths. The spirometer may include an indicator to show your best effort. Use the indicator as a goal to work toward during each repetition. After each set of 10 deep breaths, practice coughing to be sure your lungs are clear. If you have an incision (the cut made at the time of surgery), support your incision when coughing by placing a pillow or rolled up towels firmly against it. Once you are able to get out of bed, walk around indoors and cough well. You may stop using the incentive spirometer when instructed by your caregiver.  RISKS AND COMPLICATIONS Take your time so you do not get dizzy or light-headed. If you are in pain, you may need to take or ask for pain medication before doing incentive spirometry. It is harder to take a deep breath if you are having pain. AFTER USE Rest and breathe slowly and easily. It can be helpful to keep track of a log of your progress. Your caregiver can provide you with a simple table to help with this. If you are using the spirometer at home, follow these instructions: [place] IF:  You are having difficultly using the spirometer. You have trouble using the spirometer as often as instructed. Your pain medication is not giving enough relief while using the spirometer. You develop fever of 100.5 F (38.1 C) or higher. SEEK IMMEDIATE MEDICAL CARE IF:  You cough up bloody sputum that had not been present before. You develop fever of 102 F (38.9 C) or greater. You develop worsening pain at or near the incision site. MAKE SURE YOU:  Understand these instructions. Will watch your  condition. Will get help right away if you are not doing well or get worse. Document Released: 12/31/2006 Document Revised: 11/12/2011 Document Reviewed: 03/03/2007 Exit[employer] Patient Information 2014 [employer], [place].   ________________________________________________________________________  WHAT IS A BLOOD TRANSFUSION? Blood Transfusion Information  A transfusion is the replacement of blood or some of its parts. Blood is made up of multiple cells which provide different functions. Red blood cells carry oxygen and are used for blood loss replacement. White blood cells fight against infection. Platelets control bleeding. Plasma helps clot blood. Other blood products are available for specialized needs, such as hemophilia or other clotting disorders. BEFORE THE TRANSFUSION  Who gives blood for transfusions?  Healthy volunteers who are fully evaluated to make sure their blood is safe. This is blood bank blood. Transfusion therapy is the safest it has ever been in the practice of medicine. Before blood is taken from a donor, a complete history is taken to make sure that person has no history of diseases nor engages in risky social behavior (examples are intravenous drug use or sexual activity with multiple partners). The donor's travel history is screened to minimize risk of transmitting infections, such as malaria. The donated blood is tested for signs of infectious diseases, such as HIV and hepatitis. The blood is then tested to be sure it is compatible with you in order to minimize the chance of a transfusion reaction. If you or a relative donates blood, this is often done in anticipation of surgery and is not appropriate for emergency  situations. It takes many days to process the donated blood. RISKS AND COMPLICATIONS Although transfusion therapy is very safe and saves many lives, the main dangers of transfusion include:  Getting an infectious disease. Developing a transfusion reaction. This is  an allergic reaction to something in the blood you were given. Every precaution is taken to prevent this. The decision to have a blood transfusion has been considered carefully by your caregiver before blood is given. Blood is not given unless the benefits outweigh the risks. AFTER THE TRANSFUSION Right after receiving a blood transfusion, you will usually feel much better and more energetic. This is especially true if your red blood cells have gotten low (anemic). The transfusion raises the level of the red blood cells which carry oxygen, and this usually causes an energy increase. The nurse administering the transfusion will monitor you carefully for complications. HOME CARE INSTRUCTIONS  No special instructions are needed after a transfusion. You may find your energy is better. Speak with your caregiver about any limitations on activity for underlying diseases you may have. SEEK MEDICAL CARE IF:  Your condition is not improving after your transfusion. You develop redness or irritation at the intravenous (IV) site. SEEK IMMEDIATE MEDICAL CARE IF:  Any of the following symptoms occur over the next 12 hours: Shaking chills. You have a temperature by mouth above 102 F (38.9 C), not controlled by medicine. Chest, back, or muscle pain. People around you feel you are not acting correctly or are confused. Shortness of breath or difficulty breathing. Dizziness and fainting. You get a rash or develop hives. You have a decrease in urine output. Your urine turns a dark color or changes to pink, red, or brown. Any of the following symptoms occur over the next 10 days: You have a temperature by mouth above 102 F (38.9 C), not controlled by medicine. Shortness of breath. Weakness after normal activity. The white part of the eye turns yellow (jaundice). You have a decrease in the amount of urine or are urinating less often. Your urine turns a dark color or changes to pink, red, or brown. Document  Released: 08/17/2000 Document Revised: 11/12/2011 Document Reviewed: 04/05/2008 [facility] Patient Information 2014 [place], [place].  _______________________________________________________________________

## 2021-10-11 ENCOUNTER — Ambulatory Visit (HOSPITAL_COMMUNITY)
Admission: RE | Admit: 2021-10-11 | Discharge: 2021-10-11 | Disposition: A | Discharge status: Home / Self Care | Payer: BC Managed Care – PPO | Source: Ambulatory Visit | Attending: Orthopaedic Surgery | Admitting: Orthopaedic Surgery

## 2021-10-11 ENCOUNTER — Encounter (HOSPITAL_COMMUNITY): Payer: Self-pay | Admitting: Orthopaedic Surgery

## 2021-10-11 ENCOUNTER — Ambulatory Visit (HOSPITAL_COMMUNITY): Payer: BC Managed Care – PPO | Admitting: Certified Registered Nurse Anesthetist

## 2021-10-11 ENCOUNTER — Ambulatory Visit (HOSPITAL_COMMUNITY): Payer: BC Managed Care – PPO

## 2021-10-11 ENCOUNTER — Encounter (HOSPITAL_COMMUNITY)
Admission: RE | Disposition: A | Discharge status: Home / Self Care | Payer: Self-pay | Source: Ambulatory Visit | Attending: Orthopaedic Surgery

## 2021-10-11 DIAGNOSIS — I480 Paroxysmal atrial fibrillation: Secondary | ICD-10-CM | POA: Insufficient documentation

## 2021-10-11 DIAGNOSIS — Y793 Surgical instruments, materials and orthopedic devices (including sutures) associated with adverse incidents: Secondary | ICD-10-CM | POA: Insufficient documentation

## 2021-10-11 DIAGNOSIS — Z96612 Presence of left artificial shoulder joint: Secondary | ICD-10-CM | POA: Insufficient documentation

## 2021-10-11 DIAGNOSIS — Z09 Encounter for follow-up examination after completed treatment for conditions other than malignant neoplasm: Secondary | ICD-10-CM

## 2021-10-11 DIAGNOSIS — M75102 Unspecified rotator cuff tear or rupture of left shoulder, not specified as traumatic: Secondary | ICD-10-CM | POA: Insufficient documentation

## 2021-10-11 DIAGNOSIS — T84498A Other mechanical complication of other internal orthopedic devices, implants and grafts, initial encounter: Secondary | ICD-10-CM | POA: Insufficient documentation

## 2021-10-11 DIAGNOSIS — Z9889 Other specified postprocedural states: Secondary | ICD-10-CM | POA: Insufficient documentation

## 2021-10-11 HISTORY — PX: REVISION TOTAL SHOULDER TO REVERSE TOTAL SHOULDER: SHX6313

## 2021-10-11 LAB — TYPE AND SCREEN
ABO/RH(D): O POS
Antibody Screen: NEGATIVE

## 2021-10-11 SURGERY — REVISION, REVERSE TOTAL ARTHROPLASTY, SHOULDER
Anesthesia: General | Site: Shoulder | Laterality: Left

## 2021-10-11 MED ORDER — ONDANSETRON HCL 4 MG PO TABS: 4.0000 mg | ORAL_TABLET | Freq: Three times a day (TID) | ORAL | 0 refills | Status: AC | PRN

## 2021-10-11 MED ORDER — 0.9 % SODIUM CHLORIDE (POUR BTL) OPTIME
TOPICAL | Status: DC | PRN
  Administered 2021-10-11: 1000 mL

## 2021-10-11 MED ORDER — FENTANYL CITRATE (PF) 100 MCG/2ML IJ SOLN
INTRAMUSCULAR | Status: DC | PRN
  Administered 2021-10-11 (×2): 50 ug via INTRAVENOUS

## 2021-10-11 MED ORDER — OXYCODONE HCL 5 MG PO TABS: ORAL_TABLET | ORAL | 0 refills | Status: AC

## 2021-10-11 MED ORDER — TRIAMCINOLONE ACETONIDE 40 MG/ML IJ SUSP
INTRAMUSCULAR | Status: DC | PRN
Start: 2021-10-11 — End: 2021-10-11
  Administered 2021-10-11: 80 mg via INTRA_ARTICULAR

## 2021-10-11 MED ORDER — BUPIVACAINE-EPINEPHRINE (PF) 0.25% -1:200000 IJ SOLN
INTRAMUSCULAR | Status: AC
  Filled 2021-10-11: qty 30

## 2021-10-11 MED ORDER — LACTATED RINGERS IV BOLUS
250.0000 mL | Freq: Once | INTRAVENOUS | Status: AC
  Administered 2021-10-11: 250 mL via INTRAVENOUS

## 2021-10-11 MED ORDER — DOXYCYCLINE HYCLATE 100 MG PO CAPS: 100.0000 mg | ORAL_CAPSULE | Freq: Two times a day (BID) | ORAL | 0 refills | Status: AC

## 2021-10-11 MED ORDER — ASPIRIN 81 MG PO CHEW: 81.0000 mg | CHEWABLE_TABLET | Freq: Two times a day (BID) | ORAL | 0 refills | Status: AC

## 2021-10-11 MED ORDER — FENTANYL CITRATE PF 50 MCG/ML IJ SOSY
PREFILLED_SYRINGE | INTRAMUSCULAR | Status: AC
  Filled 2021-10-11: qty 2

## 2021-10-11 MED ORDER — EPHEDRINE SULFATE-NACL 50-0.9 MG/10ML-% IV SOSY
PREFILLED_SYRINGE | INTRAVENOUS | Status: DC | PRN
  Administered 2021-10-11: 10 mg via INTRAVENOUS
  Administered 2021-10-11 (×2): 5 mg via INTRAVENOUS

## 2021-10-11 MED ORDER — LACTATED RINGERS IV BOLUS
500.0000 mL | Freq: Once | INTRAVENOUS | Status: AC
  Administered 2021-10-11: 500 mL via INTRAVENOUS

## 2021-10-11 MED ORDER — GABAPENTIN 100 MG PO CAPS: 100.0000 mg | ORAL_CAPSULE | Freq: Three times a day (TID) | ORAL | 0 refills | Status: DC

## 2021-10-11 MED ORDER — BUPIVACAINE LIPOSOME 1.3 % IJ SUSP
INTRAMUSCULAR | Status: DC | PRN
  Administered 2021-10-11: 10 mL via PERINEURAL

## 2021-10-11 MED ORDER — DEXAMETHASONE SODIUM PHOSPHATE 10 MG/ML IJ SOLN
INTRAMUSCULAR | Status: DC | PRN
  Administered 2021-10-11: 5 mg via INTRAVENOUS

## 2021-10-11 MED ORDER — PROMETHAZINE HCL 25 MG/ML IJ SOLN: 6.2500 mg | INTRAMUSCULAR | Status: DC | PRN

## 2021-10-11 MED ORDER — ROCURONIUM BROMIDE 10 MG/ML (PF) SYRINGE
PREFILLED_SYRINGE | INTRAVENOUS | Status: DC | PRN
  Administered 2021-10-11: 20 mg via INTRAVENOUS
  Administered 2021-10-11: 60 mg via INTRAVENOUS
  Administered 2021-10-11: 20 mg via INTRAVENOUS

## 2021-10-11 MED ORDER — FENTANYL CITRATE PF 50 MCG/ML IJ SOSY
25.0000 ug | PREFILLED_SYRINGE | INTRAMUSCULAR | Status: DC | PRN
  Administered 2021-10-11 (×2): 50 ug via INTRAVENOUS

## 2021-10-11 MED ORDER — ROCURONIUM BROMIDE 10 MG/ML (PF) SYRINGE
PREFILLED_SYRINGE | INTRAVENOUS | Status: AC
  Filled 2021-10-11: qty 10

## 2021-10-11 MED ORDER — STERILE WATER FOR IRRIGATION IR SOLN
Status: DC | PRN
  Administered 2021-10-11: 2000 mL

## 2021-10-11 MED ORDER — VANCOMYCIN HCL 1000 MG IV SOLR
INTRAVENOUS | Status: AC
  Filled 2021-10-11: qty 20

## 2021-10-11 MED ORDER — LACTATED RINGERS IV SOLN: INTRAVENOUS | Status: DC

## 2021-10-11 MED ORDER — VANCOMYCIN HCL 1 G IV SOLR
INTRAVENOUS | Status: DC | PRN
  Administered 2021-10-11: 1000 mg

## 2021-10-11 MED ORDER — PHENYLEPHRINE HCL-NACL 20-0.9 MG/250ML-% IV SOLN
INTRAVENOUS | Status: DC | PRN
  Administered 2021-10-11: 50 ug/min via INTRAVENOUS

## 2021-10-11 MED ORDER — PROPOFOL 10 MG/ML IV BOLUS
INTRAVENOUS | Status: DC | PRN
Start: 2021-10-11 — End: 2021-10-11
  Administered 2021-10-11: 200 mg via INTRAVENOUS

## 2021-10-11 MED ORDER — OXYCODONE HCL 5 MG PO TABS
5.0000 mg | ORAL_TABLET | Freq: Once | ORAL | Status: AC | PRN
Start: 2021-10-11 — End: 2021-10-11
  Administered 2021-10-11: 5 mg via ORAL

## 2021-10-11 MED ORDER — DEXAMETHASONE SODIUM PHOSPHATE 10 MG/ML IJ SOLN
INTRAMUSCULAR | Status: AC
  Filled 2021-10-11: qty 1

## 2021-10-11 MED ORDER — ACETAMINOPHEN 500 MG PO TABS
1000.0000 mg | ORAL_TABLET | Freq: Once | ORAL | Status: AC
  Administered 2021-10-11: 1000 mg via ORAL

## 2021-10-11 MED ORDER — ONDANSETRON HCL 4 MG/2ML IJ SOLN
INTRAMUSCULAR | Status: AC
  Filled 2021-10-11: qty 2

## 2021-10-11 MED ORDER — ACETAMINOPHEN 500 MG PO TABS
1000.0000 mg | ORAL_TABLET | Freq: Once | ORAL | Status: DC
  Filled 2021-10-11: qty 2

## 2021-10-11 MED ORDER — TRIAMCINOLONE ACETONIDE 40 MG/ML IJ SUSP
INTRAMUSCULAR | Status: AC
  Filled 2021-10-11: qty 2

## 2021-10-11 MED ORDER — CEFAZOLIN SODIUM-DEXTROSE 2-4 GM/100ML-% IV SOLN
2.0000 g | INTRAVENOUS | Status: AC
  Administered 2021-10-11: 2 g via INTRAVENOUS
  Filled 2021-10-11: qty 100

## 2021-10-11 MED ORDER — FENTANYL CITRATE PF 50 MCG/ML IJ SOSY
50.0000 ug | PREFILLED_SYRINGE | Freq: Once | INTRAMUSCULAR | Status: AC
  Administered 2021-10-11: 50 ug via INTRAVENOUS
  Filled 2021-10-11: qty 2

## 2021-10-11 MED ORDER — TOBRAMYCIN SULFATE 1.2 G IJ SOLR
INTRAMUSCULAR | Status: AC
  Filled 2021-10-11: qty 1.2

## 2021-10-11 MED ORDER — OXYCODONE HCL 5 MG PO TABS
ORAL_TABLET | ORAL | Status: AC
  Filled 2021-10-11: qty 1

## 2021-10-11 MED ORDER — PROPOFOL 10 MG/ML IV BOLUS
INTRAVENOUS | Status: AC
  Filled 2021-10-11: qty 20

## 2021-10-11 MED ORDER — LIDOCAINE 2% (20 MG/ML) 5 ML SYRINGE
INTRAMUSCULAR | Status: DC | PRN
  Administered 2021-10-11: 40 mg via INTRAVENOUS

## 2021-10-11 MED ORDER — ACETAMINOPHEN 500 MG PO TABS: 1000.0000 mg | ORAL_TABLET | Freq: Three times a day (TID) | ORAL | 0 refills | Status: AC

## 2021-10-11 MED ORDER — BUPIVACAINE HCL (PF) 0.5 % IJ SOLN
INTRAMUSCULAR | Status: DC | PRN
  Administered 2021-10-11: 15 mL via PERINEURAL

## 2021-10-11 MED ORDER — CHLORHEXIDINE GLUCONATE 0.12 % MT SOLN
15.0000 mL | Freq: Once | OROMUCOSAL | Status: AC
  Administered 2021-10-11: 15 mL via OROMUCOSAL

## 2021-10-11 MED ORDER — PHENYLEPHRINE 40 MCG/ML (10ML) SYRINGE FOR IV PUSH (FOR BLOOD PRESSURE SUPPORT)
PREFILLED_SYRINGE | INTRAVENOUS | Status: DC | PRN
  Administered 2021-10-11: 120 ug via INTRAVENOUS

## 2021-10-11 MED ORDER — TOBRAMYCIN SULFATE 1.2 G IJ SOLR
INTRAMUSCULAR | Status: DC | PRN
  Administered 2021-10-11: 1.2 g

## 2021-10-11 MED ORDER — OXYCODONE HCL 5 MG/5ML PO SOLN: 5.0000 mg | Freq: Once | ORAL | Status: AC | PRN

## 2021-10-11 MED ORDER — MIDAZOLAM HCL 2 MG/2ML IJ SOLN
INTRAMUSCULAR | Status: AC
  Filled 2021-10-11: qty 2

## 2021-10-11 MED ORDER — SUGAMMADEX SODIUM 200 MG/2ML IV SOLN
INTRAVENOUS | Status: DC | PRN
  Administered 2021-10-11: 200 mg via INTRAVENOUS

## 2021-10-11 MED ORDER — BUPIVACAINE-EPINEPHRINE (PF) 0.25% -1:200000 IJ SOLN
INTRAMUSCULAR | Status: DC | PRN
Start: 2021-10-11 — End: 2021-10-11
  Administered 2021-10-11: 2 mL

## 2021-10-11 MED ORDER — MIDAZOLAM HCL 2 MG/2ML IJ SOLN
1.0000 mg | Freq: Once | INTRAMUSCULAR | Status: AC
  Administered 2021-10-11: 2 mg via INTRAVENOUS
  Filled 2021-10-11: qty 2

## 2021-10-11 MED ORDER — TRANEXAMIC ACID-NACL 1000-0.7 MG/100ML-% IV SOLN
1000.0000 mg | INTRAVENOUS | Status: AC
  Administered 2021-10-11: 1000 mg via INTRAVENOUS
  Filled 2021-10-11: qty 100

## 2021-10-11 MED ORDER — FENTANYL CITRATE (PF) 100 MCG/2ML IJ SOLN
INTRAMUSCULAR | Status: AC
  Filled 2021-10-11: qty 2

## 2021-10-11 MED ORDER — ORAL CARE MOUTH RINSE: 15.0000 mL | Freq: Once | OROMUCOSAL | Status: AC

## 2021-10-11 MED ORDER — ONDANSETRON HCL 4 MG/2ML IJ SOLN
INTRAMUSCULAR | Status: DC | PRN
Start: 2021-10-11 — End: 2021-10-11
  Administered 2021-10-11: 4 mg via INTRAVENOUS

## 2021-10-11 MED ORDER — SODIUM CHLORIDE 0.9 % IR SOLN
Status: DC | PRN
  Administered 2021-10-11: 3000 mL

## 2021-10-11 MED ORDER — CELECOXIB 100 MG PO CAPS: 100.0000 mg | ORAL_CAPSULE | Freq: Two times a day (BID) | ORAL | 0 refills | Status: AC

## 2021-10-11 MED ORDER — MIDAZOLAM HCL 5 MG/5ML IJ SOLN
INTRAMUSCULAR | Status: DC | PRN
  Administered 2021-10-11: 2 mg via INTRAVENOUS

## 2021-10-11 SURGICAL SUPPLY — 77 items
AID PSTN UNV HD RSTRNT DISP (MISCELLANEOUS) ×1
APL PRP STRL LF DISP 70% ISPRP (MISCELLANEOUS) ×2
BAG COUNTER SPONGE SURGICOUNT (BAG) ×3 IMPLANT
BAG SPNG CNTER NS LX DISP (BAG) ×1
BASEPLATE GLENOID STD REV 42 (Joint) ×1 IMPLANT
BASEPLATE GLENOSPHERE 25 STD (Miscellaneous) ×1 IMPLANT
BIT DRILL 2.0X128 (BIT) ×1 IMPLANT
BIT DRILL 3.2 PERIPHERAL SCREW (BIT) ×1 IMPLANT
BLADE SAW SAG 73X25 THK (BLADE) ×1
BLADE SAW SGTL 73X25 THK (BLADE) ×2 IMPLANT
BLADE SAW SGTL 81X20 HD (BLADE) ×1 IMPLANT
BSPLAT GLND STD 25 RVRS SHLDR (Miscellaneous) ×1 IMPLANT
CHLORAPREP W/TINT 26 (MISCELLANEOUS) ×6 IMPLANT
CLSR STERI-STRIP ANTIMIC 1/2X4 (GAUZE/BANDAGES/DRESSINGS) ×3 IMPLANT
COOLER ICEMAN CLASSIC (MISCELLANEOUS) IMPLANT
COVER BACK TABLE 60X90IN (DRAPES) IMPLANT
COVER SURGICAL LIGHT HANDLE (MISCELLANEOUS) ×3 IMPLANT
DRAPE C-ARM 42X120 X-RAY (DRAPES) IMPLANT
DRAPE INCISE IOBAN 66X45 STRL (DRAPES) ×3 IMPLANT
DRAPE ORTHO SPLIT 77X108 STRL (DRAPES) ×4
DRAPE SHEET LG 3/4 BI-LAMINATE (DRAPES) ×6 IMPLANT
DRAPE SURG ORHT 6 SPLT 77X108 (DRAPES) ×4 IMPLANT
DRSG AQUACEL AG ADV 3.5X 6 (GAUZE/BANDAGES/DRESSINGS) ×3 IMPLANT
ELECT BLADE TIP CTD 4 INCH (ELECTRODE) ×3 IMPLANT
ELECT PENCIL ROCKER SW 15FT (MISCELLANEOUS) IMPLANT
ELECT REM PT RETURN 15FT ADLT (MISCELLANEOUS) ×3 IMPLANT
FACESHIELD WRAPAROUND (MASK) ×2 IMPLANT
FACESHIELD WRAPAROUND OR TEAM (MASK) ×2 IMPLANT
GLOVE SRG 8 PF TXTR STRL LF DI (GLOVE) ×2 IMPLANT
GLOVE SURG ENC MOIS LTX SZ6.5 (GLOVE) ×6 IMPLANT
GLOVE SURG NEOPR MICRO LF SZ8 (GLOVE) ×6 IMPLANT
GLOVE SURG UNDER POLY LF SZ6.5 (GLOVE) ×3 IMPLANT
GLOVE SURG UNDER POLY LF SZ8 (GLOVE) ×2
GOWN STRL REUS W/TWL LRG LVL3 (GOWN DISPOSABLE) ×3 IMPLANT
GOWN STRL REUS W/TWL XL LVL3 (GOWN DISPOSABLE) ×3 IMPLANT
GUIDE PIN 3X75 SHOULDER (PIN) ×2
GUIDEWIRE GLENOID 2.5X220 (WIRE) ×1 IMPLANT
HANDPIECE INTERPULSE COAX TIP (DISPOSABLE) ×2
IMPL REVERSE SHOULDER 0X3.5 (Shoulder) IMPLANT
IMPLANT REVERSE SHOULDER 0X3.5 (Shoulder) ×2 IMPLANT
INSERT SHLD REV 42X6 ANGLE B (Insert) ×1 IMPLANT
KIT BASIN OR (CUSTOM PROCEDURE TRAY) ×3 IMPLANT
KIT STABILIZATION SHOULDER (MISCELLANEOUS) ×3 IMPLANT
KIT TURNOVER KIT A (KITS) IMPLANT
MANIFOLD NEPTUNE II (INSTRUMENTS) ×3 IMPLANT
NDL MAYO CATGUT SZ4 TPR NDL (NEEDLE) IMPLANT
NEEDLE MAYO CATGUT SZ4 (NEEDLE) IMPLANT
NS IRRIG 1000ML POUR BTL (IV SOLUTION) ×3 IMPLANT
PACK SHOULDER (CUSTOM PROCEDURE TRAY) ×3 IMPLANT
PAD COLD SHLDR WRAP-ON (PAD) IMPLANT
PIN GUIDE 3X75 SHOULDER (PIN) IMPLANT
RESTRAINT HEAD UNIVERSAL NS (MISCELLANEOUS) ×3 IMPLANT
SCREW 5.0X18 (Screw) ×1 IMPLANT
SCREW 5.5X22 (Screw) ×1 IMPLANT
SCREW 5.5X26 (Screw) ×1 IMPLANT
SCREW BONE 6.5 OD 30 NON BIO (Screw) ×1 IMPLANT
SCREW PERIPHERAL 30 (Screw) ×1 IMPLANT
SET HNDPC FAN SPRY TIP SCT (DISPOSABLE) ×2 IMPLANT
SLING ULTRA II L (ORTHOPEDIC SUPPLIES) IMPLANT
SLING ULTRA III MED (ORTHOPEDIC SUPPLIES) ×3 IMPLANT
SPONGE T-LAP 18X18 ~~LOC~~+RFID (SPONGE) ×3 IMPLANT
SPONGE T-LAP 4X18 ~~LOC~~+RFID (SPONGE) ×3 IMPLANT
STEM HUMERAL 3B LONG 98 (Stem) IMPLANT
STEM HUMERAL SZ 3B LONG 98MM (Stem) ×2 IMPLANT
SUCTION FRAZIER HANDLE 12FR (TUBING) ×2
SUCTION TUBE FRAZIER 12FR DISP (TUBING) ×2 IMPLANT
SUT ETHIBOND 2 V 37 (SUTURE) ×3 IMPLANT
SUT ETHIBOND NAB CT1 #1 30IN (SUTURE) ×3 IMPLANT
SUT FIBERWIRE #5 38 CONV NDL (SUTURE)
SUT MNCRL AB 4-0 PS2 18 (SUTURE) ×3 IMPLANT
SUT VIC AB 0 CT1 36 (SUTURE) IMPLANT
SUT VIC AB 3-0 SH 27 (SUTURE) ×2
SUT VIC AB 3-0 SH 27X BRD (SUTURE) ×2 IMPLANT
SUTURE FIBERWR #5 38 CONV NDL (SUTURE) IMPLANT
TOWEL OR 17X26 10 PK STRL BLUE (TOWEL DISPOSABLE) ×3 IMPLANT
TUBE SUCTION HIGH CAP CLEAR NV (SUCTIONS) ×3 IMPLANT
WATER STERILE IRR 1000ML POUR (IV SOLUTION) ×6 IMPLANT

## 2021-10-11 NOTE — Anesthesia Procedure Notes (Signed)
Procedure Name: Intubation Date/Time: 10/11/2021 12:22 PM Performed by: West Pugh, CRNA Pre-anesthesia Checklist: Patient identified, Emergency Drugs available, Suction available, Patient being monitored and Timeout performed Patient Re-evaluated:Patient Re-evaluated prior to induction Oxygen Delivery Method: Circle system utilized Preoxygenation: Pre-oxygenation with 100% oxygen Induction Type: IV induction Ventilation: Mask ventilation without difficulty Laryngoscope Size: Mac and 4 Grade View: Grade III Tube type: Oral Tube size: 7.5 mm Number of attempts: 2 Airway Equipment and Method: Stylet Placement Confirmation: ETT inserted through vocal cords under direct vision, positive ETCO2, CO2 detector and breath sounds checked- equal and bilateral Secured at: 21 cm Tube secured with: Tape Dental Injury: Teeth and Oropharynx as per pre-operative assessment  Comments: Small abrasion on upper left side of lip, sterile lubricant applied

## 2021-10-11 NOTE — Anesthesia Postprocedure Evaluation (Signed)
Anesthesia Post Note  Patient: Bruce Cobb  Procedure(s) Performed: LEFT REVISION TOTAL SHOULDER TO REVERSE TOTAL SHOULDER, RIGHT SHOULDER STERIOD INJECTION (Left: Shoulder)     Patient location during evaluation: PACU Anesthesia Type: General Level of consciousness: awake and alert Pain management: pain level controlled Vital Signs Assessment: post-procedure vital signs reviewed and stable Respiratory status: spontaneous breathing, nonlabored ventilation and respiratory function stable Cardiovascular status: stable and blood pressure returned to baseline Anesthetic complications: no   No notable events documented.  Last Vitals:  Vitals:   10/11/21 1530 10/11/21 1534  BP: (!) 145/95 (!) 163/84  Pulse: 79   Resp: 11 12  Temp: 36.7 C   SpO2: 99% 100%    Last Pain:  Vitals:   10/11/21 1534  TempSrc:   PainSc: [place]

## 2021-10-11 NOTE — Op Note (Addendum)
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  Patient's anterior subscapularis had torn.  His implants were extremely well fixed and there is no sign of infection.  That said we did take samples however we have a low suspicion for infection overall.  This will have to be taken into account if one of the cultures does come back positive.  Failure mode of this case was cuff failure and has no sign of loosening or visual signs of infection.  Post-operative plan: The patient will be NWB in sling.  The patient will be will be discharged from PACU if continues to be stable as was plan prior to surgery.we will maintain him in doxycycline person 1 stage revision protocols.  DVT prophylaxis Aspirin 81 mg twice daily for 6 weeks.  Pain control with PRN pain medication preferring oral medicines.  Follow up plan will be scheduled in approximately 7 days for incision check and XR.  Physical therapy to start after 4 weeks.  Implants: Tornier flex size 3B long stem with a 0 high offset tray and 42+6 poly, 25 standard baseplate with a 42 glenosphere, 30 center screw and 4 peripheral screws  Post-Op Diagnosis: Same Surgeons:Primary: Hiram Gash, MD Assistants:Caroline McBane PA-C Location: [facility] ROOM 10 Anesthesia: General with Exparel Interscalene Antibiotics: Ancef 2g preop, Vancomycin 1000mg  locally Tourniquet time: None Estimated Blood Loss: 197 Complications: None Specimens: 3 for culture, hold for 3 weeks to rule out C acnes Implants: Implant Name Type Inv. Item Serial No. Manufacturer Lot No. LRB No. Used Action  BASEPLATE GLENOSPHERE 58IT STD - [id number] Miscellaneous BASEPLATE GLENOSPHERE 58XE STD  [employer] [id number] Left 1 Implanted  BASEPLATE  GLENOID STD REV 42 - [id number] Joint BASEPLATE GLENOID STD REV 42  [employer] [id number] Left 1 Implanted  SCREW BONE 6.5 OD 30 NON BIO - [id number] Screw SCREW BONE 6.5 OD 30 NON BIO  [employer]  Left 1 Implanted  SCREW 5.5X26 - [id number] Screw SCREW 5.5X26  [employer]  Left 1 Implanted  SCREW PERIPHERAL 30 - [id number] Screw SCREW PERIPHERAL 30  [employer]  Left 1 Implanted  SCREW 5.5X22 - [id number] Screw SCREW 5.5X22  [employer]  Left 1 Implanted  SCREW 5.0X18 - [id number] Screw SCREW 5.0X18  [employer]  Left 1 Implanted  IMPLANT REVERSE SHOULDER 0X3.5 - [id number] Shoulder IMPLANT REVERSE SHOULDER 0X3.Joanna [id number] Left 1 Implanted  INSERT SHLD REV 42X6 ANGLE B - [id number] Insert INSERT SHLD REV 42X6 ANGLE B  [employer] [id number] Left 1 Implanted  STEM HUMERAL SZ 3B LONG 98MM - [id number] Stem STEM HUMERAL SZ 3B LONG 98MM  [employer] [id number] Left 1 Implanted    Indications for Surgery:   Bruce Cobb is a 56 y.o. male with previous total arthroplasty that had signs of cuff failure.  Benefits and risks of operative and nonoperative management were discussed prior to surgery with patient/guardian(s) and informed consent form was completed.  Infection and need for further surgery were discussed as was prosthetic stability and cuff issues.  We additionally specifically discussed risks of axillary nerve injury, infection, periprosthetic fracture, continued pain and longevity of implants prior to beginning procedure.      Procedure:   The patient was identified in the preoperative holding area where the surgical  site was marked. Block placed by anesthesia with exparel.  The patient was taken to the OR where a procedural timeout was called and the above noted anesthesia was induced.  The patient was positioned beachchair on allen table with spider arm positioner.  Preoperative antibiotics were dosed.  The patient's left shoulder was prepped and draped in the usual sterile fashion.  A  second preoperative timeout was called.       Use the previous deltopectoral incision going through skin sharply achieving hemostasis as we progressed.  We identified the deltopectoral interval which was marked with Ethibond sutures from the prior previous and surgery and open the deltopectoral interval.  We noted that the conjoined tendon was adherent to the underside lying tissue.  We dissected this bluntly taking care to stay away from the axillary nerve.  It was clear that there was too much scar in the axillary pouch to perform a tug test and thus we stayed away from this area completely.  The subscapularis was completely retorn though our sutures were still in place anteriorly.  We removed the suture material that we encountered.  At that point we peeled back the anterior capsular layer and placed a stitches.  This allowed access to the joint.  We carefully subperiosteally dissected the inferior capsular tissue taking specimens as we went for eventual culture.  We exposed her implants and used a tuning fork device to remove the humeral head.  That point we used a series of osteotomes to explant the stemless implant.  It was extremely well fixed.  There was minimal bone loss.  The head cut was relatively retroverted and we felt that it would be appropriate to recut in 20 degrees of version.  We used a flex longstem system to obtain purchase and broached to a size 3.  We cut off of this at 20 degrees obtaining appropriate fit.  We noted that the superior cuff had been repaired and we remove the anchors from this.  Once we had appropriate fit with our stem we placed our trial stem and turned attention of the glenoid.  We exposed the glenoid performing careful releases staying directly on bone.  We placed our retractors and were able to remove the glenoid by sectioning it into 4 sections removing the peripheral pegs in their entirety before removing the center peg.  Glenoid vault was appropriate in  position and we placed our guidewire in the central location and reamed to a flat surface.  Our boss drill was then used to open for the baseplate.  The glenoid vault was intact.  We drilled our center screw and tapped noting a 30 mm 6.5 screw.  We placed our 25 standard baseplate with above screw.  He had reasonable purchase with this however he felt that 4 peripheral screws would be appropriate.  Once these were placed we cleared peripheral bone and placed a 42 standard glenosphere.  We turned attention back to the humeral side and placed our trial implants noting that a 0 high offset tray and a +6 polyethylene were appropriate.  We removed our trials and irrigated copiously with 3 L normal saline.  At that point we noted that there was no subscapularis for repair.  We placed our final implants obtaining good purchase.  Joint was reduced under good tension was taken through range of motion noting no impingement.  We closed incisions in the multilayer fashion with a visible suture.  Sterile dressing and Aquacel were placed.  Sling was placed.  Noemi Chapel, PA-C, present and scrubbed throughout the case, critical for completion in a timely fashion, and for retraction, instrumentation, closure.

## 2021-10-11 NOTE — Interval H&P Note (Signed)
All questions answered, patient wants to proceed with procedure. ? ?

## 2021-10-11 NOTE — Anesthesia Procedure Notes (Signed)
Anesthesia Regional Block: Interscalene brachial plexus block   Pre-Anesthetic Checklist: , timeout performed,  Correct Patient, Correct Site, Correct Laterality,  Correct Procedure, Correct Position, site marked,  Risks and benefits discussed,  Surgical consent,  Pre-op evaluation,  At surgeon's request and post-op pain management  Laterality: Left  Prep: chloraprep       Needles:  Injection technique: Single-shot  Needle Type: Echogenic Needle     Needle Length: 5cm  Needle Gauge: 21     Additional Needles:   Narrative:  Start time: 10/11/2021 10:50 AM End time: 10/11/2021 10:53 AM Injection made incrementally with aspirations every 5 mL.  Performed by: Personally  Anesthesiologist: Audry Pili, MD  Additional Notes: No pain on injection. No increased resistance to injection. Injection made in 5cc increments. Good needle visualization. Patient tolerated the procedure well.

## 2021-10-11 NOTE — Progress Notes (Signed)
AssistedDr. Fransisco Beau with left, ultrasound guided, interscalene  block. Side rails up, monitors on throughout procedure. See vital signs in flow sheet. Tolerated Procedure well.

## 2021-10-11 NOTE — Transfer of Care (Signed)
Immediate Anesthesia Transfer of Care Note  Patient: Bruce Cobb  Procedure(s) Performed: LEFT REVISION TOTAL SHOULDER TO REVERSE TOTAL SHOULDER, RIGHT SHOULDER STERIOD INJECTION (Left: Shoulder)  Patient Location: PACU  Anesthesia Type:GA combined with regional for post-op pain  Level of Consciousness: awake, drowsy and patient cooperative  Airway & Oxygen Therapy: Patient Spontanous Breathing and Patient connected to face mask oxygen  Post-op Assessment: Report given to RN and Post -op Vital signs reviewed and stable  Post vital signs: Reviewed and stable  Last Vitals:  Vitals Value Taken Time  BP 147/65 10/11/21 1445  Temp    Pulse 82 10/11/21 1446  Resp 11 10/11/21 1446  SpO2 100 % 10/11/21 1446  Vitals shown include unvalidated device data.  Last Pain:  Vitals:   10/11/21 1056  TempSrc:   PainSc: 0-No pain         Complications: No notable events documented.

## 2021-10-11 NOTE — Anesthesia Preprocedure Evaluation (Addendum)
Anesthesia Evaluation  Patient identified by MRN, date of birth, ID band Patient awake    Reviewed: Allergy & Precautions, NPO status , Patient's Chart, lab work & pertinent test results  History of Anesthesia Complications (+) PONV and history of anesthetic complications  Airway Mallampati: II  TM Distance: >3 FB Neck ROM: Full    Dental  (+) Dental Advisory Given, Teeth Intact   Pulmonary neg pulmonary ROS,    Pulmonary exam normal        Cardiovascular Normal cardiovascular exam+ dysrhythmias Atrial Fibrillation      Neuro/Psych PSYCHIATRIC DISORDERS Anxiety negative neurological ROS     GI/Hepatic negative GI ROS, Neg liver ROS,   Endo/Other  negative endocrine ROS  Renal/GU negative Renal ROS     Musculoskeletal  (+) Arthritis ,   Abdominal   Peds  Hematology  Chronic leukopenia    Anesthesia Other Findings   Reproductive/Obstetrics                            Anesthesia Physical Anesthesia Plan  ASA: 2  Anesthesia Plan: General   Post-op Pain Management: Regional block and Tylenol PO (pre-op)   Induction: Intravenous  PONV Risk Score and Plan: 3 and Treatment may vary due to age or medical condition, Ondansetron, Dexamethasone and Midazolam  Airway Management Planned: Oral ETT  Additional Equipment: None  Intra-op Plan:   Post-operative Plan: Extubation in OR  Informed Consent: I have reviewed the patients History and Physical, chart, labs and discussed the procedure including the risks, benefits and alternatives for the proposed anesthesia with the patient or authorized representative who has indicated his/her understanding and acceptance.     Dental advisory given  Plan Discussed with: CRNA and Anesthesiologist  Anesthesia Plan Comments:        Anesthesia Quick Evaluation

## 2021-10-11 NOTE — Discharge Instructions (Addendum)
Ophelia Charter MD, MPH Noemi Chapel, PA-C [facility] [address] [phone] (tel)   [phone] (fax)   [facility] may leave the operative dressing in place until your follow-up appointment. KEEP THE INCISIONS CLEAN AND DRY. There may be a small amount of fluid/bleeding leaking at the surgical site. This is normal after surgery.  If it fills with liquid or blood please call us immediately to change it for you. Use the provided ice machine or Ice packs as often as possible for the first 3-4 days, then as needed for pain relief.   Keep a layer of cloth or a shirt between your skin and the cooling unit to prevent frost bite as it can get very cold.  SHOWERING: - You may shower on Post-Op Day #2.  - The dressing is water resistant but do not scrub it as it may start to peel up.   - You may remove the sling for showering, but keep a water resistant pillow under the arm to keep both the  elbow and shoulder away from the body (mimicking the abduction sling).  - Gently pat the area dry.  - Do not soak the shoulder in water. Do not go swimming in the pool or ocean until your incision has completely healed (about 4-6 weeks after surgery) - KEEP THE INCISIONS CLEAN AND DRY.  EXERCISES Wear the sling at all times You may remove the sling for showering, but keep the arm across the chest or in a secondary sling.    Accidental/Purposeful External Rotation and shoulder flexion (reaching behind you) is to be avoided at all costs for the first month. Do not lift anything heavier than 1 pound until we discuss it further in clinic.   REGIONAL ANESTHESIA (NERVE BLOCKS) The anesthesia team may have performed a nerve block for you if safe in the setting of your care.  This is a great tool used to minimize pain.  Typically the block may start wearing off overnight but the long acting medicine may last for  3-4 days.  The nerve block wearing off can be a challenging period but please utilize your as needed pain medications to try and manage this period.    POST-OP MEDICATIONS- Multimodal approach to pain control In general your pain will be controlled with a combination of substances.  Prescriptions unless otherwise discussed are electronically sent to your pharmacy.  This is a carefully made plan we use to minimize narcotic use.     Celebrex - Anti-inflammatory medication taken on a scheduled basis Acetaminophen - Non-narcotic pain medicine taken on a scheduled basis  Gabapentin - this is a medication to help with pain, take on a scheduled basis Oxycodone - This is a strong narcotic, to be used only on an as needed basis for SEVERE pain. Aspirin 81mg  - This medicine is used to minimize the risk of blood clots after surgery. Zofran -  take as needed for nausea Doxycycline - this is an antibiotic, take as prescribed   FOLLOW-UP If you develop a Fever (>101.5), Redness or Drainage from the surgical incision site, please call our office to arrange for an evaluation. Please call the office to schedule a follow-up appointment for a wound check, 7-10 days post-operatively.  IF YOU HAVE ANY QUESTIONS, PLEASE FEEL FREE TO CALL OUR OFFICE.  HELPFUL INFORMATION  If you had a block, it will wear off between 8-24 hrs postop typically.  This is period when your pain may go from nearly zero to the pain you would have had post-op without the block.  This is an abrupt transition but nothing dangerous is happening.  You may take an extra dose of narcotic when this happens.  Your arm will be in a sling following surgery. You will be in this sling for the next 4 weeks.    You may be more comfortable sleeping in a semi-seated position the first few nights following surgery.  Keep a pillow propped under the elbow and forearm for comfort.  If you have a recliner type of chair it might be beneficial.  If not that  is fine too, but it would be helpful to sleep propped up with pillows behind your operated shoulder as well under your elbow and forearm.  This will reduce pulling on the suture lines.  When dressing, put your operative arm in the sleeve first.  When getting undressed, take your operative arm out last.  Loose fitting, button-down shirts are recommended.  In most states it is against the law to drive while your arm is in a sling. And certainly against the law to drive while taking narcotics.  You may return to work/school in the next couple of days when you feel up to it. Desk work and typing in the sling is fine.  We suggest you use the pain medication the first night prior to going to bed, in order to ease any pain when the anesthesia wears off. You should avoid taking pain medications on an empty stomach as it will make you nauseous.  Do not drink alcoholic beverages or take illicit drugs when taking pain medications.  Pain medication may make you constipated.  Below are a few solutions to try in this order: Decrease the amount of pain medication if you arent having pain. Drink lots of decaffeinated fluids. Drink prune juice and/or each dried prunes  If the first 3 dont work start with additional solutions Take Colace - an over-the-counter stool softener Take Senokot - an over-the-counter laxative Take Miralax - a stronger over-the-counter laxative   Dental Antibiotics:  In most cases prophylactic antibiotics for Dental procdeures after total joint surgery are not necessary.  Exceptions are as follows:  1. History of prior total joint infection  2. Severely immunocompromised (Organ Transplant, cancer chemotherapy, Rheumatoid biologic meds such as [place])  3. Poorly controlled diabetes (A1C &gt; 8.0, blood glucose over 200)  If you have one of these conditions, contact your surgeon for an antibiotic prescription, prior to your dental procedure.   For more information  including helpful videos and documents visit our website:   [email or web address]

## 2021-10-13 ENCOUNTER — Encounter (HOSPITAL_COMMUNITY): Payer: Self-pay | Admitting: Orthopaedic Surgery

## 2021-10-13 LAB — SURGICAL PATHOLOGY

## 2021-11-01 LAB — AEROBIC/ANAEROBIC CULTURE W GRAM STAIN (SURGICAL/DEEP WOUND)
Culture: NO GROWTH
Culture: NO GROWTH
Culture: NO GROWTH
Gram Stain: NONE SEEN
Gram Stain: NONE SEEN
Gram Stain: NONE SEEN

## 2021-11-09 ENCOUNTER — Ambulatory Visit: Payer: BC Managed Care – PPO | Attending: Orthopaedic Surgery | Admitting: Physical Therapy

## 2021-11-09 ENCOUNTER — Encounter: Payer: Self-pay | Admitting: Physical Therapy

## 2021-11-09 ENCOUNTER — Other Ambulatory Visit: Payer: Self-pay

## 2021-11-09 DIAGNOSIS — R293 Abnormal posture: Secondary | ICD-10-CM | POA: Insufficient documentation

## 2021-11-09 DIAGNOSIS — M25512 Pain in left shoulder: Secondary | ICD-10-CM | POA: Diagnosis present

## 2021-11-09 DIAGNOSIS — M25612 Stiffness of left shoulder, not elsewhere classified: Secondary | ICD-10-CM | POA: Insufficient documentation

## 2021-11-09 DIAGNOSIS — M6281 Muscle weakness (generalized): Secondary | ICD-10-CM | POA: Insufficient documentation

## 2021-11-09 NOTE — Therapy (Signed)
[facility]. [place], [place], [address] Phone: [phone]   Fax:  [phone]  Physical Therapy Evaluation  Patient Details  Name: Bruce Cobb MRN: [id number] Date of Birth: Oct 13, 1965 Referring Provider (PT): Ophelia Charter   Encounter Date: 11/09/2021   PT End of Session - 11/09/21 0938     Visit Number 1    Number of Visits 25    Date for PT Re-Evaluation 01/04/22    Authorization Type [employer]    Authorization Time Period 11/09/21 to 02/01/22    Authorization - Number of Visits 30    PT Start Time 0847    PT Stop Time 0929    PT Time Calculation (min) 42 min    Activity Tolerance Patient tolerated treatment well    Behavior During Therapy [facility] for tasks assessed/performed             Past Medical History:  Diagnosis Date   BCC (basal cell carcinoma of skin)    Hematospermia    hx of, remotely    Paroxysmal atrial fibrillation ([place]) 09/2017   saw cards   Pars defect of lumbar spine    PONV (postoperative nausea and vomiting)    Right knee DJD    Shingles 2014   face    Past Surgical History:  Procedure Laterality Date   ANKLE FRACTURE SURGERY Left 1996   plated   KNEE ARTHROSCOPY Left 7619   plica removed   KNEE ARTHROSCOPY Right    7 total   NASAL SEPTUM SURGERY     ORIF DISTAL RADIUS FRACTURE Right    age 65   REVISION TOTAL KNEE ARTHROPLASTY Right 08/19/2017   REVISION TOTAL SHOULDER TO REVERSE TOTAL SHOULDER Left 10/11/2021   Procedure: LEFT REVISION TOTAL SHOULDER TO REVERSE TOTAL SHOULDER, RIGHT SHOULDER STERIOD INJECTION;  Surgeon: Hiram Gash, MD;  Location: [facility];  Service: Orthopedics;  Laterality: Left;   SHOULDER ARTHROSCOPY W/ ROTATOR CUFF REPAIR Right 2011   bicep tendon and labrium repair   SHOULDER ARTHROSCOPY WITH ROTATOR CUFF REPAIR Left 2012   w/ labrium repair   TOTAL KNEE ARTHROPLASTY Right 10/12/2013   Procedure: RIGHT TOTAL KNEE ARTHROPLASTY;  Surgeon: Lorn Junes,  MD;  Location: [facility];  Service: Orthopedics;  Laterality: Right;   VASECTOMY      There were no vitals filed for this visit.    Subjective Assessment - 11/09/21 0848     Subjective I had a regular L shoulder replacement done December 2021; a few months later it didn't feel right and I went back to see the doctor, they gave me meds to try to help but the shoulder was still not doing well. I went back again in fall of 2022 and the surgeon then said something was wrong, they did MRI which was negative but CT showed avulsion fractures in rotator cuff area. They did another replacement. 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 I've not really been able to use this left arm for about 16 months I need to get back to everything. I'm really looking forward to PT.Mornings are tough pain wise, it feels better in the day but stays sore. My right shoulder needs to be replaced eventually too.    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 Yes  Pain Score 5     Pain Location Shoulder    Pain Orientation Left    Pain Descriptors / Indicators Tightness;Sore;Discomfort;Other (Comment);Tiring   stiff   Pain Type Chronic pain    Pain Radiating Towards none    Pain Onset More than a month ago    Pain Frequency Constant    Aggravating Factors  movement/trying to do things with it    Pain Relieving Factors pain meds, massage    Effect of Pain on Daily Activities varies    Multiple Pain Sites No                [facility] PT Assessment - 11/09/21 0001       Assessment   Medical Diagnosis reverse total shoulder revision    Referring Provider (PT) Dax Varkey    Onset Date/Surgical Date --   4 weeks ago   Next MD Visit Griffin Basil tomorrow    Prior Therapy plenty in the past      Precautions   Precautions Shoulder    Precaution Comments protocol in media section      Restrictions    Weight Bearing Restrictions Yes    Other Position/Activity Restrictions 5#      Balance Screen   Has the patient fallen in the past 6 months No    Has the patient had a decrease in activity level because of a fear of falling?  No    Is the patient reluctant to leave their home because of a fear of falling?  No      Home Ecologist residence      Prior Function   Level of Independence Independent;Independent with basic ADLs    Vocation Full time employment    Probation officer business, runs a farm    Leisure golf, basketball, running      Observation/Other Assessments   Focus on Therapeutic Outcomes (FOTO)  53.7      Posture/Postural Control   Posture/Postural Control Postural limitations    Postural Limitations Rounded Shoulders;Forward head;Increased thoracic kyphosis;Decreased lumbar lordosis;Flexed trunk      ROM / Strength   AROM / PROM / Strength PROM;AROM;Strength      AROM   AROM Assessment Site Shoulder    Right/Left Shoulder Left    Left Shoulder Flexion 130 Degrees   possibly due to reduced anxiety when controlling motion/less guarded   Left Shoulder ABduction 82 Degrees      PROM   PROM Assessment Site Shoulder    Right/Left Shoulder Left    Left Shoulder Flexion 106 Degrees   very guarded   Left Shoulder ABduction 90 Degrees    Left Shoulder External Rotation 15 Degrees   at about 20 degrees ABD     Strength   Overall Strength Comments grip strength equal and symmetrical    Strength Assessment Site Elbow;Wrist    Right/Left Elbow Right;Left    Left Elbow Flexion 4+/5    Left Elbow Extension 3+/5    Right/Left Wrist Right;Left    Left Wrist Flexion 4+/5    Left Wrist Extension 4+/5                        Objective measurements completed on examination: See above findings.       [facility] Adult PT Treatment/Exercise - 11/09/21 0001       Exercises   Exercises Shoulder      Shoulder  Exercises: Supine  Flexion PROM;Left;10 reps    Flexion Limitations with dowel    ABduction PROM;Left;10 reps    ABduction Limitations with dowel      Shoulder Exercises: Seated   Other Seated Exercises scapular retractions, elbow AROM                     PT Education - 11/09/21 0938     Education Details exam findings, POC, HEP; general course of care and general restrictions following this surgery/according to MD protocol    Person(s) Educated Patient;Spouse    Methods Explanation;Demonstration;Handout    Comprehension Verbalized understanding;Returned demonstration              PT Short Term Goals - 11/09/21 0947       PT SHORT TERM GOAL #1   Title Will be compliant with appropriate HEP to be progressed as able per protocol    Time 6    Period Weeks    Status New    Target Date 12/21/21      PT SHORT TERM GOAL #2   Title L shoulder AROM to be at least 160 degrees flexion and ABD, no more than 30 degrees ER AROM    Time 6    Period Weeks    Status New      PT SHORT TERM GOAL #3   Title Will have better understanding of postural awareness and mechanics    Time 6    Period Weeks    Status New      PT SHORT TERM GOAL #4   Title L shoulder strength to be at least 3 to 3+/5 in all tested groups    Time 6    Period Weeks    Status New               PT Long Term Goals - 11/09/21 0949       PT LONG TERM GOAL #1   Title L shoulder MMT to be at least 4/5 in all muscle groups    Time 12    Period Weeks    Status New    Target Date 02/01/22      PT LONG TERM GOAL #2   Title Will be able to lift a 10# weight overhead with good mechanics and without pain in L shoulder    Time 12    Period Weeks    Status New      PT LONG TERM GOAL #3   Title Will be able to perform appropriate functional tasks at home and at the farm within limits of protocol/MD guidance without increase in shoulder pain    Time 12    Period Weeks    Status New      PT  LONG TERM GOAL #4   Title Will be able to perform L shoulder IR and extension to full range as appropriate within limits of protocol    Time 12    Period [place] - 11/09/21 5956     Clinical Impression Statement Mr. Levi arrives today after a long course of surgeries and complications with his L shoulder. Had L total shoulder revision using reverse approach in February (now 4 weeks post-op) and really looking forward to getting stronger and gaining function with therapy. Exam is very typical following this surgery, performed evaluation and gave HEP as appropriate  per protocol from MD. Will benefit from skilled PT services to address functional limitations and progress as appropriate.    Personal Factors and Comorbidities Past/Current Experience;Social Background;Time since onset of injury/illness/exacerbation    Examination-Activity Limitations Bathing;Bed Mobility;Reach Overhead;Caring for Others;Carry;Dressing;Hygiene/Grooming;Lift    Examination-Participation Restrictions Occupation;Cleaning;Community Activity;Driving;Shop;Yard Work    Stability/Clinical Decision Making Stable/Uncomplicated    Clinical Decision Making Low    Rehab Potential Good    PT Frequency 2x / week    PT Duration 12 weeks    PT Treatment/Interventions ADLs/Self Care Home Management;Cryotherapy;Electrical Stimulation;Iontophoresis '4mg'$ /ml Dexamethasone;Moist Heat;Ultrasound;Functional mobility training;Therapeutic activities;Therapeutic exercise;Patient/family education;Manual techniques;Passive range of motion;Dry needling;Energy conservation;Taping    PT Next Visit Plan follow MD protocol, progress when appropriate    PT [place] and Agree with Plan of Care Patient;Family member/caregiver    Family Member Consulted spouse             Patient will benefit from skilled therapeutic intervention in order to improve the following deficits  and impairments:  Decreased range of motion, Increased fascial restricitons, Impaired UE functional use, Decreased knowledge of precautions, Pain, Hypomobility, Impaired flexibility, Improper body mechanics, Decreased strength, Increased edema, Postural dysfunction  Visit Diagnosis: Stiffness of left shoulder, not elsewhere classified  Acute pain of left shoulder  Abnormal posture  Muscle weakness (generalized)     Problem List Patient Active Problem List   Diagnosis Date Noted   BCC (basal cell carcinoma of skin) 06/29/2019   Chronic leukopenia 09/24/2018   PCP NOTES >>>>>>>>>>>>>> 09/24/2018   Anxiety 09/24/2018   Insomnia 09/24/2018   History of atrial fibrillation 09/24/2018   Irregular heart rhythm 02/24/2016   Dysfunction of both eustachian tubes 01/18/2016   DJD (degenerative joint disease) 10/12/2013   Pars defect of lumbar spine    Ann Lions PT, DPT, PN2   Supplemental Physical Therapist [place]. [place], [place], [address] Phone: [phone]   Fax:  [phone]  Name: Bruce Cobb MRN: [id number] Date of Birth: 1966-08-25

## 2021-11-14 ENCOUNTER — Ambulatory Visit: Payer: BC Managed Care – PPO | Admitting: Physical Therapy

## 2021-11-14 ENCOUNTER — Other Ambulatory Visit: Payer: Self-pay

## 2021-11-14 ENCOUNTER — Encounter: Payer: Self-pay | Admitting: Physical Therapy

## 2021-11-14 DIAGNOSIS — M25612 Stiffness of left shoulder, not elsewhere classified: Secondary | ICD-10-CM | POA: Diagnosis not present

## 2021-11-14 DIAGNOSIS — M6281 Muscle weakness (generalized): Secondary | ICD-10-CM

## 2021-11-14 DIAGNOSIS — M25512 Pain in left shoulder: Secondary | ICD-10-CM

## 2021-11-14 DIAGNOSIS — R293 Abnormal posture: Secondary | ICD-10-CM

## 2021-11-14 NOTE — Therapy (Signed)
[facility] ?[place] ?[address] ?[place], [place], [address] ?Phone: [phone]   Fax:  [phone] ? ?Physical Therapy Treatment ? ?Patient Details  ?Name: Bruce Cobb ?MRN: [id number] ?Date of Birth: 1966/07/13 ?Referring Provider (PT): Bruce Cobb ? ? ?Encounter Date: 11/14/2021 ? ? PT End of Session - 11/14/21 1528   ? ? Visit Number 2   ? Number of Visits 25   ? Date for PT Re-Evaluation 01/04/22   ? Authorization Type BCBS   ? Authorization Time Period 11/09/21 to 02/01/22   ? Authorization - Number of Visits 30   ? PT Start Time 8099   ? PT Stop Time 8338   ? PT Time Calculation (min) 40 min   ? Activity Tolerance Patient tolerated treatment well   ? Behavior During Therapy [facility] for tasks assessed/performed   ? ?  ?  ? ?  ? ? ?Past Medical History:  ?Diagnosis Date  ? BCC (basal cell carcinoma of skin)   ? Hematospermia   ? hx of, remotely   ? Paroxysmal atrial fibrillation ([place]) 09/2017  ? saw cards  ? Pars defect of lumbar spine   ? PONV (postoperative nausea and vomiting)   ? Right knee DJD   ? Shingles 2014  ? face  ? ? ?Past Surgical History:  ?Procedure Laterality Date  ? ANKLE FRACTURE SURGERY Left 1996  ? plated  ? KNEE ARTHROSCOPY Left 2505  ? plica removed  ? KNEE ARTHROSCOPY Right   ? 7 total  ? NASAL SEPTUM SURGERY    ? ORIF DISTAL RADIUS FRACTURE Right   ? age 76  ? REVISION TOTAL KNEE ARTHROPLASTY Right 08/19/2017  ? REVISION TOTAL SHOULDER TO REVERSE TOTAL SHOULDER Left 10/11/2021  ? Procedure: LEFT REVISION TOTAL SHOULDER TO REVERSE TOTAL SHOULDER, RIGHT SHOULDER STERIOD INJECTION;  Surgeon: Bruce Gash, MD;  Location: [facility];  Service: Orthopedics;  Laterality: Left;  ? SHOULDER ARTHROSCOPY W/ ROTATOR CUFF REPAIR Right 2011  ? bicep tendon and labrium repair  ? SHOULDER ARTHROSCOPY WITH ROTATOR CUFF REPAIR Left 2012  ? w/ labrium repair  ? TOTAL KNEE ARTHROPLASTY Right 10/12/2013  ? Procedure: RIGHT TOTAL KNEE ARTHROPLASTY;  Surgeon: Bruce Junes,  MD;  Location: [facility];  Service: Orthopedics;  Laterality: Right;  ? VASECTOMY    ? ? ?There were no vitals filed for this visit. ? ? Subjective Assessment - 11/14/21 1449   ? ? Subjective I'm a little stiff and cold, I think the weather is getting to me today. I do my HEP harder than most people I guess. I saw Bruce Cobb the day after you saw me, he said stay away from shoulder IR for now   ? 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 Currently in Pain? Yes   ? Pain Score 1    ? Pain Location Shoulder   ? Pain Orientation Left   ? Pain Descriptors / Indicators Tightness;Discomfort   ? ?  ?  ? ?  ? ? ? ? ? ? ? ? ? ? ? ? ? ? ? ? ? ? ? ? [facility] Adult PT Treatment/Exercise - 11/14/21 0001   ? ?  ? Shoulder Exercises: Supine  ? External Rotation Limitations ER stretching to tolerance; ER isometrics with 2 second holds 1x10   ? Flexion AROM   ? Flexion Limitations L shoulder stretching 1x10 to end  range   used L UE to control eccentric motions with AROM  ? ABduction AROM;10 reps   ? ABduction Limitations L shoulder stretches to end range, then AROM   concentrically only  ? Other Supine Exercises L shoulder flexion 1x10 isometrics 2 second holds; L shoulder ABD isometrics 1x10 2 second holds   ?  ? Shoulder Exercises: Seated  ? Other Seated Exercises UBE L1 3 min forward/3 min backwards   ? ?  ?  ? ?  ? ? ? ? ? ? ? ? ? ? PT Education - 11/14/21 1528   ? ? Education Details what we can/can't do as per protocol, exercise progressions   ? Person(s) Educated Patient   ? Methods Explanation   ? Comprehension Verbalized understanding   ? ?  ?  ? ?  ? ? ? PT Short Term Goals - 11/09/21 0947   ? ?  ? PT SHORT TERM GOAL #1  ? Title Will be compliant with appropriate HEP to be progressed as able per protocol   ? Time 6   ? Period Weeks   ? Status New   ? Target Date 12/21/21   ?  ? PT SHORT TERM GOAL #2  ? Title L shoulder AROM to be at least 160 degrees flexion and ABD, no more than  30 degrees ER AROM   ? Time 6   ? Period Weeks   ? Status New   ?  ? PT SHORT TERM GOAL #3  ? Title Will have better understanding of postural awareness and mechanics   ? Time 6   ? Period Weeks   ? Status New   ?  ? PT SHORT TERM GOAL #4  ? Title L shoulder strength to be at least 3 to 3+/5 in all tested groups   ? Time 6   ? Period Weeks   ? Status New   ? ?  ?  ? ?  ? ? ? ? PT Long Term Goals - 11/09/21 0949   ? ?  ? PT LONG TERM GOAL #1  ? Title L shoulder MMT to be at least 4/5 in all muscle groups   ? Time 12   ? Period Weeks   ? Status New   ? Target Date 02/01/22   ?  ? PT LONG TERM GOAL #2  ? Title Will be able to lift a 10# weight overhead with good mechanics and without pain in L shoulder   ? Time 12   ? Period Weeks   ? Status New   ?  ? PT LONG TERM GOAL #3  ? 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 Time 12   ? Period Weeks   ? Status New   ?  ? PT LONG TERM GOAL #4  ? Title Will be able to perform L shoulder IR and extension to full range as appropriate within limits of protocol   ? Time 12   ? Period Weeks   ? Status New   ? ?  ?  ? ?  ? ? ? ? ? ? ? ? Plan - 11/14/21 1528   ? ? Clinical Impression Statement [name] arrives today doing well, he saw the MD after his PT eval and they are happy with progress so far. We spent some time stretching his shoulder out, then worked  on concentric AROM work as well as postural training and introduced the UBE at light resistance/positioned as to avoid shoulder extension past neutral. He also had lots of questions about what he can and cannot do at this phase of his protocol as well, I answered to the best of my knowledge. Will continue to progress as able per protocol.   ? Personal Factors and Comorbidities Past/Current Experience;Social Background;Time since onset of injury/illness/exacerbation   ? Examination-Activity Limitations Bathing;Bed  Mobility;Reach Overhead;Caring for Others;Carry;Dressing;Hygiene/Grooming;Lift   ? Examination-Participation Restrictions Occupation;Cleaning;Community Activity;Driving;Shop;Yard Work   ? Stability/Clinical Decision Making Stable/Uncomplicated   ? Clinical Decision Making Low   ? Rehab Potential Good   ? PT Frequency 2x / week   ? PT Duration 12 weeks   ? PT Treatment/Interventions ADLs/Self Care Home Management;Cryotherapy;Electrical Stimulation;Iontophoresis '4mg'$ /ml Dexamethasone;Moist Heat;Ultrasound;Functional mobility training;Therapeutic activities;Therapeutic exercise;Patient/family education;Manual techniques;Passive range of motion;Dry needling;Energy conservation;Taping   ? PT Next Visit Plan follow MD protocol (in media), progress when appropriate   ? PT [place]   ? Consulted and Agree with Plan of Care Patient   ? ?  ?  ? ?  ? ? ?Patient will benefit from skilled therapeutic intervention in order to improve the following deficits and impairments:  Decreased range of motion, Increased fascial restricitons, Impaired UE functional use, Decreased knowledge of precautions, Pain, Hypomobility, Impaired flexibility, Improper body mechanics, Decreased strength, Increased edema, Postural dysfunction ? ?Visit Diagnosis: ?Stiffness of left shoulder, not elsewhere classified ? ?Acute pain of left shoulder ? ?Abnormal posture ? ?Muscle weakness (generalized) ? ? ? ? ?Problem List ?Patient Active Problem List  ? Diagnosis Date Noted  ? BCC (basal cell carcinoma of skin) 06/29/2019  ? Chronic leukopenia 09/24/2018  ? PCP NOTES >>>>>>>>>>>>>> 09/24/2018  ? Anxiety 09/24/2018  ? Insomnia 09/24/2018  ? History of atrial fibrillation 09/24/2018  ? Irregular heart rhythm 02/24/2016  ? Dysfunction of both eustachian tubes 01/18/2016  ? DJD (degenerative joint disease) 10/12/2013  ? Pars defect of lumbar spine   ? ?Kaysha Parsell U PT, DPT, PN2  ? ?Supplemental Physical Therapist ?[facility]  ? ? ? ? ? ?[facility] ?[address] ?[address] ?[place], [place], [address] ?Phone: [phone]   Fax:  [phone] ? ?Name: Bruce Cobb ?MRN: [id number] ?Date of Birth: 1966/02/18 ? ? ?

## 2021-11-16 ENCOUNTER — Other Ambulatory Visit: Payer: Self-pay

## 2021-11-16 ENCOUNTER — Ambulatory Visit: Payer: BC Managed Care – PPO | Admitting: Physical Therapy

## 2021-11-16 DIAGNOSIS — M25612 Stiffness of left shoulder, not elsewhere classified: Secondary | ICD-10-CM | POA: Diagnosis not present

## 2021-11-16 NOTE — Therapy (Signed)
?[facility] ?[address] ?[place], [place], [address] ?Phone: [phone]   Fax:  [phone] ? ?Physical Therapy Treatment ? ?Patient Details  ?Name: Bruce Cobb ?MRN: [id number] ?Date of Birth: 1966-02-26 ?Referring Provider (PT): Dax Griffin Basil ? ? ?Encounter Date: 11/16/2021 ? ? PT End of Session - 11/16/21 1529   ? ? Visit Number 3   ? Number of Visits 25   ? Date for PT Re-Evaluation 01/04/22   ? Authorization Type BCBS   ? Authorization Time Period 11/09/21 to 02/01/22   ? PT Start Time 4315   ? PT Stop Time 1528   ? PT Time Calculation (min) 43 min   ? ?  ?  ? ?  ? ? ?Past Medical History:  ?Diagnosis Date  ? BCC (basal cell carcinoma of skin)   ? Hematospermia   ? hx of, remotely   ? Paroxysmal atrial fibrillation ([place]) 09/2017  ? saw cards  ? Pars defect of lumbar spine   ? PONV (postoperative nausea and vomiting)   ? Right knee DJD   ? Shingles 2014  ? face  ? ? ?Past Surgical History:  ?Procedure Laterality Date  ? ANKLE FRACTURE SURGERY Left 1996  ? plated  ? KNEE ARTHROSCOPY Left 4008  ? plica removed  ? KNEE ARTHROSCOPY Right   ? 7 total  ? NASAL SEPTUM SURGERY    ? ORIF DISTAL RADIUS FRACTURE Right   ? age 20  ? REVISION TOTAL KNEE ARTHROPLASTY Right 08/19/2017  ? REVISION TOTAL SHOULDER TO REVERSE TOTAL SHOULDER Left 10/11/2021  ? Procedure: LEFT REVISION TOTAL SHOULDER TO REVERSE TOTAL SHOULDER, RIGHT SHOULDER STERIOD INJECTION;  Surgeon: Hiram Gash, MD;  [facility];  Service: Orthopedics;  Laterality: Left;  ? SHOULDER ARTHROSCOPY W/ ROTATOR CUFF REPAIR Right 2011  ? bicep tendon and labrium repair  ? SHOULDER ARTHROSCOPY WITH ROTATOR CUFF REPAIR Left 2012  ? w/ labrium repair  ? TOTAL KNEE ARTHROPLASTY Right 10/12/2013  ? Procedure: RIGHT TOTAL KNEE ARTHROPLASTY;  Surgeon: Lorn Junes, MD;  Location: [facility];  Service: Orthopedics;  Laterality: Right;  ? VASECTOMY    ? ? ?There were no vitals filed for this visit. ? ? Subjective Assessment -  11/16/21 1442   ? ? Subjective doing HEP moving arm better   ? Currently in Pain? Yes   ? Pain Score 1    ? Pain Location Shoulder   ? Pain Orientation Left   ? ?  ?  ? ?  ? ? ? ? ? [facility] PT Assessment - 11/16/21 0001   ? ?  ? AROM  ? Left Shoulder Flexion 167 Degrees   lying  ? Left Shoulder ABduction 105 Degrees   ? Left Shoulder External Rotation 15 Degrees   30 degrees abd  ? ?  ?  ? ?  ? ? ? ? ? ? ? ? ? ? ? ? ? ? ? ? [facility] Adult PT Treatment/Exercise - 11/16/21 0001   ? ?  ? Shoulder Exercises: Standing  ? Flexion Strengthening;Left;5 reps   3 sets 2#  ? ABduction Strengthening;Left;5 reps   3 sets 2#  ? Other Standing Exercises finger ladder flex and abd 2# 5 x   yellow tband shld flex,abd and ER 2 set s10  ? Other Standing Exercises PNF 2# 3 sets 5   ?  ? Shoulder Exercises: ROM/Strengthening  ? UBE (Upper Arm Bike) L 1 3 min fwd/3  min back- within precautons   ?  ? Manual Therapy  ? Manual Therapy Passive ROM   ? Passive ROM flex,abd and ER   ? ?  ?  ? ?  ? ? ? ? ? ? ? ? ? ? ? ? PT Short Term Goals - 11/09/21 0947   ? ?  ? PT SHORT TERM GOAL #1  ? Title Will be compliant with appropriate HEP to be progressed as able per protocol   ? Time 6   ? Period Weeks   ? Status New   ? Target Date 12/21/21   ?  ? PT SHORT TERM GOAL #2  ? Title L shoulder AROM to be at least 160 degrees flexion and ABD, no more than 30 degrees ER AROM   ? Time 6   ? Period Weeks   ? Status New   ?  ? PT SHORT TERM GOAL #3  ? Title Will have better understanding of postural awareness and mechanics   ? Time 6   ? Period Weeks   ? Status New   ?  ? PT SHORT TERM GOAL #4  ? Title L shoulder strength to be at least 3 to 3+/5 in all tested groups   ? Time 6   ? Period Weeks   ? Status New   ? ?  ?  ? ?  ? ? ? ? PT Long Term Goals - 11/09/21 0949   ? ?  ? PT LONG TERM GOAL #1  ? Title L shoulder MMT to be at least 4/5 in all muscle groups   ? Time 12   ? Period Weeks   ? Status New   ? Target Date 02/01/22   ?  ? PT LONG TERM GOAL #2  ? Title  Will be able to lift a 10# weight overhead with good mechanics and without pain in L shoulder   ? Time 12   ? Period Weeks   ? Status New   ?  ? PT LONG TERM GOAL #3  ? 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 Time 12   ? Period Weeks   ? Status New   ?  ? PT LONG TERM GOAL #4  ? Title Will be able to perform L shoulder IR and extension to full range as appropriate within limits of protocol   ? Time 12   ? Period Weeks   ? Status New   ? ?  ?  ? ?  ? ? ? ? ? ? ? ? Plan - 11/16/21 1529   ? ? Clinical Impression Statement pt arrived with alittle soreness but states he is moving better and ex are helping. cautioned pt about overdoing as we progressed strength and stretching within limitations of protocol. fatigue with ex, minimla soreness- verb and tatcile cuing needed. AROM taken at end of session supine   ? PT Treatment/Interventions ADLs/Self Care Home Management;Cryotherapy;Electrical Stimulation;Iontophoresis '4mg'$ /ml Dexamethasone;Moist Heat;Ultrasound;Functional mobility training;Therapeutic activities;Therapeutic exercise;Patient/family education;Manual techniques;Passive range of motion;Dry needling;Energy conservation;Taping   ? PT Next Visit Plan follow MD protocol, progress when appropriate   ? ?  ?  ? ?  ? ? ?Patient will benefit from skilled therapeutic intervention in order to improve the following deficits and impairments:  Decreased range of motion, Increased fascial restricitons, Impaired UE functional use, Decreased knowledge of precautions, Pain, Hypomobility,  Impaired flexibility, Improper body mechanics, Decreased strength, Increased edema, Postural dysfunction ? ?Visit Diagnosis: ?Stiffness of left shoulder, not elsewhere classified ? ?Acute pain of left shoulder ? ?Abnormal posture ? ?Muscle weakness (generalized) ? ? ? ? ?Problem List ?Patient Active Problem List  ? Diagnosis Date  Noted  ? BCC (basal cell carcinoma of skin) 06/29/2019  ? Chronic leukopenia 09/24/2018  ? PCP NOTES >>>>>>>>>>>>>> 09/24/2018  ? Anxiety 09/24/2018  ? Insomnia 09/24/2018  ? History of atrial fibrillation 09/24/2018  ? Irregular heart rhythm 02/24/2016  ? Dysfunction of both eustachian tubes 01/18/2016  ? DJD (degenerative joint disease) 10/12/2013  ? Pars defect of lumbar spine   ? ? ?Larraine Argo,[name], PTA ?11/16/2021, 3:31 PM ? ?[facility] ?[address] ?[address] ?[place], [place], [address] ?Phone: [phone]   Fax:  [phone] ? ?Name: Bruce Cobb ?MRN: [id number] ?Date of Birth: 1966/08/17 ? ? ? ?

## 2021-11-21 ENCOUNTER — Ambulatory Visit: Payer: BC Managed Care – PPO | Admitting: Physical Therapy

## 2021-11-21 ENCOUNTER — Other Ambulatory Visit: Payer: Self-pay

## 2021-11-21 DIAGNOSIS — M25612 Stiffness of left shoulder, not elsewhere classified: Secondary | ICD-10-CM | POA: Diagnosis not present

## 2021-11-21 DIAGNOSIS — M6281 Muscle weakness (generalized): Secondary | ICD-10-CM

## 2021-11-21 DIAGNOSIS — M25512 Pain in left shoulder: Secondary | ICD-10-CM

## 2021-11-21 DIAGNOSIS — R293 Abnormal posture: Secondary | ICD-10-CM

## 2021-11-21 NOTE — Therapy (Signed)
[facility] ?[place] ?[address] ?[place], [place], [address] ?Phone: [phone]   Fax:  [phone] ? ?Physical Therapy Treatment ? ?Patient Details  ?Name: Bruce Cobb ?MRN: [id number] ?Date of Birth: 07-02-1966 ?Referring Provider (PT): Dax Griffin Basil ? ? ?Encounter Date: 11/21/2021 ? ? PT End of Session - 11/21/21 1444   ? ? Visit Number 4   ? Number of Visits 25   ? Date for PT Re-Evaluation 01/04/22   ? Authorization Type BCBS   ? Authorization Time Period 11/09/21 to 02/01/22   ? PT Start Time 1355   ? PT Stop Time 3810   ? PT Time Calculation (min) 49 min   ? ?  ?  ? ?  ? ? ?Past Medical History:  ?Diagnosis Date  ? BCC (basal cell carcinoma of skin)   ? Hematospermia   ? hx of, remotely   ? Paroxysmal atrial fibrillation ([place]) 09/2017  ? saw cards  ? Pars defect of lumbar spine   ? PONV (postoperative nausea and vomiting)   ? Right knee DJD   ? Shingles 2014  ? face  ? ? ?Past Surgical History:  ?Procedure Laterality Date  ? ANKLE FRACTURE SURGERY Left 1996  ? plated  ? KNEE ARTHROSCOPY Left 1751  ? plica removed  ? KNEE ARTHROSCOPY Right   ? 7 total  ? NASAL SEPTUM SURGERY    ? ORIF DISTAL RADIUS FRACTURE Right   ? age 65  ? REVISION TOTAL KNEE ARTHROPLASTY Right 08/19/2017  ? REVISION TOTAL SHOULDER TO REVERSE TOTAL SHOULDER Left 10/11/2021  ? Procedure: LEFT REVISION TOTAL SHOULDER TO REVERSE TOTAL SHOULDER, RIGHT SHOULDER STERIOD INJECTION;  Surgeon: Hiram Gash, MD;  [facility];  Service: Orthopedics;  Laterality: Left;  ? SHOULDER ARTHROSCOPY W/ ROTATOR CUFF REPAIR Right 2011  ? bicep tendon and labrium repair  ? SHOULDER ARTHROSCOPY WITH ROTATOR CUFF REPAIR Left 2012  ? w/ labrium repair  ? TOTAL KNEE ARTHROPLASTY Right 10/12/2013  ? Procedure: RIGHT TOTAL KNEE ARTHROPLASTY;  Surgeon: Lorn Junes, MD;  Location: [facility];  Service: Orthopedics;  Laterality: Right;  ? VASECTOMY    ? ? ?There were no vitals filed for this visit. ? ? Subjective Assessment -  11/21/21 1356   ? ? Subjective woke up this morning and 4th and 5th fingers numb. shld just dull ache like its not mine   ? Currently in Pain? Yes   ? Pain Score 3    ? Pain Location Shoulder   ? Pain Orientation Left   ? Pain Descriptors / Indicators Tightness   ? ?  ?  ? ?  ? ? ? ? ? ? ? ? ? ? ? ? ? ? ? ? ? ? ? ? [facility] Adult PT Treatment/Exercise - 11/21/21 0001   ? ?  ? Shoulder Exercises: Supine  ? Other Supine Exercises left shld chest press, flex and ER 2 sets 10 3#   ?  ? Shoulder Exercises: Sidelying  ? ABduction Strengthening;Left;20 reps   3#  ?  ? Shoulder Exercises: Standing  ? Flexion Strengthening;Left;5 reps   2 sets 2#, 2 set 3#  ? ABduction Strengthening;Left;5 reps   2 sets 2#, 2 set 3#  ? Other Standing Exercises wt ball 2 hand chest press 10 x and OH 10 x   ? Other Standing Exercises PNF 2# 2 sets 5 assist to get to end range   ER 3# at side 2  sets 5  ?  ? Shoulder Exercises: ROM/Strengthening  ? UBE (Upper Arm Bike) L 2 3 min fwd/3 min back- within precautons   ? Other ROM/Strengthening Exercises lat pull AA 15# 2 sets 10   ? Other ROM/Strengthening Exercises ball vs wall 5 x CW and CCW   ?  ? Manual Therapy  ? Manual Therapy Passive ROM   ? Passive ROM flex,abd and ER   ? ?  ?  ? ?  ? ? ? ? ? ? ? ? ? ? ? ? PT Short Term Goals - 11/09/21 0947   ? ?  ? PT SHORT TERM GOAL #1  ? Title Will be compliant with appropriate HEP to be progressed as able per protocol   ? Time 6   ? Period Weeks   ? Status New   ? Target Date 12/21/21   ?  ? PT SHORT TERM GOAL #2  ? Title L shoulder AROM to be at least 160 degrees flexion and ABD, no more than 30 degrees ER AROM   ? Time 6   ? Period Weeks   ? Status New   ?  ? PT SHORT TERM GOAL #3  ? Title Will have better understanding of postural awareness and mechanics   ? Time 6   ? Period Weeks   ? Status New   ?  ? PT SHORT TERM GOAL #4  ? Title L shoulder strength to be at least 3 to 3+/5 in all tested groups   ? Time 6   ? Period Weeks   ? Status New   ? ?  ?  ? ?   ? ? ? ? PT Long Term Goals - 11/09/21 0949   ? ?  ? PT LONG TERM GOAL #1  ? Title L shoulder MMT to be at least 4/5 in all muscle groups   ? Time 12   ? Period Weeks   ? Status New   ? Target Date 02/01/22   ?  ? PT LONG TERM GOAL #2  ? Title Will be able to lift a 10# weight overhead with good mechanics and without pain in L shoulder   ? Time 12   ? Period Weeks   ? Status New   ?  ? PT LONG TERM GOAL #3  ? 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 Time 12   ? Period Weeks   ? Status New   ?  ? PT LONG TERM GOAL #4  ? Title Will be able to perform L shoulder IR and extension to full range as appropriate within limits of protocol   ? Time 12   ? Period Weeks   ? Status New   ? ?  ?  ? ?  ? ? ? ? ? ? ? ? Plan - 11/21/21 1444   ? ? Clinical Impression Statement progressed ther ex within protocol with tactile and verb cuing, okayed pt to do ex at home but caitioned low wt and low reps as he fatigues quickly.   ? PT Treatment/Interventions ADLs/Self Care Home Management;Cryotherapy;Electrical Stimulation;Iontophoresis '4mg'$ /ml Dexamethasone;Moist Heat;Ultrasound;Functional mobility training;Therapeutic activities;Therapeutic exercise;Patient/family education;Manual techniques;Passive range of motion;Dry needling;Energy conservation;Taping   ? PT Next Visit Plan follow MD protocol,   ? ?  ?  ? ?  ? ? ?Patient will benefit from skilled therapeutic intervention in  order to improve the following deficits and impairments:  Decreased range of motion, Increased fascial restricitons, Impaired UE functional use, Decreased knowledge of precautions, Pain, Hypomobility, Impaired flexibility, Improper body mechanics, Decreased strength, Increased edema, Postural dysfunction ? ?Visit Diagnosis: ?Stiffness of left shoulder, not elsewhere classified ? ?Acute pain of left shoulder ? ?Abnormal posture ? ?Muscle weakness  (generalized) ? ? ? ? ?Problem List ?Patient Active Problem List  ? Diagnosis Date Noted  ? BCC (basal cell carcinoma of skin) 06/29/2019  ? Chronic leukopenia 09/24/2018  ? PCP NOTES >>>>>>>>>>>>>> 09/24/2018  ? Anxiety 09/24/2018  ? Insomnia 09/24/2018  ? History of atrial fibrillation 09/24/2018  ? Irregular heart rhythm 02/24/2016  ? Dysfunction of both eustachian tubes 01/18/2016  ? DJD (degenerative joint disease) 10/12/2013  ? Pars defect of lumbar spine   ? ? ?Jenavive Lamboy,[name], PTA ?11/21/2021, 2:49 PM ? ?[facility] ?[address] ?[address] ?[place], [place], [address] ?Phone: [phone]   Fax:  [phone] ? ?Name: Bruce Cobb ?MRN: [id number] ?Date of Birth: 01/13/1966 ? ? ? ?

## 2021-11-23 ENCOUNTER — Ambulatory Visit: Payer: BC Managed Care – PPO | Admitting: Physical Therapy

## 2021-11-23 ENCOUNTER — Other Ambulatory Visit: Payer: Self-pay

## 2021-11-23 DIAGNOSIS — M6281 Muscle weakness (generalized): Secondary | ICD-10-CM

## 2021-11-23 DIAGNOSIS — M25612 Stiffness of left shoulder, not elsewhere classified: Secondary | ICD-10-CM | POA: Diagnosis not present

## 2021-11-23 DIAGNOSIS — R293 Abnormal posture: Secondary | ICD-10-CM

## 2021-11-23 DIAGNOSIS — M25512 Pain in left shoulder: Secondary | ICD-10-CM

## 2021-11-23 NOTE — Therapy (Signed)
[facility] ?[place] ?[place], [place], [address] ?Phone: [phone]   Fax:  [phone] ? ?Physical Therapy Treatment ? ?Patient Details  ?Name: Bruce Cobb ?MRN: [id number] ?Date of Birth: [birth year] ?Referring Provider (PT): Dax Griffin Basil ? ? ?Encounter Date: 11/23/2021 ? ? PT End of Session - 11/23/21 1653   ? ? Visit Number 5   ? Number of Visits 25   ? Date for PT Re-Evaluation 01/04/22   ? Authorization Type BCBS   ? Authorization Time Period 11/09/21 to 02/01/22   ? PT Start Time 1610   ? PT Stop Time 1655   ? PT Time Calculation (min) 45 min   ? ?  ?  ? ?  ? ? ?Past Medical History:  ?Diagnosis Date  ? BCC (basal cell carcinoma of skin)   ? Hematospermia   ? hx of, remotely   ? Paroxysmal atrial fibrillation ([place]) 09/2017  ? saw cards  ? Pars defect of lumbar spine   ? PONV (postoperative nausea and vomiting)   ? Right knee DJD   ? Shingles 2014  ? face  ? ? ?Past Surgical History:  ?Procedure Laterality Date  ? ANKLE FRACTURE SURGERY Left 1996  ? plated  ? KNEE ARTHROSCOPY Left 8016  ? plica removed  ? KNEE ARTHROSCOPY Right   ? 7 total  ? NASAL SEPTUM SURGERY    ? ORIF DISTAL RADIUS FRACTURE Right   ? age 72  ? REVISION TOTAL KNEE ARTHROPLASTY Right 08/19/2017  ? REVISION TOTAL SHOULDER TO REVERSE TOTAL SHOULDER Left 10/11/2021  ? Procedure: LEFT REVISION TOTAL SHOULDER TO REVERSE TOTAL SHOULDER, RIGHT SHOULDER STERIOD INJECTION;  Surgeon: Hiram Gash, MD;  Location: [facility];  Service: Orthopedics;  Laterality: Left;  ? SHOULDER ARTHROSCOPY W/ ROTATOR CUFF REPAIR Right 2011  ? bicep tendon and labrium repair  ? SHOULDER ARTHROSCOPY WITH ROTATOR CUFF REPAIR Left 2012  ? w/ labrium repair  ? TOTAL KNEE ARTHROPLASTY Right 10/12/2013  ? Procedure: RIGHT TOTAL KNEE ARTHROPLASTY;  Surgeon: Lorn Junes, MD;  Location: [facility];  Service: Orthopedics;  Laterality: Right;  ? VASECTOMY    ? ? ?There were no vitals filed for this visit. ? ? Subjective Assessment -  11/23/21 1609   ? ? Subjective numbness gone. reached and picked up tea today and I don't think I could do that 3 days ago   ? Currently in Pain? Yes   ? Pain Score 2    ? Pain Location Shoulder   ? Pain Orientation Left   ? ?  ?  ? ?  ? ? ? ? ? [facility] PT Assessment - 11/23/21 0001   ? ?  ? AROM  ? Left Shoulder Flexion 170 Degrees   supine  , standing 150  ? Left Shoulder ABduction 136 Degrees   SL  ? Left Shoulder External Rotation 38 Degrees   30 abd  ? ?  ?  ? ?  ? ? ? ? ? ? ? ? ? ? ? ? ? ? ? ? [facility] Adult PT Treatment/Exercise - 11/23/21 0001   ? ?  ? Shoulder Exercises: Supine  ? External Rotation Strengthening;Left;10 reps   3#  ? Flexion Strengthening;Left;10 reps   3#  ?  ? Shoulder Exercises: Sidelying  ? External Rotation Strengthening;Left;10 reps   3#  ? ABduction Strengthening;Left;10 reps   3#  ?  ? Shoulder Exercises: Standing  ? Flexion Strengthening;Left;20 reps  yellow tband  ? ABduction Strengthening;Left;20 reps;Theraband   ? Theraband Level (Shoulder ABduction) Level 1 (Yellow)   ? Other Standing Exercises body blade flex and abd 10 each   red tband 3 pt rhy stab 10 x BIL  ? Other Standing Exercises cable pulley ER 2 sets 10 AA, tricep ext 25# 2 sets 10. 5# bicep 2 sets 10   ?  ? Shoulder Exercises: ROM/Strengthening  ? UBE (Upper Arm Bike) L 4 3 min fwd/3 min back- within precautons   ?  ? Manual Therapy  ? Manual Therapy Passive ROM   ? Passive ROM flex,abd and ER   ? ?  ?  ? ?  ? ? ? ? ? ? ? ? ? ? ? ? PT Short Term Goals - 11/23/21 1654   ? ?  ? PT SHORT TERM GOAL #1  ? Title Will be compliant with appropriate HEP to be progressed as able per protocol   ? Status Achieved   ?  ? PT SHORT TERM GOAL #2  ? Title L shoulder AROM to be at least 160 degrees flexion and ABD, no more than 30 degrees ER AROM   ? Baseline flex and ER met in supine   ? Status Partially Met   ?  ? PT SHORT TERM GOAL #3  ? Title Will have better understanding of postural awareness and mechanics   ? Status Partially Met   ?   ? PT SHORT TERM GOAL #4  ? Title L shoulder strength to be at least 3 to 3+/5 in all tested groups   ? Baseline increasing as noted by tolerance to wt   ? Status Partially Met   ? ?  ?  ? ?  ? ? ? ? PT Long Term Goals - 11/09/21 0949   ? ?  ? PT LONG TERM GOAL #1  ? Title L shoulder MMT to be at least 4/5 in all muscle groups   ? Time 12   ? Period Weeks   ? Status New   ? Target Date 02/01/22   ?  ? PT LONG TERM GOAL #2  ? Title Will be able to lift a 10# weight overhead with good mechanics and without pain in L shoulder   ? Time 12   ? Period Weeks   ? Status New   ?  ? PT LONG TERM GOAL #3  ? 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 Time 12   ? Period Weeks   ? Status New   ?  ? PT LONG TERM GOAL #4  ? Title Will be able to perform L shoulder IR and extension to full range as appropriate within limits of protocol   ? Time 12   ? Period Weeks   ? Status New   ? ?  ?  ? ?  ? ? ? ? ? ? ? ? Plan - 11/23/21 1656   ? ? Clinical Impression Statement progressed ther ex within protocol, postural cuing needed to avoid compensatons. ROM increasing . goals met   ? PT Treatment/Interventions ADLs/Self Care Home Management;Cryotherapy;Electrical Stimulation;Iontophoresis 26m/ml Dexamethasone;Moist Heat;Ultrasound;Functional mobility training;Therapeutic activities;Therapeutic exercise;Patient/family education;Manual techniques;Passive range of motion;Dry needling;Energy conservation;Taping   ? PT Next Visit Plan follow MD protocol,   ? ?  ?  ? ?  ? ? ?Patient will benefit from skilled therapeutic  intervention in order to improve the following deficits and impairments:  Decreased range of motion, Increased fascial restricitons, Impaired UE functional use, Decreased knowledge of precautions, Pain, Hypomobility, Impaired flexibility, Improper body mechanics, Decreased strength, Increased edema, Postural  dysfunction ? ?Visit Diagnosis: ?Stiffness of left shoulder, not elsewhere classified ? ?Acute pain of left shoulder ? ?Abnormal posture ? ?Muscle weakness (generalized) ? ? ? ? ?Problem List ?Patient Active Problem List  ? Diagnosis Date Noted  ? BCC (basal cell carcinoma of skin) 06/29/2019  ? Chronic leukopenia 09/24/2018  ? PCP NOTES >>>>>>>>>>>>>> 09/24/2018  ? Anxiety 09/24/2018  ? Insomnia 09/24/2018  ? History of atrial fibrillation 09/24/2018  ? Irregular heart rhythm 02/24/2016  ? Dysfunction of both eustachian tubes 01/18/2016  ? DJD (degenerative joint disease) 10/12/2013  ? Pars defect of lumbar spine   ? ? ?Aydrien Froman,[name], PTA ?11/23/2021, 4:57 PM ? ?[facility] ?[place] ?[address] ?[place], [place], [address] ?Phone: [phone]   Fax:  [phone] ? ?Name: Bruce Cobb ?MRN: [id number] ?Date of Birth: Oct 23, 1965 ? ? ? ?

## 2021-11-28 ENCOUNTER — Other Ambulatory Visit: Payer: Self-pay

## 2021-11-28 ENCOUNTER — Ambulatory Visit: Payer: BC Managed Care – PPO | Admitting: Physical Therapy

## 2021-11-28 DIAGNOSIS — M25612 Stiffness of left shoulder, not elsewhere classified: Secondary | ICD-10-CM | POA: Diagnosis not present

## 2021-11-28 DIAGNOSIS — M25512 Pain in left shoulder: Secondary | ICD-10-CM

## 2021-11-28 DIAGNOSIS — M6281 Muscle weakness (generalized): Secondary | ICD-10-CM

## 2021-11-28 DIAGNOSIS — R293 Abnormal posture: Secondary | ICD-10-CM

## 2021-11-28 NOTE — Therapy (Signed)
[facility] ?[place] ?[address] ?[place], [place], [address] ?Phone: [phone]   Fax:  [phone] ? ?Physical Therapy Treatment ? ?Patient Details  ?Name: Bruce Cobb ?MRN: [id number] ?Date of Birth: 1965-11-10 ?Referring Provider (PT): Dax Griffin Basil ? ? ?Encounter Date: 11/28/2021 ? ? PT End of Session - 11/28/21 1317   ? ? Visit Number 6   ? Number of Visits 25   ? Date for PT Re-Evaluation 01/04/22   ? Authorization Type BCBS   ? Authorization Time Period 11/09/21 to 02/01/22   ? PT Start Time 1235   ? PT Stop Time 1315   ? PT Time Calculation (min) 40 min   ? ?  ?  ? ?  ? ? ?Past Medical History:  ?Diagnosis Date  ? BCC (basal cell carcinoma of skin)   ? Hematospermia   ? hx of, remotely   ? Paroxysmal atrial fibrillation ([place]) 09/2017  ? saw cards  ? Pars defect of lumbar spine   ? PONV (postoperative nausea and vomiting)   ? Right knee DJD   ? Shingles 2014  ? face  ? ? ?Past Surgical History:  ?Procedure Laterality Date  ? ANKLE FRACTURE SURGERY Left 1996  ? plated  ? KNEE ARTHROSCOPY Left 3976  ? plica removed  ? KNEE ARTHROSCOPY Right   ? 7 total  ? NASAL SEPTUM SURGERY    ? ORIF DISTAL RADIUS FRACTURE Right   ? age 54  ? REVISION TOTAL KNEE ARTHROPLASTY Right 08/19/2017  ? REVISION TOTAL SHOULDER TO REVERSE TOTAL SHOULDER Left 10/11/2021  ? Procedure: LEFT REVISION TOTAL SHOULDER TO REVERSE TOTAL SHOULDER, RIGHT SHOULDER STERIOD INJECTION;  Surgeon: Hiram Gash, MD;  Location: [facility];  Service: Orthopedics;  Laterality: Left;  ? SHOULDER ARTHROSCOPY W/ ROTATOR CUFF REPAIR Right 2011  ? bicep tendon and labrium repair  ? SHOULDER ARTHROSCOPY WITH ROTATOR CUFF REPAIR Left 2012  ? w/ labrium repair  ? TOTAL KNEE ARTHROPLASTY Right 10/12/2013  ? Procedure: RIGHT TOTAL KNEE ARTHROPLASTY;  Surgeon: Lorn Junes, MD;  Location: [facility];  Service: Orthopedics;  Laterality: Right;  ? VASECTOMY    ? ? ?There were no vitals filed for this visit. ? ? Subjective Assessment -  11/28/21 1241   ? ? Subjective definately using arm more, gravity is rough for sure   ? Currently in Pain? Yes   ? Pain Score 2    ? Pain Location Shoulder   ? Pain Orientation Left   ? ?  ?  ? ?  ? ? ? ? ? ? ? ? ? ? ? ? ? ? ? ? ? ? ? ? [facility] Adult PT Treatment/Exercise - 11/28/21 0001   ? ?  ? Shoulder Exercises: Supine  ? External Rotation Strengthening;Left;10 reps   5#  ? Flexion Strengthening;Left;10 reps   5#  ?  ? Shoulder Exercises: Sidelying  ? ABduction Strengthening;Left;10 reps   5#  ?  ? Shoulder Exercises: Standing  ? Other Standing Exercises 5# shruggs and backward rolls 2 sets 10   modified inclined wall push up 2 sets 10  ? Other Standing Exercises 3# ER 10 at side 2 sets 10 at 50degres abd   pulley tricpe 25# and bicep 5 # 2 sest 10. 5# cable pulleys flex 2 set s10  ?  ? Shoulder Exercises: ROM/Strengthening  ? UBE (Upper Arm Bike) L 4 3 min fwd/3 min back- within precautons   ?  ?  Manual Therapy  ? Manual Therapy Passive ROM   ? Passive ROM flex,abd and ER   ? ?  ?  ? ?  ? ? ? ? ? ? ? ? ? ? ? ? PT Short Term Goals - 11/23/21 1654   ? ?  ? PT SHORT TERM GOAL #1  ? Title Will be compliant with appropriate HEP to be progressed as able per protocol   ? Status Achieved   ?  ? PT SHORT TERM GOAL #2  ? Title L shoulder AROM to be at least 160 degrees flexion and ABD, no more than 30 degrees ER AROM   ? Baseline flex and ER met in supine   ? Status Partially Met   ?  ? PT SHORT TERM GOAL #3  ? Title Will have better understanding of postural awareness and mechanics   ? Status Partially Met   ?  ? PT SHORT TERM GOAL #4  ? Title L shoulder strength to be at least 3 to 3+/5 in all tested groups   ? Baseline increasing as noted by tolerance to wt   ? Status Partially Met   ? ?  ?  ? ?  ? ? ? ? PT Long Term Goals - 11/09/21 0949   ? ?  ? PT LONG TERM GOAL #1  ? Title L shoulder MMT to be at least 4/5 in all muscle groups   ? Time 12   ? Period Weeks   ? Status New   ? Target Date 02/01/22   ?  ? PT LONG TERM  GOAL #2  ? Title Will be able to lift a 10# weight overhead with good mechanics and without pain in L shoulder   ? Time 12   ? Period Weeks   ? Status New   ?  ? PT LONG TERM GOAL #3  ? 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 Time 12   ? Period Weeks   ? Status New   ?  ? PT LONG TERM GOAL #4  ? Title Will be able to perform L shoulder IR and extension to full range as appropriate within limits of protocol   ? Time 12   ? Period Weeks   ? Status New   ? ?  ?  ? ?  ? ? ? ? ? ? ? ? Plan - 11/28/21 1317   ? ? Clinical Impression Statement progressed strength and ROM within protocol limittaions. verb and tactile cuing   ? PT Treatment/Interventions ADLs/Self Care Home Management;Cryotherapy;Electrical Stimulation;Iontophoresis 5m/ml Dexamethasone;Moist Heat;Ultrasound;Functional mobility training;Therapeutic activities;Therapeutic exercise;Patient/family education;Manual techniques;Passive range of motion;Dry needling;Energy conservation;Taping   ? PT Next Visit Plan follow MD protocol,   ? ?  ?  ? ?  ? ? ?Patient will benefit from skilled therapeutic intervention in order to improve the following deficits and impairments:  Decreased range of motion, Increased fascial restricitons, Impaired UE functional use, Decreased knowledge of precautions, Pain, Hypomobility, Impaired flexibility, Improper body mechanics, Decreased strength, Increased edema, Postural dysfunction ? ?Visit Diagnosis: ?Stiffness of left shoulder, not elsewhere classified ? ?Acute pain of left shoulder ? ?Abnormal posture ? ?Muscle weakness (generalized) ? ? ? ? ?Problem List ?Patient Active Problem List  ? Diagnosis Date Noted  ? BCC (basal cell carcinoma of skin) 06/29/2019  ? Chronic leukopenia 09/24/2018  ? PCP NOTES >>>>>>>>>>>>>> 09/24/2018  ?  Anxiety 09/24/2018  ? Insomnia 09/24/2018  ? History of atrial fibrillation 09/24/2018   ? Irregular heart rhythm 02/24/2016  ? Dysfunction of both eustachian tubes 01/18/2016  ? DJD (degenerative joint disease) 10/12/2013  ? Pars defect of lumbar spine   ? ? ?Maxamillian Tienda,[name], PTA ?11/28/2021, 1:19 PM ? ?[facility] ?[place] [address] ?[place], [place], [address] ?Phone: [phone]   Fax:  [phone] ? ?Name: Bruce Cobb ?MRN: [id number] ?Date of Birth: 03-Mar-1966 ? ? ? ?

## 2021-11-30 ENCOUNTER — Ambulatory Visit: Payer: BC Managed Care – PPO | Admitting: Physical Therapy

## 2021-11-30 DIAGNOSIS — M25512 Pain in left shoulder: Secondary | ICD-10-CM

## 2021-11-30 DIAGNOSIS — R293 Abnormal posture: Secondary | ICD-10-CM

## 2021-11-30 DIAGNOSIS — M25612 Stiffness of left shoulder, not elsewhere classified: Secondary | ICD-10-CM

## 2021-11-30 DIAGNOSIS — M6281 Muscle weakness (generalized): Secondary | ICD-10-CM

## 2021-11-30 NOTE — Therapy (Signed)
[facility] ?[place] ?[address] ?[place], [place], [address] ?Phone: [phone]   Fax:  [phone] ? ?Physical Therapy Treatment ? ?Patient Details  ?Name: Bruce Cobb ?MRN: [id number] ?Date of Birth: 03/08/1966 ?Referring Provider (PT): Dax Griffin Basil ? ? ?Encounter Date: 11/30/2021 ? ? PT End of Session - 11/30/21 1048   ? ? Visit Number 7   ? Number of Visits 25   ? Date for PT Re-Evaluation 01/04/22   ? Authorization Type BCBS   ? Authorization Time Period 11/09/21 to 02/01/22   ? PT Start Time 5701   ? PT Stop Time 7793   ? PT Time Calculation (min) 55 min   ? ?  ?  ? ?  ? ? ?Past Medical History:  ?Diagnosis Date  ? BCC (basal cell carcinoma of skin)   ? Hematospermia   ? hx of, remotely   ? Paroxysmal atrial fibrillation ([facility]) 09/2017  ? saw cards  ? Pars defect of lumbar spine   ? PONV (postoperative nausea and vomiting)   ? Right knee DJD   ? Shingles 2014  ? face  ? ? ?Past Surgical History:  ?Procedure Laterality Date  ? ANKLE FRACTURE SURGERY Left 1996  ? plated  ? KNEE ARTHROSCOPY Left 9030  ? plica removed  ? KNEE ARTHROSCOPY Right   ? 7 total  ? NASAL SEPTUM SURGERY    ? ORIF DISTAL RADIUS FRACTURE Right   ? age 33  ? REVISION TOTAL KNEE ARTHROPLASTY Right 08/19/2017  ? REVISION TOTAL SHOULDER TO REVERSE TOTAL SHOULDER Left 10/11/2021  ? Procedure: LEFT REVISION TOTAL SHOULDER TO REVERSE TOTAL SHOULDER, RIGHT SHOULDER STERIOD INJECTION;  Surgeon: Hiram Gash, MD;  Location: [facility];  Service: Orthopedics;  Laterality: Left;  ? SHOULDER ARTHROSCOPY W/ ROTATOR CUFF REPAIR Right 2011  ? bicep tendon and labrium repair  ? SHOULDER ARTHROSCOPY WITH ROTATOR CUFF REPAIR Left 2012  ? w/ labrium repair  ? TOTAL KNEE ARTHROPLASTY Right 10/12/2013  ? Procedure: RIGHT TOTAL KNEE ARTHROPLASTY;  Surgeon: Lorn Junes, MD;  Location: [facility];  Service: Orthopedics;  Laterality: Right;  ? VASECTOMY    ? ? ?There were no vitals filed for this visit. ? ? Subjective Assessment -  11/30/21 0943   ? ? Subjective mornings are awful ,sorry I am late. gravity is just pulling on arm   ? Currently in Pain? Yes   ? Pain Score 5    ? Pain Location Shoulder   ? Pain Orientation Left   ? ?  ?  ? ?  ? ? ? ? ? [facility] PT Assessment - 11/30/21 0001   ? ?  ? AROM  ? Left Shoulder Flexion 170 Degrees   supin above, standing 150  ? Left Shoulder ABduction 140 Degrees   standing 140  ? Left Shoulder External Rotation 58 Degrees   60 degree abd  ? ?  ?  ? ?  ? ? ? ? ? ? ? ? ? ? ? ? ? ? ? ? [facility] Adult PT Treatment/Exercise - 11/30/21 0001   ? ?  ? Shoulder Exercises: Supine  ? External Rotation Strengthening;Left;15 reps   90 degree abd 4#  ? Flexion Strengthening;Left;15 reps   4#  ?  ? Shoulder Exercises: Sidelying  ? External Rotation Strengthening;Left;15 reps   4#  ?  ? Shoulder Exercises: Standing  ? Flexion Strengthening;Left;20 reps   red tband  ? ABduction Strengthening;Left;20 reps;Theraband   ?  Theraband Level (Shoulder ABduction) Level 2 (Red)   ? Other Standing Exercises red tband scap 3 way 10 each   ? Other Standing Exercises 3# PNF and empty cane 10 each   5# shruggs and backward rolls 12 each  ?  ? Shoulder Exercises: ROM/Strengthening  ? UBE (Upper Arm Bike) L 4 3 min fwd/3 min back- within precautons   ?  ? Manual Therapy  ? Manual Therapy Passive ROM;Taping;Joint mobilization   ? Joint Mobilization joint capsule stretching   scap mobs with cupping  ? Passive ROM flex,abd and ER   ? Kinesiotex Inhibit Muscle;Facilitate Muscle   inhibit over levator and teres, facilate supportshld joint  ? ?  ?  ? ?  ? ? ? ? ? ? ? ? ? ? ? ? PT Short Term Goals - 11/30/21 1048   ? ?  ? PT SHORT TERM GOAL #2  ? Title L shoulder AROM to be at least 160 degrees flexion and ABD, no more than 30 degrees ER AROM   ? Status Achieved   ?  ? PT SHORT TERM GOAL #3  ? Title Will have better understanding of postural awareness and mechanics   ? Status Achieved   ?  ? PT SHORT TERM GOAL #4  ? Title L shoulder strength to be at  least 3 to 3+/5 in all tested groups   ? Baseline increasing as noted by tolerance to wt   ? Status Partially Met   ? ?  ?  ? ?  ? ? ? ? PT Long Term Goals - 11/09/21 0949   ? ?  ? PT LONG TERM GOAL #1  ? Title L shoulder MMT to be at least 4/5 in all muscle groups   ? Time 12   ? Period Weeks   ? Status New   ? Target Date 02/01/22   ?  ? PT LONG TERM GOAL #2  ? Title Will be able to lift a 10# weight overhead with good mechanics and without pain in L shoulder   ? Time 12   ? Period Weeks   ? Status New   ?  ? PT LONG TERM GOAL #3  ? 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 Time 12   ? Period Weeks   ? Status New   ?  ? PT LONG TERM GOAL #4  ? Title Will be able to perform L shoulder IR and extension to full range as appropriate within limits of protocol   ? Time 12   ? Period Weeks   ? Status New   ? ?  ?  ? ?  ? ? ? ? ? ? ? ? Plan - 11/30/21 1049   ? ? Clinical Impression Statement STGs progressing . pt had early morning appt today increased aches and pains. added cupping and KT tape. progressed ex and ROM is improving cuing needed with ex to prevent compensaions   ? PT Treatment/Interventions ADLs/Self Care Home Management;Cryotherapy;Electrical Stimulation;Iontophoresis 4mg /ml Dexamethasone;Moist Heat;Ultrasound;Functional mobility training;Therapeutic activities;Therapeutic exercise;Patient/family education;Manual techniques;Passive range of motion;Dry needling;Energy conservation;Taping   ? PT Next Visit Plan follow MD protocol,   ? ?  ?  ? ?  ? ? ?Patient will benefit from skilled therapeutic intervention in order to improve the following deficits and impairments:  Decreased range of motion, Increased fascial restricitons, Impaired UE functional  use, Decreased knowledge of precautions, Pain, Hypomobility, Impaired flexibility, Improper body mechanics, Decreased strength, Increased edema,  Postural dysfunction ? ?Visit Diagnosis: ?Stiffness of left shoulder, not elsewhere classified ? ?Acute pain of left shoulder ? ?Abnormal posture ? ?Muscle weakness (generalized) ? ? ? ? ?Problem List ?Patient Active Problem List  ? Diagnosis Date Noted  ? BCC (basal cell carcinoma of skin) 06/29/2019  ? Chronic leukopenia 09/24/2018  ? PCP NOTES >>>>>>>>>>>>>> 09/24/2018  ? Anxiety 09/24/2018  ? Insomnia 09/24/2018  ? History of atrial fibrillation 09/24/2018  ? Irregular heart rhythm 02/24/2016  ? Dysfunction of both eustachian tubes 01/18/2016  ? DJD (degenerative joint disease) 10/12/2013  ? Pars defect of lumbar spine   ? ? ?Kadesha Virrueta,[name], PTA ?11/30/2021, 10:56 AM ? ?[facility] ?[address] ?[address] ?[place], [place], [address] ?Phone: [phone]   Fax:  [phone] ? ?Name: Bruce Cobb ?MRN: [id number] ?Date of Birth: 1965-10-09 ? ? ? ?

## 2021-12-05 ENCOUNTER — Encounter: Payer: Self-pay | Admitting: Physical Therapy

## 2021-12-05 ENCOUNTER — Ambulatory Visit: Payer: BC Managed Care – PPO | Attending: Orthopaedic Surgery | Admitting: Physical Therapy

## 2021-12-05 DIAGNOSIS — M25512 Pain in left shoulder: Secondary | ICD-10-CM | POA: Diagnosis present

## 2021-12-05 DIAGNOSIS — M6281 Muscle weakness (generalized): Secondary | ICD-10-CM

## 2021-12-05 DIAGNOSIS — M25612 Stiffness of left shoulder, not elsewhere classified: Secondary | ICD-10-CM | POA: Diagnosis present

## 2021-12-05 DIAGNOSIS — R293 Abnormal posture: Secondary | ICD-10-CM | POA: Diagnosis present

## 2021-12-05 NOTE — Therapy (Signed)
?[facility] ?[address] ?[place], [place], [address] ?Phone: [phone]   Fax:  [phone] ? ?Physical Therapy Treatment ? ?Patient Details  ?Name: Bruce Cobb ?MRN: [id number] ?Date of Birth: March 18, 1966 ?Referring Provider (PT): Dax Griffin Basil ? ? ?Encounter Date: 12/05/2021 ? ? PT End of Session - 12/05/21 1707   ? ? Visit Number 8   ? Number of Visits 25   ? Date for PT Re-Evaluation 01/04/22   ? Authorization Type BCBS   ? Authorization Time Period 11/09/21 to 02/01/22   ? Authorization - Number of Visits 30   ? PT Start Time 9741   ? PT Stop Time 6384   ? PT Time Calculation (min) 41 min   ? Activity Tolerance Patient tolerated treatment well   ? Behavior During Therapy [facility] for tasks assessed/performed   ? ?  ?  ? ?  ? ? ?Past Medical History:  ?Diagnosis Date  ? BCC (basal cell carcinoma of skin)   ? Hematospermia   ? hx of, remotely   ? Paroxysmal atrial fibrillation ([place]) 09/2017  ? saw cards  ? Pars defect of lumbar spine   ? PONV (postoperative nausea and vomiting)   ? Right knee DJD   ? Shingles 2014  ? face  ? ? ?Past Surgical History:  ?Procedure Laterality Date  ? ANKLE FRACTURE SURGERY Left 1996  ? plated  ? KNEE ARTHROSCOPY Left 5364  ? plica removed  ? KNEE ARTHROSCOPY Right   ? 7 total  ? NASAL SEPTUM SURGERY    ? ORIF DISTAL RADIUS FRACTURE Right   ? age 42  ? REVISION TOTAL KNEE ARTHROPLASTY Right 08/19/2017  ? REVISION TOTAL SHOULDER TO REVERSE TOTAL SHOULDER Left 10/11/2021  ? Procedure: LEFT REVISION TOTAL SHOULDER TO REVERSE TOTAL SHOULDER, RIGHT SHOULDER STERIOD INJECTION;  Surgeon: Hiram Gash, MD;  Location: [facility];  Service: Orthopedics;  Laterality: Left;  ? SHOULDER ARTHROSCOPY W/ ROTATOR CUFF REPAIR Right 2011  ? bicep tendon and labrium repair  ? SHOULDER ARTHROSCOPY WITH ROTATOR CUFF REPAIR Left 2012  ? w/ labrium repair  ? TOTAL KNEE ARTHROPLASTY Right 10/12/2013  ? Procedure: RIGHT TOTAL KNEE ARTHROPLASTY;  Surgeon: Lorn Junes, MD;   Location: [facility];  Service: Orthopedics;  Laterality: Right;  ? VASECTOMY    ? ? ?There were no vitals filed for this visit. ? ? Subjective Assessment - 12/05/21 1619   ? ? Subjective My shoulder is feeling off today, I picked up a can of paint used the left arm to help throw it into a dumpster the other day and I wonder if I didn't use that arm too much.   ? 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 Currently in Pain? Yes   ? Pain Score 4    ? Pain Location Shoulder   ? Pain Orientation Left   ? Pain Descriptors / Indicators Throbbing   ? Pain Type Chronic pain   ? ?  ?  ? ?  ? ? ? ? ? ? ? ? ? ? ? ? ? ? ? ? ? ? ? ? [facility] Adult PT Treatment/Exercise - 12/05/21 0001   ? ?  ? Shoulder Exercises: Seated  ? Other Seated Exercises UBE L3 x4 minutes, unable to tolerate full 6 minutes due to shoulder pain   ?  ? Shoulder Exercises: Standing  ? Flexion  Strengthening;20 reps;Right;Theraband   ? Flexion Limitations red TB   ? ABduction Strengthening;Right;20 reps;Theraband   ? Theraband Level (Shoulder ABduction) Level 2 (Red)   ?  ? Shoulder Exercises: Stretch  ? Other Shoulder Stretches flexion stretch x120 seconds on pullup bars   ?  ? Manual Therapy  ? Manual Therapy Soft tissue mobilization   ? Soft tissue mobilization extnsive soft tissue work anterior/middle/posterior delts, teres major and minor, biceps, supraspinatus   ? ?  ?  ? ?  ? ? ? ? ? ? ? ? ? ? PT Education - 12/05/21 1706   ? ? Education Details potential POC- maybe go to 1x/week soon so we have more sessions for aggressive strengthening at/after 12 weeks?   ? Person(s) Educated Patient   ? Methods Explanation   ? Comprehension Verbalized understanding   ? ?  ?  ? ?  ? ? ? PT Short Term Goals - 11/30/21 1048   ? ?  ? PT SHORT TERM GOAL #2  ? Title L shoulder AROM to be at least 160 degrees flexion and ABD, no more than 30 degrees ER AROM   ? Status Achieved   ?  ? PT SHORT TERM GOAL #3  ? Title Will have  better understanding of postural awareness and mechanics   ? Status Achieved   ?  ? PT SHORT TERM GOAL #4  ? Title L shoulder strength to be at least 3 to 3+/5 in all tested groups   ? Baseline increasing as noted by tolerance to wt   ? Status Partially Met   ? ?  ?  ? ?  ? ? ? ? PT Long Term Goals - 11/09/21 0949   ? ?  ? PT LONG TERM GOAL #1  ? Title L shoulder MMT to be at least 4/5 in all muscle groups   ? Time 12   ? Period Weeks   ? Status New   ? Target Date 02/01/22   ?  ? PT LONG TERM GOAL #2  ? Title Will be able to lift a 10# weight overhead with good mechanics and without pain in L shoulder   ? Time 12   ? Period Weeks   ? Status New   ?  ? PT LONG TERM GOAL #3  ? 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 Time 12   ? Period Weeks   ? Status New   ?  ? PT LONG TERM GOAL #4  ? Title Will be able to perform L shoulder IR and extension to full range as appropriate within limits of protocol   ? Time 12   ? Period Weeks   ? Status New   ? ?  ?  ? ?  ? ? ? ? ? ? ? ? Plan - 12/05/21 1707   ? ? Clinical Impression Statement Gillis arrives today doing OK, tweaked his shoulder a bit by accident over the weekend. Tried warming up on UBE however had multiple painful spasms and we had to stop, I found multiple chains of trigger points in supraspinatus, all areas of deltoid, biceps, and teres groups in rotator cuff. Spent a large part of session releasing these trigger points with good results, also on scar massage. Hopefully his shoulder will be feeling better and we will be able to progress to regular activities next  session.   ? Personal Factors and Comorbidities Past/Current Experience;Social Background;Time since onset of injury/illness/exacerbation   ? Examination-Activity Limitations Bathing;Bed Mobility;Reach Overhead;Caring for Others;Carry;Dressing;Hygiene/Grooming;Lift   ?  Examination-Participation Restrictions Occupation;Cleaning;Community Activity;Driving;Shop;Yard Work   ? Stability/Clinical Decision Making Stable/Uncomplicated   ? Clinical Decision Making Low   ? Rehab Potential Good   ? PT Frequency 2x / week   ? PT Duration 12 weeks   ? PT Treatment/Interventions ADLs/Self Care Home Management;Cryotherapy;Electrical Stimulation;Iontophoresis 4mg /ml Dexamethasone;Moist Heat;Ultrasound;Functional mobility training;Therapeutic activities;Therapeutic exercise;Patient/family education;Manual techniques;Passive range of motion;Dry needling;Energy conservation;Taping   ? PT Next Visit Plan follow MD protocol, tape again   ? PT [place]   ? Consulted and Agree with Plan of Care Patient   ? ?  ?  ? ?  ? ? ?Patient will benefit from skilled therapeutic intervention in order to improve the following deficits and impairments:  Decreased range of motion, Increased fascial restricitons, Impaired UE functional use, Decreased knowledge of precautions, Pain, Hypomobility, Impaired flexibility, Improper body mechanics, Decreased strength, Increased edema, Postural dysfunction ? ?Visit Diagnosis: ?Stiffness of left shoulder, not elsewhere classified ? ?Abnormal posture ? ?Muscle weakness (generalized) ? ?Acute pain of left shoulder ? ? ? ? ?Problem List ?Patient Active Problem List  ? Diagnosis Date Noted  ? BCC (basal cell carcinoma of skin) 06/29/2019  ? Chronic leukopenia 09/24/2018  ? PCP NOTES >>>>>>>>>>>>>> 09/24/2018  ? Anxiety 09/24/2018  ? Insomnia 09/24/2018  ? History of atrial fibrillation 09/24/2018  ? Irregular heart rhythm 02/24/2016  ? Dysfunction of both eustachian tubes 01/18/2016  ? DJD (degenerative joint disease) 10/12/2013  ? Pars defect of lumbar spine   ? ?Venson Ferencz U PT, DPT, PN2  ? ?Supplemental Physical Therapist ?[facility]  ? ? ? ? ?[facility] ?[address] ?[place], [place], [address] ?Phone:  [phone]   Fax:  [phone] ? ?Name: Bruce Cobb ?MRN: [id number] ?Date of Birth: November 12, 1965 ? ? ? ?

## 2021-12-07 ENCOUNTER — Ambulatory Visit: Payer: BC Managed Care – PPO | Admitting: Physical Therapy

## 2021-12-07 DIAGNOSIS — R293 Abnormal posture: Secondary | ICD-10-CM

## 2021-12-07 DIAGNOSIS — M6281 Muscle weakness (generalized): Secondary | ICD-10-CM

## 2021-12-07 DIAGNOSIS — M25612 Stiffness of left shoulder, not elsewhere classified: Secondary | ICD-10-CM | POA: Diagnosis not present

## 2021-12-07 NOTE — Therapy (Signed)
[facility] ?[facility] ?[address] ?[place], [place], [address] ?Phone: [phone]   Fax:  [phone] ? ?Physical Therapy Treatment ? ?Patient Details  ?Name: Bruce Cobb ?MRN: [id number] ?Date of Birth: 1966-08-26 ?Referring Provider (PT): Dax Griffin Basil ? ? ?Encounter Date: 12/07/2021 ? ? PT End of Session - 12/07/21 1700   ? ? Visit Number 9   ? Number of Visits 25   ? Date for PT Re-Evaluation 01/04/22   ? Authorization Type BCBS   ? Authorization Time Period 11/09/21 to 02/01/22   ? PT Start Time 1615   ? PT Stop Time 1700   ? PT Time Calculation (min) 45 min   ? ?  ?  ? ?  ? ? ?Past Medical History:  ?Diagnosis Date  ? BCC (basal cell carcinoma of skin)   ? Hematospermia   ? hx of, remotely   ? Paroxysmal atrial fibrillation ([place]) 09/2017  ? saw cards  ? Pars defect of lumbar spine   ? PONV (postoperative nausea and vomiting)   ? Right knee DJD   ? Shingles 2014  ? face  ? ? ?Past Surgical History:  ?Procedure Laterality Date  ? ANKLE FRACTURE SURGERY Left 1996  ? plated  ? KNEE ARTHROSCOPY Left 2297  ? plica removed  ? KNEE ARTHROSCOPY Right   ? 7 total  ? NASAL SEPTUM SURGERY    ? ORIF DISTAL RADIUS FRACTURE Right   ? age 56  ? REVISION TOTAL KNEE ARTHROPLASTY Right 08/19/2017  ? REVISION TOTAL SHOULDER TO REVERSE TOTAL SHOULDER Left 10/11/2021  ? Procedure: LEFT REVISION TOTAL SHOULDER TO REVERSE TOTAL SHOULDER, RIGHT SHOULDER STERIOD INJECTION;  Surgeon: Hiram Gash, MD;  [facility];  Service: Orthopedics;  Laterality: Left;  ? SHOULDER ARTHROSCOPY W/ ROTATOR CUFF REPAIR Right 2011  ? bicep tendon and labrium repair  ? SHOULDER ARTHROSCOPY WITH ROTATOR CUFF REPAIR Left 2012  ? w/ labrium repair  ? TOTAL KNEE ARTHROPLASTY Right 10/12/2013  ? Procedure: RIGHT TOTAL KNEE ARTHROPLASTY;  Surgeon: Lorn Junes, MD;  Location: [facility];  Service: Orthopedics;  Laterality: Right;  ? VASECTOMY    ? ? ?There were no vitals filed for this visit. ? ? Subjective Assessment -  12/07/21 1610   ? ? Subjective feeling better, STW helped and tape is great   ? Currently in Pain? Yes   ? Pain Score 1    ? Pain Location Shoulder   ? Pain Orientation Left   ? ?  ?  ? ?  ? ? ? ? ? [facility] PT Assessment - 12/07/21 0001   ? ?  ? AROM  ? Left Shoulder Flexion 170 Degrees   standing 160  ? Left Shoulder ABduction 145 Degrees   ? Left Shoulder External Rotation 65 Degrees   ?  ? PROM  ? Left Shoulder External Rotation 72 Degrees   ? ?  ?  ? ?  ? ? ? ? ? ? ? ? ? ? ? ? ? ? ? ? [facility] Adult PT Treatment/Exercise - 12/07/21 0001   ? ?  ? Shoulder Exercises: Supine  ? Other Supine Exercises left shld flex,chest press, ER, and abd tband and 3-4 #   ?  ? Shoulder Exercises: Sidelying  ? Other Sidelying Exercises abd and ER tband and 3-4 #   ?  ? Shoulder Exercises: Standing  ? Other Standing Exercises chest press, flex , PNF and abd 4#   ?  ?  Shoulder Exercises: ROM/Strengthening  ? UBE (Upper Arm Bike) L 4 3 min fwd/3 min back- within precautons   ?  ? Manual Therapy  ? Manual Therapy Soft tissue mobilization;Passive ROM;Joint mobilization   ? Joint Mobilization left shld   ? Soft tissue mobilization anterior/middle/posterior delts, teres major and minor, biceps, supraspinatus   ? Passive ROM flex,abd and ER   ? Kinesiotex Inhibit Muscle   ? ?  ?  ? ?  ? ? ? ? ? ? ? ? ? ? ? ? PT Short Term Goals - 12/07/21 1659   ? ?  ? PT SHORT TERM GOAL #2  ? Title L shoulder AROM to be at least 160 degrees flexion and ABD, no more than 30 degrees ER AROM   ? Status Partially Met   ? ?  ?  ? ?  ? ? ? ? PT Long Term Goals - 12/07/21 1658   ? ?  ? PT LONG TERM GOAL #1  ? Title L shoulder MMT to be at least 4/5 in all muscle groups   ? Status On-going   ?  ? PT LONG TERM GOAL #2  ? Title Will be able to lift a 10# weight overhead with good mechanics and without pain in L shoulder   ? Status On-going   ?  ? PT LONG TERM GOAL #3  ? Title Will be able to perform appropriate functional tasks at home and at the farm within limits of  protocol/MD guidance without increase in shoulder pain   ? Status On-going   ?  ? PT LONG TERM GOAL #4  ? Title Will be able to perform L shoulder IR and extension to full range as appropriate within limits of protocol   ? Status On-going   ? ?  ?  ? ?  ? ? ? ? ? ? ? ? Plan - 12/07/21 1700   ? ? Clinical Impression Statement pt arrived today doing well and feeling better. aggressive PROM within limited and progressed strength with cuing ( verb and tactie) for compensations. discussed wife coming in for PROM and stretching and decrease freq 1 x a week d/t insurane liits   ? PT Treatment/Interventions ADLs/Self Care Home Management;Cryotherapy;Electrical Stimulation;Iontophoresis 37m/ml Dexamethasone;Moist Heat;Ultrasound;Functional mobility training;Therapeutic activities;Therapeutic exercise;Patient/family education;Manual techniques;Passive range of motion;Dry needling;Energy conservation;Taping   ? PT Next Visit Plan follow MD protocol, educ wife on PROM   ? ?  ?  ? ?  ? ? ?Patient will benefit from skilled therapeutic intervention in order to improve the following deficits and impairments:  Decreased range of motion, Increased fascial restricitons, Impaired UE functional use, Decreased knowledge of precautions, Pain, Hypomobility, Impaired flexibility, Improper body mechanics, Decreased strength, Increased edema, Postural dysfunction ? ?Visit Diagnosis: ?Stiffness of left shoulder, not elsewhere classified ? ?Abnormal posture ? ?Muscle weakness (generalized) ? ? ? ? ?Problem List ?Patient Active Problem List  ? Diagnosis Date Noted  ? BCC (basal cell carcinoma of skin) 06/29/2019  ? Chronic leukopenia 09/24/2018  ? PCP NOTES >>>>>>>>>>>>>> 09/24/2018  ? Anxiety 09/24/2018  ? Insomnia 09/24/2018  ? History of atrial fibrillation 09/24/2018  ? Irregular heart rhythm 02/24/2016  ? Dysfunction of both eustachian tubes 01/18/2016  ? DJD (degenerative joint disease) 10/12/2013  ? Pars defect of lumbar spine    ? ? ?Daiveon Markman,[name], PTA ?12/07/2021, 5:03 PM ? ?[facility] ?[address] ?[place] [address]?Phone: [phone]  Fax:  [phone]? ?Name:  Bruce Cobb ?MRN: [id number] ?Date of Birth: 1965/09/18 ? ? ? ?

## 2021-12-08 ENCOUNTER — Ambulatory Visit: Payer: BC Managed Care – PPO | Admitting: Physical Therapy

## 2021-12-12 ENCOUNTER — Encounter: Payer: Self-pay | Admitting: Physical Therapy

## 2021-12-12 ENCOUNTER — Ambulatory Visit: Payer: BC Managed Care – PPO | Admitting: Physical Therapy

## 2021-12-12 DIAGNOSIS — M25512 Pain in left shoulder: Secondary | ICD-10-CM

## 2021-12-12 DIAGNOSIS — M25612 Stiffness of left shoulder, not elsewhere classified: Secondary | ICD-10-CM

## 2021-12-12 DIAGNOSIS — R293 Abnormal posture: Secondary | ICD-10-CM

## 2021-12-12 DIAGNOSIS — M6281 Muscle weakness (generalized): Secondary | ICD-10-CM

## 2021-12-12 NOTE — Therapy (Signed)
[facility] ?[address] ?[address] ?[place], [place], [address] ?Phone: [phone]   Fax:  [phone] ? ?Physical Therapy Treatment ? ?Patient Details  ?Name: Bruce Cobb ?MRN: [id number] ?Date of Birth: 1966/02/03 ?Referring Provider (PT): Dax Griffin Basil ? ? ?Encounter Date: 12/12/2021 ? ? PT End of Session - 12/12/21 1445   ? ? Visit Number 10   ? Number of Visits 25   ? Date for PT Re-Evaluation 01/04/22   ? Authorization Type BCBS   ? Authorization Time Period 11/09/21 to 02/01/22   ? PT Start Time 1355   ? PT Stop Time 0277   ? PT Time Calculation (min) 50 min   ? ?  ?  ? ?  ? ? ?Past Medical History:  ?Diagnosis Date  ? BCC (basal cell carcinoma of skin)   ? Hematospermia   ? hx of, remotely   ? Paroxysmal atrial fibrillation ([place]) 09/2017  ? saw cards  ? Pars defect of lumbar spine   ? PONV (postoperative nausea and vomiting)   ? Right knee DJD   ? Shingles 2014  ? face  ? ? ?Past Surgical History:  ?Procedure Laterality Date  ? ANKLE FRACTURE SURGERY Left 1996  ? plated  ? KNEE ARTHROSCOPY Left 4128  ? plica removed  ? KNEE ARTHROSCOPY Right   ? 7 total  ? NASAL SEPTUM SURGERY    ? ORIF DISTAL RADIUS FRACTURE Right   ? age 1  ? REVISION TOTAL KNEE ARTHROPLASTY Right 08/19/2017  ? REVISION TOTAL SHOULDER TO REVERSE TOTAL SHOULDER Left 10/11/2021  ? Procedure: LEFT REVISION TOTAL SHOULDER TO REVERSE TOTAL SHOULDER, RIGHT SHOULDER STERIOD INJECTION;  Surgeon: Hiram Gash, MD;  [facility];  Service: Orthopedics;  Laterality: Left;  ? SHOULDER ARTHROSCOPY W/ ROTATOR CUFF REPAIR Right 2011  ? bicep tendon and labrium repair  ? SHOULDER ARTHROSCOPY WITH ROTATOR CUFF REPAIR Left 2012  ? w/ labrium repair  ? TOTAL KNEE ARTHROPLASTY Right 10/12/2013  ? Procedure: RIGHT TOTAL KNEE ARTHROPLASTY;  Surgeon: Lorn Junes, MD;  Location: [facility];  Service: Orthopedics;  Laterality: Right;  ? VASECTOMY    ? ? ?There were no vitals filed for this visit. ? ? Subjective Assessment -  12/12/21 1403   ? ? Subjective doing okay, RT shld needs another injection   ? Currently in Pain? Yes   ? Pain Score 2    ? Pain Location Shoulder   ? Pain Orientation Left   ? ?  ?  ? ?  ? ? ? ? ? ? ? ? ? ? ? ? ? ? ? ? ? ? ? ? [facility] Adult PT Treatment/Exercise - 12/12/21 0001   ? ?  ? Shoulder Exercises: Supine  ? Other Supine Exercises left shoulder chest press 10 reps, chest press w/ flexion 10 reps   5#  ?  ? Shoulder Exercises: Sidelying  ? Other Sidelying Exercises abd #5   ?  ? Shoulder Exercises: Standing  ? Horizontal ABduction AROM;Strengthening;Both;20 reps   red TB  ? External Rotation AROM;Strengthening;Left;Theraband;20 reps   red TB  ? Flexion AROM;Left;Theraband;20 reps   red TB  ? ABduction AROM;Strengthening;Left;Theraband;20 reps   red TB  ? Diagonals AROM;Strengthening;Both;20 reps   up/down diagnals/ red TB  ? Other Standing Exercises D1 flexion 20 reps red TB   ?  ? Shoulder Exercises: ROM/Strengthening  ? UBE (Upper Arm Bike) L 4 3 min fwd/3 min back- within precautons   ?  Other ROM/Strengthening Exercises throwing/catching L UE w/ green weight ball 10 reps, external rotation focused throw w/ green weight ball 10 reps.   ? Other ROM/Strengthening Exercises sideways throwing/catching w/ green weight ball 10 reps,   ?  ? Shoulder Exercises: Stretch  ? External Rotation Stretch 10 seconds;5 reps   2 sets  ? Table Stretch - Flexion 5 reps;10 seconds   2 sets  ? Table Stretch - Abduction 5 reps;10 seconds   2 sets  ?  ? Manual Therapy  ? Manual Therapy Passive ROM;Taping   ? Passive ROM flex,abd and ER   educ wife on PROM  ? Kinesiotex Inhibit Muscle   educ wife on KT tape  ? ?  ?  ? ?  ? ? ? ? ? ? ? ? ? ? PT Education - 12/12/21 1445   ? ? Education Details edcu wife of PROM flex,ext and ER and KT tape for support   ? Person(s) Educated Patient;Spouse   ? Methods Explanation;Demonstration   ? Comprehension Verbalized understanding;Returned demonstration   ? ?  ?  ? ?  ? ? ? PT Short Term Goals -  12/07/21 1659   ? ?  ? PT SHORT TERM GOAL #2  ? Title L shoulder AROM to be at least 160 degrees flexion and ABD, no more than 30 degrees ER AROM   ? Status Partially Met   ? ?  ?  ? ?  ? ? ? ? PT Long Term Goals - 12/07/21 1658   ? ?  ? PT LONG TERM GOAL #1  ? Title L shoulder MMT to be at least 4/5 in all muscle groups   ? Status On-going   ?  ? PT LONG TERM GOAL #2  ? Title Will be able to lift a 10# weight overhead with good mechanics and without pain in L shoulder   ? Status On-going   ?  ? PT LONG TERM GOAL #3  ? 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 Status On-going   ?  ? PT LONG TERM GOAL #4  ? Title Will be able to perform L shoulder IR and extension to full range as appropriate within limits of protocol   ? Status On-going   ? ?  ?  ? ?  ? ? ? ? ? ? ? ? Plan - 12/12/21 1446   ? ? Clinical Impression Statement progressed strengtheninga nd ROM ex within protocol limittations with cuing. educ wife on PROM/stretching and KT tape. Pt will decrease to 1 x a week unril 12 weeks and precautions lifted   ? PT Treatment/Interventions ADLs/Self Care Home Management;Cryotherapy;Electrical Stimulation;Iontophoresis 8m/ml Dexamethasone;Moist Heat;Ultrasound;Functional mobility training;Therapeutic activities;Therapeutic exercise;Patient/family education;Manual techniques;Passive range of motion;Dry needling;Energy conservation;Taping   ? PT Next Visit Plan follow MD protocol,1 x a week for 3 weeks until 12 weeks   ? ?  ?  ? ?  ? ? ?Patient will benefit from skilled therapeutic intervention in order to improve the following deficits and impairments:  Decreased range of motion, Increased fascial restricitons, Impaired UE functional use, Decreased knowledge of precautions, Pain, Hypomobility, Impaired flexibility, Improper body mechanics, Decreased strength, Increased edema, Postural  dysfunction ? ?Visit Diagnosis: ?Stiffness of left shoulder, not elsewhere classified ? ?Abnormal posture ? ?Muscle weakness (generalized) ? ?Acute pain of left shoulder ? ? ? ? ?Problem List ?Patient Active Problem  List  ? Diagnosis Date Noted  ? BCC (basal cell carcinoma of skin) 06/29/2019  ? Chronic leukopenia 09/24/2018  ? PCP NOTES >>>>>>>>>>>>>> 09/24/2018  ? Anxiety 09/24/2018  ? Insomnia 09/24/2018  ? History of atrial fibrillation 09/24/2018  ? Irregular heart rhythm 02/24/2016  ? Dysfunction of both eustachian tubes 01/18/2016  ? DJD (degenerative joint disease) 10/12/2013  ? Pars defect of lumbar spine   ? ? ?Archibald Marchetta,[name], PTA ?12/12/2021, 2:48 PM ? ?[facility] ?[address] ?[place], [place], [address] ?Phone: [phone]   Fax:  [phone] ? ?Name: Bruce Cobb ?MRN: [id number] ?Date of Birth: 05/04/1966 ? ? ? ?

## 2021-12-14 ENCOUNTER — Ambulatory Visit: Payer: BC Managed Care – PPO | Admitting: Physical Therapy

## 2021-12-19 ENCOUNTER — Ambulatory Visit: Payer: BC Managed Care – PPO | Admitting: Physical Therapy

## 2021-12-19 DIAGNOSIS — M25612 Stiffness of left shoulder, not elsewhere classified: Secondary | ICD-10-CM | POA: Diagnosis not present

## 2021-12-19 DIAGNOSIS — R293 Abnormal posture: Secondary | ICD-10-CM

## 2021-12-19 DIAGNOSIS — M6281 Muscle weakness (generalized): Secondary | ICD-10-CM

## 2021-12-19 DIAGNOSIS — M25512 Pain in left shoulder: Secondary | ICD-10-CM

## 2021-12-19 NOTE — Therapy (Signed)
[facility] ?[place] ?[address] ?[place], [place], [address] ?Phone: [phone]   Fax:  [phone] ? ?Physical Therapy Treatment ? ?Patient Details  ?Name: Bruce Cobb ?MRN: [id number] ?Date of Birth: 11-12-1965 ?Referring Provider (PT): Dax Griffin Basil ? ? ?Encounter Date: 12/19/2021 ? ? ? ?Past Medical History:  ?Diagnosis Date  ? BCC (basal cell carcinoma of skin)   ? Hematospermia   ? hx of, remotely   ? Paroxysmal atrial fibrillation ([facility]) 09/2017  ? saw cards  ? Pars defect of lumbar spine   ? PONV (postoperative nausea and vomiting)   ? Right knee DJD   ? Shingles 2014  ? face  ? ? ?Past Surgical History:  ?Procedure Laterality Date  ? ANKLE FRACTURE SURGERY Left 1996  ? plated  ? KNEE ARTHROSCOPY Left 4650  ? plica removed  ? KNEE ARTHROSCOPY Right   ? 7 total  ? NASAL SEPTUM SURGERY    ? ORIF DISTAL RADIUS FRACTURE Right   ? age 16  ? REVISION TOTAL KNEE ARTHROPLASTY Right 08/19/2017  ? REVISION TOTAL SHOULDER TO REVERSE TOTAL SHOULDER Left 10/11/2021  ? Procedure: LEFT REVISION TOTAL SHOULDER TO REVERSE TOTAL SHOULDER, RIGHT SHOULDER STERIOD INJECTION;  Surgeon: Hiram Gash, MD;  Location: [facility];  Service: Orthopedics;  Laterality: Left;  ? SHOULDER ARTHROSCOPY W/ ROTATOR CUFF REPAIR Right 2011  ? bicep tendon and labrium repair  ? SHOULDER ARTHROSCOPY WITH ROTATOR CUFF REPAIR Left 2012  ? w/ labrium repair  ? TOTAL KNEE ARTHROPLASTY Right 10/12/2013  ? Procedure: RIGHT TOTAL KNEE ARTHROPLASTY;  Surgeon: Lorn Junes, MD;  Location: [facility];  Service: Orthopedics;  Laterality: Right;  ? VASECTOMY    ? ? ?There were no vitals filed for this visit. ? ? Subjective Assessment - 12/19/21 1233   ? ? Subjective yesterday was good and today pretty good. weekend was rough. wife stretching and taping. week 10   ? Pain Score 1    ? Pain Location Shoulder   ? Pain Orientation Left   ? ?  ?  ? ?  ? ? ? ? ? [facility] PT Assessment - 12/19/21 0001   ? ?  ? AROM  ? Left Shoulder Flexion  170 Degrees   standing  ? Left Shoulder ABduction 163 Degrees   standing  ? Left Shoulder External Rotation 73 Degrees   supine 90 degrees  ? ?  ?  ? ?  ? ? ? ? ? ? ? ? ? ? ? ? ? ? ? ? [facility] Adult PT Treatment/Exercise - 12/19/21 0001   ? ?  ? Shoulder Exercises: Standing  ? External Rotation Strengthening;Left;20 reps   cable pulley  ? Flexion Strengthening;Left;20 reps   cable pulley  ? ABduction Strengthening;Left;20 reps   cable pulleys  ? Other Standing Exercises chest press serratus 2 sets 10   cable pulleys  ?  ? Shoulder Exercises: ROM/Strengthening  ? UBE (Upper Arm Bike) L 5 3 min fwd/3 min back- within precautons   ? Nustep L 3 laterally 3 min   ?  ? Manual Therapy  ? Manual Therapy Passive ROM   ? Passive ROM flex,abd and ER   ? ?  ?  ? ?  ? ? ? ? ? ? ? ? ? ? ? ? PT Short Term Goals - 12/19/21 1323   ? ?  ? PT SHORT TERM GOAL #2  ? Title L shoulder AROM to be at least  160 degrees flexion and ABD, no more than 30 degrees ER AROM   ? Status Achieved   ?  ? PT SHORT TERM GOAL #3  ? Title Will have better understanding of postural awareness and mechanics   ? Status Achieved   ?  ? PT SHORT TERM GOAL #4  ? Title L shoulder strength to be at least 3 to 3+/5 in all tested groups   ? Status Achieved   ? ?  ?  ? ?  ? ? ? ? PT Long Term Goals - 12/19/21 1323   ? ?  ? PT LONG TERM GOAL #1  ? Title L shoulder MMT to be at least 4/5 in all muscle groups   ? Status Partially Met   ?  ? PT LONG TERM GOAL #2  ? Title Will be able to lift a 10# weight overhead with good mechanics and without pain in L shoulder   ? Status On-going   ?  ? PT LONG TERM GOAL #3  ? 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 Status Partially Met   ?  ? PT LONG TERM GOAL #4  ? Title Will be able to perform L shoulder IR and extension to full range as appropriate within limits of protocol   ? Status On-going   ? ?  ?  ? ?   ? ? ? ? ? ? ? ? Plan - 12/19/21 1324   ? ? Clinical Impression Statement STG met. LTG progressing and limited by protocol. week 10 this week- progressed within protocol. ROM increasing - ROM taken at end of session   ? PT Treatment/Interventions ADLs/Self Care Home Management;Cryotherapy;Electrical Stimulation;Iontophoresis 4mg /ml Dexamethasone;Moist Heat;Ultrasound;Functional mobility training;Therapeutic activities;Therapeutic exercise;Patient/family education;Manual techniques;Passive range of motion;Dry needling;Energy conservation;Taping   ? PT Next Visit Plan follow MD prot. week 10 this week   ? ?  ?  ? ?  ? ? ?Patient will benefit from skilled therapeutic intervention in order to improve the following deficits and impairments:  Decreased range of motion, Increased fascial restricitons, Impaired UE functional use, Decreased knowledge of precautions, Pain, Hypomobility, Impaired flexibility, Improper body mechanics, Decreased strength, Increased edema, Postural dysfunction ? ?Visit Diagnosis: ?Stiffness of left shoulder, not elsewhere classified ? ?Abnormal posture ? ?Muscle weakness (generalized) ? ?Acute pain of left shoulder ? ? ? ? ?Problem List ?Patient Active Problem List  ? Diagnosis Date Noted  ? BCC (basal cell carcinoma of skin) 06/29/2019  ? Chronic leukopenia 09/24/2018  ? PCP NOTES >>>>>>>>>>>>>> 09/24/2018  ? Anxiety 09/24/2018  ? Insomnia 09/24/2018  ? History of atrial fibrillation 09/24/2018  ? Irregular heart rhythm 02/24/2016  ? Dysfunction of both eustachian tubes 01/18/2016  ? DJD (degenerative joint disease) 10/12/2013  ? Pars defect of lumbar spine   ? ? ?Ralonda Tartt,[name], PTA ?12/19/2021, 1:26 PM ? ?[facility] ?[place] [address] ?[place], [place], [address] ?Phone: [phone]   Fax:  [phone] ? ?Name: Bruce Cobb ?MRN: [id number] ?Date of Birth: 01-21-1966 ? ? ? ?

## 2021-12-20 ENCOUNTER — Ambulatory Visit: Payer: BC Managed Care – PPO | Admitting: Physical Therapy

## 2021-12-26 ENCOUNTER — Ambulatory Visit: Payer: BC Managed Care – PPO | Admitting: Physical Therapy

## 2021-12-26 DIAGNOSIS — M6281 Muscle weakness (generalized): Secondary | ICD-10-CM

## 2021-12-26 DIAGNOSIS — M25612 Stiffness of left shoulder, not elsewhere classified: Secondary | ICD-10-CM

## 2021-12-26 NOTE — Therapy (Signed)
[facility] ?[place] ?[address] ?[place], [place], [address] ?Phone: [phone]   Fax:  [phone] ? ?Physical Therapy Treatment ? ?Patient Details  ?Name: Bruce Cobb ?MRN: [id number] ?Date of Birth: 1966-01-06 ?Referring Provider (PT): Dax Griffin Basil ? ? ?Encounter Date: 12/26/2021 ? ? PT End of Session - 12/26/21 1407   ? ? Visit Number 11   ? Date for PT Re-Evaluation 01/04/22   ? Authorization Type BCBS   ? Authorization Time Period 11/09/21 to 02/01/22   ? PT Start Time 1311   ? PT Stop Time 1400   ? PT Time Calculation (min) 49 min   ? ?  ?  ? ?  ? ? ?Past Medical History:  ?Diagnosis Date  ? BCC (basal cell carcinoma of skin)   ? Hematospermia   ? hx of, remotely   ? Paroxysmal atrial fibrillation ([facility]) 09/2017  ? saw cards  ? Pars defect of lumbar spine   ? PONV (postoperative nausea and vomiting)   ? Right knee DJD   ? Shingles 2014  ? face  ? ? ?Past Surgical History:  ?Procedure Laterality Date  ? ANKLE FRACTURE SURGERY Left 1996  ? plated  ? KNEE ARTHROSCOPY Left 7425  ? plica removed  ? KNEE ARTHROSCOPY Right   ? 7 total  ? NASAL SEPTUM SURGERY    ? ORIF DISTAL RADIUS FRACTURE Right   ? age 27  ? REVISION TOTAL KNEE ARTHROPLASTY Right 08/19/2017  ? REVISION TOTAL SHOULDER TO REVERSE TOTAL SHOULDER Left 10/11/2021  ? Procedure: LEFT REVISION TOTAL SHOULDER TO REVERSE TOTAL SHOULDER, RIGHT SHOULDER STERIOD INJECTION;  Surgeon: Hiram Gash, MD;  [facility];  Service: Orthopedics;  Laterality: Left;  ? SHOULDER ARTHROSCOPY W/ ROTATOR CUFF REPAIR Right 2011  ? bicep tendon and labrium repair  ? SHOULDER ARTHROSCOPY WITH ROTATOR CUFF REPAIR Left 2012  ? w/ labrium repair  ? TOTAL KNEE ARTHROPLASTY Right 10/12/2013  ? Procedure: RIGHT TOTAL KNEE ARTHROPLASTY;  Surgeon: Lorn Junes, MD;  Location: [facility];  Service: Orthopedics;  Laterality: Right;  ? VASECTOMY    ? ? ?There were no vitals filed for this visit. ? ? Subjective Assessment - 12/26/21 1311   ? ?  Subjective pt had text from MD and starting at  12 weeks No heavy impact or axe throwing- other than that no limitation. went for 2 bike rides over weekend and sore. moving Thursday   ? Currently in Pain? Yes   ? Pain Score 2    ? Pain Location Shoulder   ? Pain Orientation Left   ? ?  ?  ? ?  ? ? ? ? ? [facility] PT Assessment - 12/26/21 0001   ? ?  ? AROM  ? Left Shoulder Flexion 175 Degrees   ? Left Shoulder ABduction 168 Degrees   ? Left Shoulder External Rotation 73 Degrees   ? ?  ?  ? ?  ? ? ? ? ? ? ? ? ? ? ? ? ? ? ? ? [facility] Adult PT Treatment/Exercise - 12/26/21 0001   ? ?  ? Shoulder Exercises: Standing  ? External Rotation Strengthening;Left;20 reps   3# AA  ? Other Standing Exercises 5# cane chest press and flex 2 sets 10   ? Other Standing Exercises body blade 10 x flex and abd   fiter laterally 20x, push /pull 15 x BIL, 10 each left arm only  ?  ? Shoulder Exercises:  ROM/Strengthening  ? UBE (Upper Arm Bike) L 5 3 min fwd/3 min back- within precautons   ?  ? Manual Therapy  ? Passive ROM flex,abd and ER   plusbelt mobs 3# and 5#  ? ?  ?  ? ?  ? ? ? ? ? ? ? ? ? ? ? ? PT Short Term Goals - 12/19/21 1323   ? ?  ? PT SHORT TERM GOAL #2  ? Title L shoulder AROM to be at least 160 degrees flexion and ABD, no more than 30 degrees ER AROM   ? Status Achieved   ?  ? PT SHORT TERM GOAL #3  ? Title Will have better understanding of postural awareness and mechanics   ? Status Achieved   ?  ? PT SHORT TERM GOAL #4  ? Title L shoulder strength to be at least 3 to 3+/5 in all tested groups   ? Status Achieved   ? ?  ?  ? ?  ? ? ? ? PT Long Term Goals - 12/26/21 1404   ? ?  ? PT LONG TERM GOAL #1  ? Title L shoulder MMT to be at least 4/5 in all muscle groups   ? Status Partially Met   ?  ? PT LONG TERM GOAL #2  ? Title Will be able to lift a 10# weight overhead with good mechanics and without pain in L shoulder   ? Baseline not allowed per protocol   ? Status On-going   ?  ? PT LONG TERM GOAL #3  ? Title Will be able to  perform appropriate functional tasks at home and at the farm within limits of protocol/MD guidance without increase in shoulder pain   ? Status Partially Met   ?  ? PT LONG TERM GOAL #4  ? Title Will be able to perform L shoulder IR and extension to full range as appropriate within limits of protocol   ? Status On-going   ? ?  ?  ? ?  ? ? ? ? ? ? ? ? Plan - 12/26/21 1407   ? ? Clinical Impression Statement progressed strengthening and added with belt mobs to increase ROM with overpressure, improvements noted in AROM taken at end of session. ER still very weak. goal progress and ex are liited by protocol until next week , week 12.  text on pt phone no liittaions starting next week except axe throwing and heavy hammering   ? PT Treatment/Interventions ADLs/Self Care Home Management;Cryotherapy;Electrical Stimulation;Iontophoresis 4mg /ml Dexamethasone;Moist Heat;Ultrasound;Functional mobility training;Therapeutic activities;Therapeutic exercise;Patient/family education;Manual techniques;Passive range of motion;Dry needling;Energy conservation;Taping   ? PT Next Visit Plan no limittations next week except axe throwing and heavy hammering   ? ?  ?  ? ?  ? ? ?Patient will benefit from skilled therapeutic intervention in order to improve the following deficits and impairments:  Decreased range of motion, Increased fascial restricitons, Impaired UE functional use, Decreased knowledge of precautions, Pain, Hypomobility, Impaired flexibility, Improper body mechanics, Decreased strength, Increased edema, Postural dysfunction ? ?Visit Diagnosis: ?Stiffness of left shoulder, not elsewhere classified ? ?Muscle weakness (generalized) ? ? ? ? ?Problem List ?Patient Active Problem List  ? Diagnosis Date Noted  ? BCC (basal cell carcinoma of skin) 06/29/2019  ? Chronic leukopenia 09/24/2018  ? PCP NOTES >>>>>>>>>>>>>> 09/24/2018  ? Anxiety 09/24/2018  ? Insomnia 09/24/2018  ? History of atrial fibrillation 09/24/2018  ? Irregular  heart rhythm 02/24/2016  ? Dysfunction of both eustachian tubes  01/18/2016  ? DJD (degenerative joint disease) 10/12/2013  ? Pars defect of lumbar spine   ? ? ?Demont Linford,[name], PTA ?12/26/2021, 2:10 PM ? ?[facility] ?[address] ?[address] ?[place], [place], [address] ?Phone: [phone]   Fax:  [phone] ? ?Name: Bruce Cobb ?MRN: [id number] ?Date of Birth: [birth year] ? ? ? ?

## 2021-12-28 ENCOUNTER — Ambulatory Visit: Payer: BC Managed Care – PPO | Admitting: Physical Therapy

## 2022-01-02 ENCOUNTER — Ambulatory Visit: Payer: BC Managed Care – PPO | Attending: Orthopaedic Surgery | Admitting: Physical Therapy

## 2022-01-02 DIAGNOSIS — M6281 Muscle weakness (generalized): Secondary | ICD-10-CM | POA: Diagnosis not present

## 2022-01-02 DIAGNOSIS — M25612 Stiffness of left shoulder, not elsewhere classified: Secondary | ICD-10-CM | POA: Insufficient documentation

## 2022-01-02 NOTE — Therapy (Signed)
[facility] ?[facility] ?[address] ?[place], [place], [address] ?Phone: [phone]   Fax:  [phone] ? ?Physical Therapy Treatment ? ?Patient Details  ?Name: Bruce Cobb ?MRN: [id number] ?Date of Birth: 11-01-2058 ?Referring Provider (PT): Dax Griffin Basil ? ? ?Encounter Date: 01/02/2022 ? ? PT End of Session - 01/02/22 1447   ? ? Visit Number 12   ? Number of Visits 25   ? Date for PT Re-Evaluation 01/04/22   ? Authorization Time Period 11/09/21 to 02/01/22   ? PT Start Time 1400   ? PT Stop Time 2035   ? PT Time Calculation (min) 45 min   ? ?  ?  ? ?  ? ? ?Past Medical History:  ?Diagnosis Date  ? BCC (basal cell carcinoma of skin)   ? Hematospermia   ? hx of, remotely   ? Paroxysmal atrial fibrillation ([facility]) 09/2017  ? saw cards  ? Pars defect of lumbar spine   ? PONV (postoperative nausea and vomiting)   ? Right knee DJD   ? Shingles 2014  ? face  ? ? ?Past Surgical History:  ?Procedure Laterality Date  ? ANKLE FRACTURE SURGERY Left 1996  ? plated  ? KNEE ARTHROSCOPY Left 5974  ? plica removed  ? KNEE ARTHROSCOPY Right   ? 7 total  ? NASAL SEPTUM SURGERY    ? ORIF DISTAL RADIUS FRACTURE Right   ? age 56  ? REVISION TOTAL KNEE ARTHROPLASTY Right 08/19/2017  ? REVISION TOTAL SHOULDER TO REVERSE TOTAL SHOULDER Left 10/11/2021  ? Procedure: LEFT REVISION TOTAL SHOULDER TO REVERSE TOTAL SHOULDER, RIGHT SHOULDER STERIOD INJECTION;  Surgeon: Hiram Gash, MD;  [facility];  Service: Orthopedics;  Laterality: Left;  ? SHOULDER ARTHROSCOPY W/ ROTATOR CUFF REPAIR Right 2011  ? bicep tendon and labrium repair  ? SHOULDER ARTHROSCOPY WITH ROTATOR CUFF REPAIR Left 2012  ? w/ labrium repair  ? TOTAL KNEE ARTHROPLASTY Right 10/12/2013  ? Procedure: RIGHT TOTAL KNEE ARTHROPLASTY;  Surgeon: Lorn Junes, MD;  Location: [facility];  Service: Orthopedics;  Laterality: Right;  ? VASECTOMY    ? ? ?There were no vitals filed for this visit. ? ? Subjective Assessment - 01/02/22 1410   ? ? Subjective  moved over weekend, lifted more than I should . sor/tighte but okay   ? Currently in Pain? Yes   ? Pain Score 2    ? Pain Location Shoulder   ? Pain Orientation Left   ? ?  ?  ? ?  ? ? ? ? ? ? ? ? ? ? ? ? ? ? ? ? ? ? ? ? [facility] Adult PT Treatment/Exercise - 01/02/22 0001   ? ?  ? Shoulder Exercises: Prone  ? Other Prone Exercises plank UE stab ex   ? Other Prone Exercises push ups 10 x   tricep dip 5 x  ?  ? Shoulder Exercises: Standing  ? External Rotation Strengthening;Left;20 reps   5# cable pulleys  ? Internal Rotation Strengthening;Left;20 reps   10# cable pulley  ? Row Strengthening;Both;20 reps   25# upright  ?  ? Shoulder Exercises: ROM/Strengthening  ? UBE (Upper Arm Bike) L 5 3 min fwd/3 min back- within precautons   ? Lat Pull 20 reps   25#  ? Cybex Row 20 reps   25#  ? Other ROM/Strengthening Exercises cable pulley 10# ext 2 sets 10   ?  ? Manual Therapy  ? Passive  ROM all directions   ? ?  ?  ? ?  ? ? ? ? ? ? ? ? ? ? ? ? PT Short Term Goals - 12/19/21 1323   ? ?  ? PT SHORT TERM GOAL #2  ? Title L shoulder AROM to be at least 160 degrees flexion and ABD, no more than 30 degrees ER AROM   ? Status Achieved   ?  ? PT SHORT TERM GOAL #3  ? Title Will have better understanding of postural awareness and mechanics   ? Status Achieved   ?  ? PT SHORT TERM GOAL #4  ? Title L shoulder strength to be at least 3 to 3+/5 in all tested groups   ? Status Achieved   ? ?  ?  ? ?  ? ? ? ? PT Long Term Goals - 12/26/21 1404   ? ?  ? PT LONG TERM GOAL #1  ? Title L shoulder MMT to be at least 4/5 in all muscle groups   ? Status Partially Met   ?  ? PT LONG TERM GOAL #2  ? Title Will be able to lift a 10# weight overhead with good mechanics and without pain in L shoulder   ? Baseline not allowed per protocol   ? Status On-going   ?  ? PT LONG TERM GOAL #3  ? 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 Status  Partially Met   ?  ? PT LONG TERM GOAL #4  ? Title Will be able to perform L shoulder IR and extension to full range as appropriate within limits of protocol   ? Status On-going   ? ?  ?  ? ?  ? ? ? ? ? ? ? ? Plan - 01/02/22 1447   ? ? Clinical Impression Statement wk 12 free of limittaions so progressed. MD friday tightness and weakness but no signicant pain- cued for speed and control   ? PT Treatment/Interventions ADLs/Self Care Home Management;Cryotherapy;Electrical Stimulation;Iontophoresis 58m/ml Dexamethasone;Moist Heat;Ultrasound;Functional mobility training;Therapeutic activities;Therapeutic exercise;Patient/family education;Manual techniques;Passive range of motion;Dry needling;Energy conservation;Taping   ? PT Next Visit Plan assess MD appt and progress   ? ?  ?  ? ?  ? ? ?Patient will benefit from skilled therapeutic intervention in order to improve the following deficits and impairments:  Decreased range of motion, Increased fascial restricitons, Impaired UE functional use, Decreased knowledge of precautions, Pain, Hypomobility, Impaired flexibility, Improper body mechanics, Decreased strength, Increased edema, Postural dysfunction ? ?Visit Diagnosis: ?Stiffness of left shoulder, not elsewhere classified ? ?Muscle weakness (generalized) ? ? ? ? ?Problem List ?Patient Active Problem List  ? Diagnosis Date Noted  ? BCC (basal cell carcinoma of skin) 06/29/2019  ? Chronic leukopenia 09/24/2018  ? PCP NOTES >>>>>>>>>>>>>> 09/24/2018  ? Anxiety 09/24/2018  ? Insomnia 09/24/2018  ? History of atrial fibrillation 09/24/2018  ? Irregular heart rhythm 02/24/2016  ? Dysfunction of both eustachian tubes 01/18/2016  ? DJD (degenerative joint disease) 10/12/2013  ? Pars defect of lumbar spine   ? ? ?Bruce Cobb,[name], PTA ?01/02/2022, 2:49 PM ? ?[facility] ?[address] ?[place] [address]?Phone: [phone]  Fax:  [phone]? ?Name: Bruce Cobb?MRN:  [id number]?Date of Birth: 905-21-2060? ? ? ?

## 2022-01-04 ENCOUNTER — Ambulatory Visit: Payer: BC Managed Care – PPO | Admitting: Physical Therapy

## 2022-01-09 ENCOUNTER — Ambulatory Visit: Payer: BC Managed Care – PPO | Admitting: Physical Therapy

## 2022-01-09 DIAGNOSIS — M6281 Muscle weakness (generalized): Secondary | ICD-10-CM | POA: Diagnosis not present

## 2022-01-09 DIAGNOSIS — M25612 Stiffness of left shoulder, not elsewhere classified: Secondary | ICD-10-CM

## 2022-01-09 NOTE — Therapy (Signed)
[facility] ?[place] [address] ?[place], [place], [address] ?Phone: [phone]   Fax:  [phone] ? ?Physical Therapy Treatment ? ?Patient Details  ?Name: Bruce Cobb ?MRN: [id number] ?Date of Birth: [birth year] ?Referring Provider (PT): Dax Griffin Basil ? ? ?Encounter Date: 01/09/2022 ? ? PT End of Session - 01/09/22 1448   ? ? Visit Number 13   ? PT Start Time 1400   ? PT Stop Time 2952   ? PT Time Calculation (min) 45 min   ? ?  ?  ? ?  ? ? ?Past Medical History:  ?Diagnosis Date  ? BCC (basal cell carcinoma of skin)   ? Hematospermia   ? hx of, remotely   ? Paroxysmal atrial fibrillation ([facility]) 09/2017  ? saw cards  ? Pars defect of lumbar spine   ? PONV (postoperative nausea and vomiting)   ? Right knee DJD   ? Shingles 2014  ? face  ? ? ?Past Surgical History:  ?Procedure Laterality Date  ? ANKLE FRACTURE SURGERY Left 1996  ? plated  ? KNEE ARTHROSCOPY Left 8413  ? plica removed  ? KNEE ARTHROSCOPY Right   ? 7 total  ? NASAL SEPTUM SURGERY    ? ORIF DISTAL RADIUS FRACTURE Right   ? age 49  ? REVISION TOTAL KNEE ARTHROPLASTY Right 08/19/2017  ? REVISION TOTAL SHOULDER TO REVERSE TOTAL SHOULDER Left 10/11/2021  ? Procedure: LEFT REVISION TOTAL SHOULDER TO REVERSE TOTAL SHOULDER, RIGHT SHOULDER STERIOD INJECTION;  Surgeon: Hiram Gash, MD;  [facility];  Service: Orthopedics;  Laterality: Left;  ? SHOULDER ARTHROSCOPY W/ ROTATOR CUFF REPAIR Right 2011  ? bicep tendon and labrium repair  ? SHOULDER ARTHROSCOPY WITH ROTATOR CUFF REPAIR Left 2012  ? w/ labrium repair  ? TOTAL KNEE ARTHROPLASTY Right 10/12/2013  ? Procedure: RIGHT TOTAL KNEE ARTHROPLASTY;  Surgeon: Lorn Junes, MD;  Location: [facility];  Service: Orthopedics;  Laterality: Right;  ? VASECTOMY    ? ? ?There were no vitals filed for this visit. ? ? Subjective Assessment - 01/09/22 1403   ? ? Subjective really sore from moving, but not pain. MD thursday   ? Currently in Pain? Yes   ? Pain Score 3    ? Pain Location  Shoulder   ? Pain Orientation Left   ? ?  ?  ? ?  ? ? ? ? ? [facility] PT Assessment - 01/09/22 0001   ? ?  ? AROM  ? Left Shoulder Flexion 175 Degrees   ? Left Shoulder ABduction 168 Degrees   ? Left Shoulder Internal Rotation 55 Degrees   ? Left Shoulder External Rotation 73 Degrees   ? ?  ?  ? ?  ? ? ? ? ? ? ? ? ? ? ? ? ? ? ? ? [facility] Adult PT Treatment/Exercise - 01/09/22 0001   ? ?  ? Shoulder Exercises: Seated  ? Other Seated Exercises 4# row, horz abd and shld ext 10x 2 sets   ?  ? Shoulder Exercises: Standing  ? External Rotation Strengthening;Both;20 reps   3#  ? Internal Rotation Strengthening;Left;20 reps   3#  ? Flexion Strengthening;20 reps   3# flex into abd and rev  ? Extension Strengthening;Both;10 reps;Weights   10# pulleys 3 sets  ? Other Standing Exercises wt ball tossing various directions   ?  ? Shoulder Exercises: ROM/Strengthening  ? UBE (Upper Arm Bike) L 5 3 min fwd/3 min back-  within precautons   ? Lat Pull 20 reps   35#  ? Cybex Press 20 reps   15# 2 sets 10 with serratus  ? Cybex Row 20 reps   35#  ?  ? Manual Therapy  ? Joint Mobilization left shld   ? Passive ROM all directions   ? ?  ?  ? ?  ? ? ? ? ? ? ? ? ? ? ? ? PT Short Term Goals - 12/19/21 1323   ? ?  ? PT SHORT TERM GOAL #2  ? Title L shoulder AROM to be at least 160 degrees flexion and ABD, no more than 30 degrees ER AROM   ? Status Achieved   ?  ? PT SHORT TERM GOAL #3  ? Title Will have better understanding of postural awareness and mechanics   ? Status Achieved   ?  ? PT SHORT TERM GOAL #4  ? Title L shoulder strength to be at least 3 to 3+/5 in all tested groups   ? Status Achieved   ? ?  ?  ? ?  ? ? ? ? PT Long Term Goals - 01/09/22 1447   ? ?  ? PT LONG TERM GOAL #1  ? Title L shoulder MMT to be at least 4/5 in all muscle groups   ? Status Partially Met   ?  ? PT LONG TERM GOAL #2  ? Title Will be able to lift a 10# weight overhead with good mechanics and without pain in L shoulder   ? Status Partially Met   ?  ? PT LONG TERM  GOAL #3  ? 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 Status Partially Met   ?  ? PT LONG TERM GOAL #4  ? Title Will be able to perform L shoulder IR and extension to full range as appropriate within limits of protocol   ? Status Partially Met   ? ?  ?  ? ?  ? ? ? ? ? ? ? ? Plan - 01/09/22 1449   ? ? Clinical Impression Statement progressing with goals. ROm increasing. MD foloow up this week. pt has been moving so sore but overall no significant pain. eager to start lifting more but stressed being smart.   ? PT Treatment/Interventions ADLs/Self Care Home Management;Cryotherapy;Electrical Stimulation;Iontophoresis 8m/ml Dexamethasone;Moist Heat;Ultrasound;Functional mobility training;Therapeutic activities;Therapeutic exercise;Patient/family education;Manual techniques;Passive range of motion;Dry needling;Energy conservation;Taping   ? PT Next Visit Plan assess MD appt and progress (pt on vaca next week)   ? ?  ?  ? ?  ? ? ?Patient will benefit from skilled therapeutic intervention in order to improve the following deficits and impairments:  Decreased range of motion, Increased fascial restricitons, Impaired UE functional use, Decreased knowledge of precautions, Pain, Hypomobility, Impaired flexibility, Improper body mechanics, Decreased strength, Increased edema, Postural dysfunction ? ?Visit Diagnosis: ?Stiffness of left shoulder, not elsewhere classified ? ?Muscle weakness (generalized) ? ? ? ? ?Problem List ?Patient Active Problem List  ? Diagnosis Date Noted  ? BCC (basal cell carcinoma of skin) 06/29/2019  ? Chronic leukopenia 09/24/2018  ? PCP NOTES >>>>>>>>>>>>>> 09/24/2018  ? Anxiety 09/24/2018  ? Insomnia 09/24/2018  ? History of atrial fibrillation 09/24/2018  ? Irregular heart rhythm 02/24/2016  ? Dysfunction of both eustachian tubes 01/18/2016  ? DJD (degenerative joint disease) 10/12/2013   ? Pars defect of  lumbar spine   ? ? ?Kadee Philyaw,[name], PTA ?01/09/2022, 2:51 PM ? ?[facility] ?[address] ?[place], [place], [address] ?Phone: [phone]   Fax:  [phone] ? ?Name: Bruce Cobb ?MRN: [id number] ?Date of Birth: [birth year] ? ? ? ?

## 2022-01-11 ENCOUNTER — Ambulatory Visit: Payer: BC Managed Care – PPO | Admitting: Physical Therapy

## 2022-01-11 DIAGNOSIS — M19012 Primary osteoarthritis, left shoulder: Secondary | ICD-10-CM | POA: Diagnosis not present

## 2022-01-23 ENCOUNTER — Ambulatory Visit: Payer: BC Managed Care – PPO | Admitting: Physical Therapy

## 2022-01-23 DIAGNOSIS — M25612 Stiffness of left shoulder, not elsewhere classified: Secondary | ICD-10-CM | POA: Diagnosis not present

## 2022-01-23 DIAGNOSIS — M6281 Muscle weakness (generalized): Secondary | ICD-10-CM

## 2022-01-23 NOTE — Therapy (Signed)
[place]. [place], [place], [address] Phone: [phone]   Fax:  [phone]  Physical Therapy Treatment  Patient Details  Name: Bruce Cobb MRN: [id number] Date of Birth: [birth year] Referring Provider (PT): Ophelia Charter   Encounter Date: 01/23/2022   PT End of Session - 01/23/22 1233     Visit Number 14    Date for PT Re-Evaluation 01/04/22    Authorization Type BCBS    Authorization Time Period 11/09/21 to 02/01/22    PT Start Time 1145    PT Stop Time 1230    PT Time Calculation (min) 45 min             Past Medical History:  Diagnosis Date   BCC (basal cell carcinoma of skin)    Hematospermia    hx of, remotely    Paroxysmal atrial fibrillation ([place]) 09/2017   saw cards   Pars defect of lumbar spine    PONV (postoperative nausea and vomiting)    Right knee DJD    Shingles 2014   face    Past Surgical History:  Procedure Laterality Date   ANKLE FRACTURE SURGERY Left 1996   plated   KNEE ARTHROSCOPY Left 7680   plica removed   KNEE ARTHROSCOPY Right    7 total   NASAL SEPTUM SURGERY     ORIF DISTAL RADIUS FRACTURE Right    age 49   REVISION TOTAL KNEE ARTHROPLASTY Right 08/19/2017   REVISION TOTAL SHOULDER TO REVERSE TOTAL SHOULDER Left 10/11/2021   Procedure: LEFT REVISION TOTAL SHOULDER TO REVERSE TOTAL SHOULDER, RIGHT SHOULDER STERIOD INJECTION;  Surgeon: Hiram Gash, MD;  Location: [facility];  Service: Orthopedics;  Laterality: Left;   SHOULDER ARTHROSCOPY W/ ROTATOR CUFF REPAIR Right 2011   bicep tendon and labrium repair   SHOULDER ARTHROSCOPY WITH ROTATOR CUFF REPAIR Left 2012   w/ labrium repair   TOTAL KNEE ARTHROPLASTY Right 10/12/2013   Procedure: RIGHT TOTAL KNEE ARTHROPLASTY;  Surgeon: Lorn Junes, MD;  Location: [facility];  Service: Orthopedics;  Laterality: Right;   VASECTOMY      There were no vitals filed for this visit.   Subjective Assessment - 01/23/22 1144      Subjective saw MD and he released all precautions. Vacation last week. Cortisone shot RT shld. Using arm alot but pretty good...have alot of digging and chainsaw today    Currently in Pain? Yes    Pain Score 2     Pain Location Shoulder    Pain Orientation Left                [facility] PT Assessment - 01/23/22 0001       AROM   Left Shoulder Flexion 175 Degrees    Left Shoulder ABduction 172 Degrees    Left Shoulder Internal Rotation 65 Degrees   90 degrees abd   Left Shoulder External Rotation 75 Degrees   at 90 abd                          [facility] Adult PT Treatment/Exercise - 01/23/22 0001       Shoulder Exercises: Standing   Flexion Strengthening;Both;10 reps   5#   ABduction Strengthening;Both;10 reps   5#   Other Standing Exercises cable pulley 15# row, high row and chest press15x and ext 10# 15x.   5# 4 pt rhy stab 10 x   Other Standing Exercises  pulleys 10# 15x IR, ER 5# 2 sets 10   ER 3# 4 sets 10 various ways     Shoulder Exercises: ROM/Strengthening   UBE (Upper Arm Bike) L 5 3 min fwd/3 min back- within precautons    Lat Pull 20 reps   35#     Manual Therapy   Joint Mobilization left shld   flex,abd and ER with belt and Wts   Passive ROM all directions                     PT Education - 01/23/22 1233     Education Details IR/ER variations for ROM and strength    Person(s) Educated Patient    Methods Explanation;Demonstration    Comprehension Verbalized understanding;Returned demonstration              PT Short Term Goals - 12/19/21 1323       PT SHORT TERM GOAL #2   Title L shoulder AROM to be at least 160 degrees flexion and ABD, no more than 30 degrees ER AROM    Status Achieved      PT SHORT TERM GOAL #3   Title Will have better understanding of postural awareness and mechanics    Status Achieved      PT SHORT TERM GOAL #4   Title L shoulder strength to be at least 3 to 3+/5 in all tested groups    Status  Achieved               PT Long Term Goals - 01/23/22 1233       PT LONG TERM GOAL #1   Title L shoulder MMT to be at least 4/5 in all muscle groups    Baseline flex and abd 4+ and rotators 4-    Status Partially Met      PT LONG TERM GOAL #2   Title Will be able to lift a 10# weight overhead with good mechanics and without pain in L shoulder    Status Partially Met      PT LONG TERM GOAL #3   Title Will be able to perform appropriate functional tasks at home and at the farm within limits of protocol/MD guidance without increase in shoulder pain    Status Partially Met      PT LONG TERM GOAL #4   Title Will be able to perform L shoulder IR and extension to full range as appropriate within limits of protocol    Status Partially Met                   Plan - 01/23/22 1235     Clinical Impression Statement freed of all precautions- progressed ther ex for strength and ROM. progressing with goals. reviewed and added to HEP    PT Treatment/Interventions ADLs/Self Care Home Management;Cryotherapy;Electrical Stimulation;Iontophoresis 4mg /ml Dexamethasone;Moist Heat;Ultrasound;Functional mobility training;Therapeutic activities;Therapeutic exercise;Patient/family education;Manual techniques;Passive range of motion;Dry needling;Energy conservation;Taping    PT Next Visit Plan continue to push strength and func ROM             Patient will benefit from skilled therapeutic intervention in order to improve the following deficits and impairments:  Decreased range of motion, Increased fascial restricitons, Impaired UE functional use, Decreased knowledge of precautions, Pain, Hypomobility, Impaired flexibility, Improper body mechanics, Decreased strength, Increased edema, Postural dysfunction  Visit Diagnosis: Stiffness of left shoulder, not elsewhere classified  Muscle weakness (generalized)     Problem List Patient Active Problem List  Diagnosis Date Noted   BCC  (basal cell carcinoma of skin) 06/29/2019   Chronic leukopenia 09/24/2018   PCP NOTES >>>>>>>>>>>>>> 09/24/2018   Anxiety 09/24/2018   Insomnia 09/24/2018   History of atrial fibrillation 09/24/2018   Irregular heart rhythm 02/24/2016   Dysfunction of both eustachian tubes 01/18/2016   DJD (degenerative joint disease) 10/12/2013   Pars defect of lumbar spine     [name],[name], PTA 01/23/2022, 12:36 PM  [facility]. [place], [place], [address] Phone: [phone]   Fax:  [phone]  Name: Bruce Cobb MRN: [id number] Date of Birth: 1965-12-21

## 2022-01-30 ENCOUNTER — Ambulatory Visit: Payer: BC Managed Care – PPO | Admitting: Physical Therapy

## 2022-02-01 ENCOUNTER — Ambulatory Visit: Payer: BC Managed Care – PPO | Admitting: Physical Therapy

## 2022-02-02 DIAGNOSIS — R030 Elevated blood-pressure reading, without diagnosis of hypertension: Secondary | ICD-10-CM | POA: Diagnosis not present

## 2022-02-02 DIAGNOSIS — G8929 Other chronic pain: Secondary | ICD-10-CM | POA: Diagnosis not present

## 2022-02-02 DIAGNOSIS — J309 Allergic rhinitis, unspecified: Secondary | ICD-10-CM | POA: Diagnosis not present

## 2022-02-02 DIAGNOSIS — H1030 Unspecified acute conjunctivitis, unspecified eye: Secondary | ICD-10-CM | POA: Diagnosis not present

## 2022-02-02 DIAGNOSIS — Z79899 Other long term (current) drug therapy: Secondary | ICD-10-CM | POA: Diagnosis not present

## 2022-02-02 DIAGNOSIS — M25519 Pain in unspecified shoulder: Secondary | ICD-10-CM | POA: Diagnosis not present

## 2022-02-06 ENCOUNTER — Ambulatory Visit: Payer: BC Managed Care – PPO | Admitting: Physical Therapy

## 2022-02-06 DIAGNOSIS — Z79899 Other long term (current) drug therapy: Secondary | ICD-10-CM | POA: Diagnosis not present

## 2022-02-10 IMAGING — DX DG SHOULDER 2+V*L*
1 series · 1 of 1 positions shown · non-contrast
Comparison: Previous studies including the CT left shoulder done on
07/08/2020

CLINICAL DATA: Status post left shoulder arthroplasty

EXAM:
LEFT SHOULDER - 2+ VIEW

[shoulder ap]
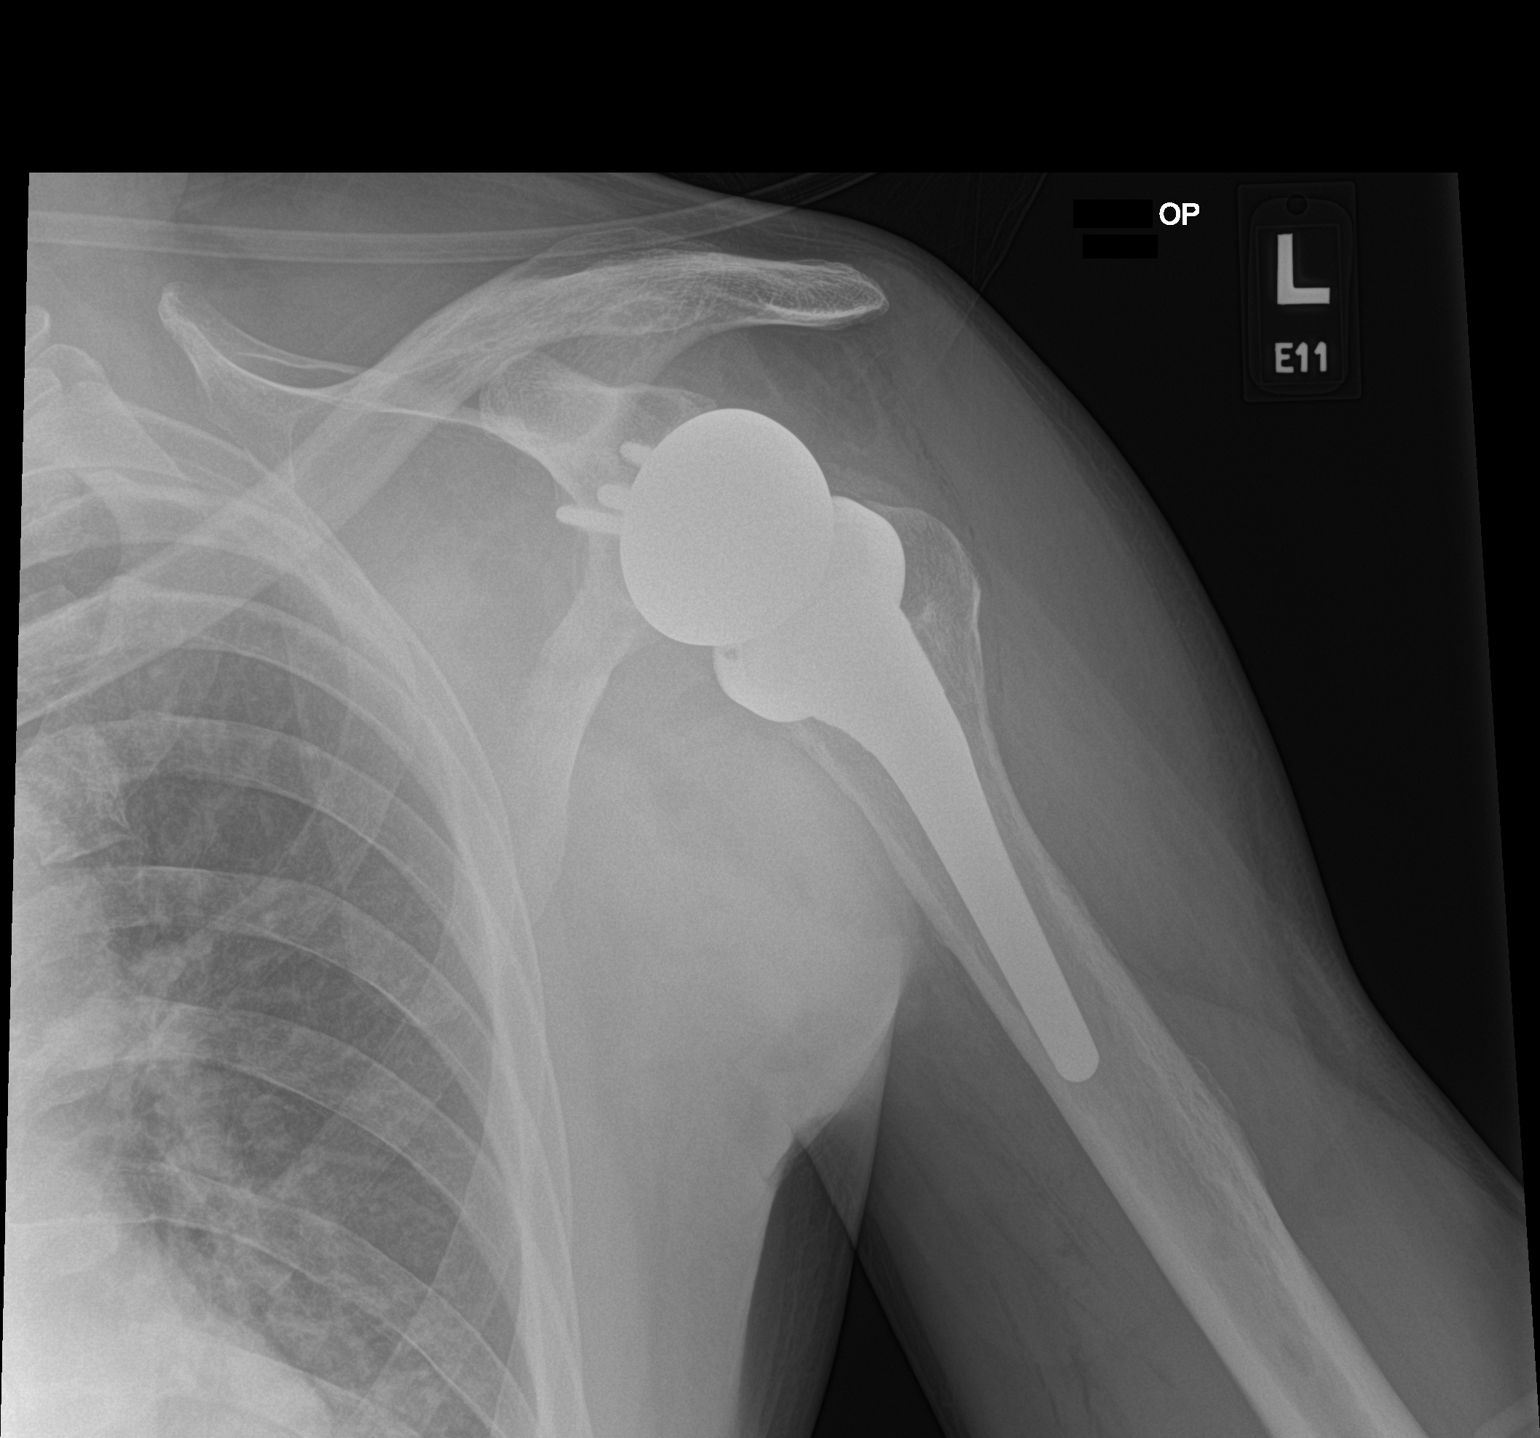

[1 of 1 positions shown; findings below may reference images not displayed]

FINDINGS: There is interval left shoulder arthroplasty. No fracture or
dislocation is seen.
IMPRESSION: Status post left shoulder arthroplasty.

## 2022-02-20 ENCOUNTER — Ambulatory Visit: Payer: BC Managed Care – PPO | Admitting: Physical Therapy

## 2022-02-27 ENCOUNTER — Encounter: Payer: Self-pay | Admitting: Physical Therapy

## 2022-02-27 ENCOUNTER — Ambulatory Visit: Payer: BC Managed Care – PPO | Attending: Orthopaedic Surgery | Admitting: Physical Therapy

## 2022-02-27 DIAGNOSIS — R293 Abnormal posture: Secondary | ICD-10-CM

## 2022-02-27 DIAGNOSIS — M6281 Muscle weakness (generalized): Secondary | ICD-10-CM

## 2022-02-27 DIAGNOSIS — M25512 Pain in left shoulder: Secondary | ICD-10-CM

## 2022-02-27 DIAGNOSIS — M25612 Stiffness of left shoulder, not elsewhere classified: Secondary | ICD-10-CM | POA: Diagnosis not present

## 2022-03-01 ENCOUNTER — Encounter: Payer: Self-pay | Admitting: Physical Therapy

## 2022-03-01 ENCOUNTER — Ambulatory Visit: Payer: BC Managed Care – PPO | Admitting: Physical Therapy

## 2022-03-01 DIAGNOSIS — M25612 Stiffness of left shoulder, not elsewhere classified: Secondary | ICD-10-CM | POA: Diagnosis not present

## 2022-03-01 DIAGNOSIS — M25512 Pain in left shoulder: Secondary | ICD-10-CM | POA: Diagnosis not present

## 2022-03-01 DIAGNOSIS — M6281 Muscle weakness (generalized): Secondary | ICD-10-CM | POA: Diagnosis not present

## 2022-03-01 DIAGNOSIS — R293 Abnormal posture: Secondary | ICD-10-CM

## 2022-03-01 NOTE — Therapy (Signed)
[facility]. [place], [place], [address] Phone: [phone]   Fax:  [phone]  Physical Therapy Treatment  Patient Details  Name: Bruce Cobb MRN: [id number] Date of Birth: 27-Nov-1965 Referring Provider (PT): Ophelia Charter   Encounter Date: 03/01/2022   PT End of Session - 03/01/22 1515     Visit Number 16    Date for PT Re-Evaluation 03/29/22    PT Start Time 1430    PT Stop Time 1515    PT Time Calculation (min) 45 min    Activity Tolerance Patient tolerated treatment well    Behavior During Therapy [facility] for tasks assessed/performed             Past Medical History:  Diagnosis Date   BCC (basal cell carcinoma of skin)    Hematospermia    hx of, remotely    Paroxysmal atrial fibrillation ([place]) 09/2017   saw cards   Pars defect of lumbar spine    PONV (postoperative nausea and vomiting)    Right knee DJD    Shingles 2014   face    Past Surgical History:  Procedure Laterality Date   ANKLE FRACTURE SURGERY Left 1996   plated   KNEE ARTHROSCOPY Left 0932   plica removed   KNEE ARTHROSCOPY Right    7 total   NASAL SEPTUM SURGERY     ORIF DISTAL RADIUS FRACTURE Right    age 4   REVISION TOTAL KNEE ARTHROPLASTY Right 08/19/2017   REVISION TOTAL SHOULDER TO REVERSE TOTAL SHOULDER Left 10/11/2021   Procedure: LEFT REVISION TOTAL SHOULDER TO REVERSE TOTAL SHOULDER, RIGHT SHOULDER STERIOD INJECTION;  Surgeon: Hiram Gash, MD;  Location: [facility];  Service: Orthopedics;  Laterality: Left;   SHOULDER ARTHROSCOPY W/ ROTATOR CUFF REPAIR Right 2011   bicep tendon and labrium repair   SHOULDER ARTHROSCOPY WITH ROTATOR CUFF REPAIR Left 2012   w/ labrium repair   TOTAL KNEE ARTHROPLASTY Right 10/12/2013   Procedure: RIGHT TOTAL KNEE ARTHROPLASTY;  Surgeon: Lorn Junes, MD;  Location: [facility];  Service: Orthopedics;  Laterality: Right;   VASECTOMY      There were no vitals filed for this visit.   Subjective  Assessment - 03/01/22 1436     Subjective Not too bad. Had a massage yesterday. Feel better today than Tuesday    Currently in Pain? Yes    Pain Score 1     Pain Location Neck                               [facility] Adult PT Treatment/Exercise - 03/01/22 0001       Shoulder Exercises: Supine   Other Supine Exercises LUE ER/IT 4lb 2x10      Shoulder Exercises: Seated   Other Seated Exercises Rows & Lats 45lb 2x10      Shoulder Exercises: Standing   External Rotation Strengthening;Both;Theraband;10 reps   x3   Theraband Level (Shoulder External Rotation) Level 2 (Red)    Internal Rotation Strengthening;Left;10 reps;Theraband   x3   Theraband Level (Shoulder Internal Rotation) Level 2 (Red)    Flexion Strengthening;Both;Weights;20 reps    Shoulder Flexion Weight (lbs) 4    ABduction Strengthening;Both;20 reps;Weights    Shoulder ABduction Weight (lbs) 4    Extension Strengthening;Theraband;20 reps;Both    Extension Weight (lbs) 10      Shoulder Exercises: ROM/Strengthening   UBE (Upper Arm Bike)  L 3 3 min fwd/3 min back      Manual Therapy   Manual Therapy Passive ROM    Manual therapy comments End Range holds    Joint Mobilization left shld    Passive ROM all directions                       PT Short Term Goals - 12/19/21 1323       PT SHORT TERM GOAL #2   Title L shoulder AROM to be at least 160 degrees flexion and ABD, no more than 30 degrees ER AROM    Status Achieved      PT SHORT TERM GOAL #3   Title Will have better understanding of postural awareness and mechanics    Status Achieved      PT SHORT TERM GOAL #4   Title L shoulder strength to be at least 3 to 3+/5 in all tested groups    Status Achieved               PT Long Term Goals - 02/27/22 1558       PT LONG TERM GOAL #1   Title L shoulder MMT to be at least 4/5 in all muscle groups    Status Partially Met      PT LONG TERM GOAL #2   Title Will be able to  lift a 10# weight overhead with good mechanics and without pain in L shoulder    Status Partially Met      PT LONG TERM GOAL #3   Title Will be able to perform appropriate functional tasks at home and at the farm within limits of protocol/MD guidance without increase in shoulder pain    Status Partially Met      PT LONG TERM GOAL #4   Title Will be able to perform L shoulder IR and extension to full range as appropriate within limits of protocol    Status Partially Met                   Plan - 03/01/22 1516     Clinical Impression Statement Pt ~ 5 in late for today's session. Started session with MT after aerobic warm up to improve ROM. Some ROM limitation with supine ER/IR. PT did well with all interventions and give good effort. Tactile cue for posture needed with standing rows and extensions. Some weakness present with LUE IR.    Personal Factors and Comorbidities Past/Current Experience;Social Background;Time since onset of injury/illness/exacerbation    Examination-Activity Limitations Bathing;Bed Mobility;Reach Overhead;Caring for Others;Carry;Dressing;Hygiene/Grooming;Lift    Examination-Participation Restrictions Occupation;Cleaning;Community Activity;Driving;Shop;Yard Work    Stability/Clinical Decision Making Stable/Uncomplicated    Rehab Potential Good    PT Frequency 2x / week    PT Duration 12 weeks    PT Treatment/Interventions ADLs/Self Care Home Management;Cryotherapy;Electrical Stimulation;Iontophoresis 4mg /ml Dexamethasone;Moist Heat;Ultrasound;Functional mobility training;Therapeutic activities;Therapeutic exercise;Patient/family education;Manual techniques;Passive range of motion;Dry needling;Energy conservation;Taping    PT Next Visit Plan continue to push strength and func ROM             Patient will benefit from skilled therapeutic intervention in order to improve the following deficits and impairments:  Decreased range of motion, Increased fascial  restricitons, Impaired UE functional use, Decreased knowledge of precautions, Pain, Hypomobility, Impaired flexibility, Improper body mechanics, Decreased strength, Increased edema, Postural dysfunction  Visit Diagnosis: Stiffness of left shoulder, not elsewhere classified  Muscle weakness (generalized)  Abnormal posture  Acute pain of left shoulder  Problem List Patient Active Problem List   Diagnosis Date Noted   BCC (basal cell carcinoma of skin) 06/29/2019   Chronic leukopenia 09/24/2018   PCP NOTES >>>>>>>>>>>>>> 09/24/2018   Anxiety 09/24/2018   Insomnia 09/24/2018   History of atrial fibrillation 09/24/2018   Irregular heart rhythm 02/24/2016   Dysfunction of both eustachian tubes 01/18/2016   DJD (degenerative joint disease) 10/12/2013   Pars defect of lumbar spine     Scot Jun, PTA 03/01/2022, 3:20 PM  [place]. [place], [place], [address] Phone: [phone]   Fax:  [phone]  Name: Bruce Cobb MRN: [id number] Date of Birth: Jan 28, 1966

## 2022-03-13 ENCOUNTER — Ambulatory Visit: Payer: BC Managed Care – PPO | Attending: Orthopaedic Surgery | Admitting: Physical Therapy

## 2022-03-13 DIAGNOSIS — R293 Abnormal posture: Secondary | ICD-10-CM | POA: Diagnosis not present

## 2022-03-13 DIAGNOSIS — M6281 Muscle weakness (generalized): Secondary | ICD-10-CM | POA: Insufficient documentation

## 2022-03-13 DIAGNOSIS — M25612 Stiffness of left shoulder, not elsewhere classified: Secondary | ICD-10-CM | POA: Insufficient documentation

## 2022-03-13 NOTE — Therapy (Signed)
[facility]. [place], [place], [address] Phone: [phone]   Fax:  [phone]  Physical Therapy Treatment  Patient Details  Name: Bruce Cobb MRN: [id number] Date of Birth: 06-18-1966 Referring Provider (PT): Ophelia Charter   Encounter Date: 03/13/2022   PT End of Session - 03/13/22 1412     Visit Number 17    Number of Visits 25    Date for PT Re-Evaluation 03/29/22    Authorization Type BCBS    Authorization Time Period 02/27/22 - 03/29/22    PT Start Time 1315    PT Stop Time 1400    PT Time Calculation (min) 45 min             Past Medical History:  Diagnosis Date   BCC (basal cell carcinoma of skin)    Hematospermia    hx of, remotely    Paroxysmal atrial fibrillation ([place]) 09/2017   saw cards   Pars defect of lumbar spine    PONV (postoperative nausea and vomiting)    Right knee DJD    Shingles 2014   face    Past Surgical History:  Procedure Laterality Date   ANKLE FRACTURE SURGERY Left 1996   plated   KNEE ARTHROSCOPY Left 0100   plica removed   KNEE ARTHROSCOPY Right    7 total   NASAL SEPTUM SURGERY     ORIF DISTAL RADIUS FRACTURE Right    age 61   REVISION TOTAL KNEE ARTHROPLASTY Right 08/19/2017   REVISION TOTAL SHOULDER TO REVERSE TOTAL SHOULDER Left 10/11/2021   Procedure: LEFT REVISION TOTAL SHOULDER TO REVERSE TOTAL SHOULDER, RIGHT SHOULDER STERIOD INJECTION;  Surgeon: Hiram Gash, MD;  Location: [facility];  Service: Orthopedics;  Laterality: Left;   SHOULDER ARTHROSCOPY W/ ROTATOR CUFF REPAIR Right 2011   bicep tendon and labrium repair   SHOULDER ARTHROSCOPY WITH ROTATOR CUFF REPAIR Left 2012   w/ labrium repair   TOTAL KNEE ARTHROPLASTY Right 10/12/2013   Procedure: RIGHT TOTAL KNEE ARTHROPLASTY;  Surgeon: Lorn Junes, MD;  Location: [facility];  Service: Orthopedics;  Laterality: Right;   VASECTOMY      There were no vitals filed for this visit.   Subjective Assessment -  03/13/22 1315     Subjective been on vacation, just stiff    Currently in Pain? Yes    Pain Score 1     Pain Location Shoulder    Pain Orientation Left                [facility] PT Assessment - 03/13/22 0001       AROM   Left Shoulder Flexion 154 Degrees    Left Shoulder ABduction 152 Degrees    Left Shoulder Internal Rotation 60 Degrees    Left Shoulder External Rotation 80 Degrees      Strength   Overall Strength Comments left shld flex and abd 5/5, IR/ER 4/5                           [facility] Adult PT Treatment/Exercise - 03/13/22 0001       Shoulder Exercises: Supine   Other Supine Exercises LUE ER/IT 4lb 2x10      Shoulder Exercises: Seated   Other Seated Exercises Rows & Lats 45lb 2x10    Other Seated Exercises row,ext and abd 4# 2 sets 10      Shoulder Exercises: Standing   Other  Standing Exercises cable pulleys shld ext,row and chest press    Other Standing Exercises 4# 4 way rhy stab on wall      Shoulder Exercises: ROM/Strengthening   UBE (Upper Arm Bike) L 5 3 min fwd/3 min back      Manual Therapy   Manual Therapy Passive ROM    Manual therapy comments End Range holds    Joint Mobilization left shld    Passive ROM all directions                       PT Short Term Goals - 12/19/21 1323       PT SHORT TERM GOAL #2   Title L shoulder AROM to be at least 160 degrees flexion and ABD, no more than 30 degrees ER AROM    Status Achieved      PT SHORT TERM GOAL #3   Title Will have better understanding of postural awareness and mechanics    Status Achieved      PT SHORT TERM GOAL #4   Title L shoulder strength to be at least 3 to 3+/5 in all tested groups    Status Achieved               PT Long Term Goals - 03/13/22 1410       PT LONG TERM GOAL #1   Title L shoulder MMT to be at least 4/5 in all muscle groups    Status Partially Met      PT LONG TERM GOAL #2   Title Will be able to lift a 10# weight overhead  with good mechanics and without pain in L shoulder    Status Partially Met      PT LONG TERM GOAL #3   Title Will be able to perform appropriate functional tasks at home and at the farm within limits of protocol/MD guidance without increase in shoulder pain    Status Partially Met      PT LONG TERM GOAL #4   Title Will be able to perform L shoulder IR and extension to full range as appropriate within limits of protocol    Status Partially Met                   Plan - 03/13/22 1412     Clinical Impression Statement pt with c/o BIL shld pain and stiffness during session. pt needed verb and tactile postural cuing esp to engage with scapular mvmt and hold head up. the more we did the better his ROM got. PROM and strteching improved as we went but needed stab to prevent compensation. ROM taken after MT and still not as good as it was back in May- which is when pt stated traveling moe and limited sessions. reviewed ex for home and stressed need for increased PT compliance    PT Treatment/Interventions ADLs/Self Care Home Management;Cryotherapy;Electrical Stimulation;Iontophoresis 4mg /ml Dexamethasone;Moist Heat;Ultrasound;Functional mobility training;Therapeutic activities;Therapeutic exercise;Patient/family education;Manual techniques;Passive range of motion;Dry needling;Energy conservation;Taping    PT Next Visit Plan continue to push strength and func ROM             Patient will benefit from skilled therapeutic intervention in order to improve the following deficits and impairments:  Decreased range of motion, Increased fascial restricitons, Impaired UE functional use, Decreased knowledge of precautions, Pain, Hypomobility, Impaired flexibility, Improper body mechanics, Decreased strength, Increased edema, Postural dysfunction  Visit Diagnosis: Stiffness of left shoulder, not elsewhere classified  Muscle weakness (generalized)  Abnormal posture     Problem List Patient  Active Problem List   Diagnosis Date Noted   BCC (basal cell carcinoma of skin) 06/29/2019   Chronic leukopenia 09/24/2018   PCP NOTES >>>>>>>>>>>>>> 09/24/2018   Anxiety 09/24/2018   Insomnia 09/24/2018   History of atrial fibrillation 09/24/2018   Irregular heart rhythm 02/24/2016   Dysfunction of both eustachian tubes 01/18/2016   DJD (degenerative joint disease) 10/12/2013   Pars defect of lumbar spine     Jauan Wohl,[name], PTA 03/13/2022, 2:16 PM  [place]. [place], [place], [address] Phone: [phone]   Fax:  [phone]  Name: ZYIRE EIDSON MRN: [id number] Date of Birth: 24-May-1966

## 2022-03-15 ENCOUNTER — Ambulatory Visit: Payer: BC Managed Care – PPO | Admitting: Physical Therapy

## 2022-03-15 DIAGNOSIS — R293 Abnormal posture: Secondary | ICD-10-CM

## 2022-03-15 DIAGNOSIS — M25612 Stiffness of left shoulder, not elsewhere classified: Secondary | ICD-10-CM | POA: Diagnosis not present

## 2022-03-15 DIAGNOSIS — M6281 Muscle weakness (generalized): Secondary | ICD-10-CM

## 2022-03-15 NOTE — Therapy (Signed)
[place]. [place], [place], [address] Phone: [phone]   Fax:  [phone]  Physical Therapy Treatment  Patient Details  Name: Bruce Cobb MRN: [id number] Date of Birth: 06/04/1966 Referring Provider (PT): Ophelia Charter   Encounter Date: 03/15/2022   PT End of Session - 03/15/22 1446     Visit Number 18    Date for PT Re-Evaluation 03/29/22    Authorization Type BCBS    Authorization - Number of Visits 30    PT Start Time 1400    PT Stop Time 4492    PT Time Calculation (min) 45 min             Past Medical History:  Diagnosis Date   BCC (basal cell carcinoma of skin)    Hematospermia    hx of, remotely    Paroxysmal atrial fibrillation ([place]) 09/2017   saw cards   Pars defect of lumbar spine    PONV (postoperative nausea and vomiting)    Right knee DJD    Shingles 2014   face    Past Surgical History:  Procedure Laterality Date   ANKLE FRACTURE SURGERY Left 1996   plated   KNEE ARTHROSCOPY Left 0100   plica removed   KNEE ARTHROSCOPY Right    7 total   NASAL SEPTUM SURGERY     ORIF DISTAL RADIUS FRACTURE Right    age 7   REVISION TOTAL KNEE ARTHROPLASTY Right 08/19/2017   REVISION TOTAL SHOULDER TO REVERSE TOTAL SHOULDER Left 10/11/2021   Procedure: LEFT REVISION TOTAL SHOULDER TO REVERSE TOTAL SHOULDER, RIGHT SHOULDER STERIOD INJECTION;  Surgeon: Hiram Gash, MD;  Location: [facility];  Service: Orthopedics;  Laterality: Left;   SHOULDER ARTHROSCOPY W/ ROTATOR CUFF REPAIR Right 2011   bicep tendon and labrium repair   SHOULDER ARTHROSCOPY WITH ROTATOR CUFF REPAIR Left 2012   w/ labrium repair   TOTAL KNEE ARTHROPLASTY Right 10/12/2013   Procedure: RIGHT TOTAL KNEE ARTHROPLASTY;  Surgeon: Lorn Junes, MD;  Location: [facility];  Service: Orthopedics;  Laterality: Right;   VASECTOMY      There were no vitals filed for this visit.   Subjective Assessment - 03/15/22 1359     Subjective  same-stiff but felt good after last session with stretch    Currently in Pain? Yes    Pain Score 2     Pain Location Shoulder    Pain Orientation Left                               [facility] Adult PT Treatment/Exercise - 03/15/22 0001       Shoulder Exercises: Standing   Flexion Strengthening;Left;12 reps    Shoulder Flexion Weight (lbs) 6    ABduction Strengthening;Left;12 reps   6#   Extension Strengthening;Both;20 reps   10# cable pulleys   Row Strengthening;Both;20 reps   10# cable pulleys   Diagonals 20 reps;Strengthening;Left   cable pulleys   Diagonals Weight (lbs) 6#    Other Standing Exercises wt ball circles behind back for rotation , limited IR      Shoulder Exercises: ROM/Strengthening   UBE (Upper Arm Bike) L 5 3 min fwd/3 min back      Manual Therapy   Manual Therapy Passive ROM    Manual therapy comments End Range holds   prones scap mobs,ext and IR   Joint Mobilization left shld,scap  Passive ROM all directions                       PT Short Term Goals - 12/19/21 1323       PT SHORT TERM GOAL #2   Title L shoulder AROM to be at least 160 degrees flexion and ABD, no more than 30 degrees ER AROM    Status Achieved      PT SHORT TERM GOAL #3   Title Will have better understanding of postural awareness and mechanics    Status Achieved      PT SHORT TERM GOAL #4   Title L shoulder strength to be at least 3 to 3+/5 in all tested groups    Status Achieved               PT Long Term Goals - 03/13/22 1410       PT LONG TERM GOAL #1   Title L shoulder MMT to be at least 4/5 in all muscle groups    Status Partially Met      PT LONG TERM GOAL #2   Title Will be able to lift a 10# weight overhead with good mechanics and without pain in L shoulder    Status Partially Met      PT LONG TERM GOAL #3   Title Will be able to perform appropriate functional tasks at home and at the farm within limits of protocol/MD guidance  without increase in shoulder pain    Status Partially Met      PT LONG TERM GOAL #4   Title Will be able to perform L shoulder IR and extension to full range as appropriate within limits of protocol    Status Partially Met                   Plan - 03/15/22 1446     Clinical Impression Statement limited IR and ext esp with func ex and cued for compensations, prone mvmt iwth scap mobs. progressing strength and ROM. slow progression with goals.    PT Treatment/Interventions ADLs/Self Care Home Management;Cryotherapy;Electrical Stimulation;Iontophoresis 81m/ml Dexamethasone;Moist Heat;Ultrasound;Functional mobility training;Therapeutic activities;Therapeutic exercise;Patient/family education;Manual techniques;Passive range of motion;Dry needling;Energy conservation;Taping    PT Next Visit Plan continue to push strength and func ROM             Patient will benefit from skilled therapeutic intervention in order to improve the following deficits and impairments:  Decreased range of motion, Increased fascial restricitons, Impaired UE functional use, Decreased knowledge of precautions, Pain, Hypomobility, Impaired flexibility, Improper body mechanics, Decreased strength, Increased edema, Postural dysfunction  Visit Diagnosis: Stiffness of left shoulder, not elsewhere classified  Muscle weakness (generalized)  Abnormal posture     Problem List Patient Active Problem List   Diagnosis Date Noted   BCC (basal cell carcinoma of skin) 06/29/2019   Chronic leukopenia 09/24/2018   PCP NOTES >>>>>>>>>>>>>> 09/24/2018   Anxiety 09/24/2018   Insomnia 09/24/2018   History of atrial fibrillation 09/24/2018   Irregular heart rhythm 02/24/2016   Dysfunction of both eustachian tubes 01/18/2016   DJD (degenerative joint disease) 10/12/2013   Pars defect of lumbar spine     Bruce Cobb,Bruce Cobb, PTA 03/15/2022, 2:48 PM  [place] [address]Phone: [phone]  Fax:  [phone] Name: Bruce JAQUITHMRN: [id number] of Birth: 902/06/1966

## 2022-03-20 ENCOUNTER — Ambulatory Visit: Payer: BC Managed Care – PPO | Admitting: Physical Therapy

## 2022-03-20 DIAGNOSIS — M6281 Muscle weakness (generalized): Secondary | ICD-10-CM

## 2022-03-20 DIAGNOSIS — M25612 Stiffness of left shoulder, not elsewhere classified: Secondary | ICD-10-CM

## 2022-03-20 DIAGNOSIS — R293 Abnormal posture: Secondary | ICD-10-CM

## 2022-03-20 NOTE — Therapy (Signed)
[facility] [address]. [place], [place], [address] Phone: [phone]   Fax:  [phone]  Physical Therapy Treatment  Patient Details  Name: Bruce Cobb MRN: [id number] Date of Birth: 1965/12/09 Referring Provider (PT): Ramond Marrow   Encounter Date: 03/20/2022   PT End of Session - 03/20/22 1430     Visit Number 19    Number of Visits 25    Date for PT Re-Evaluation 03/29/22    Authorization Type BCBS    Authorization Time Period 02/27/22 - 03/29/22    PT Start Time 1330    PT Stop Time 1420    PT Time Calculation (min) 50 min             Past Medical History:  Diagnosis Date   BCC (basal cell carcinoma of skin)    Hematospermia    hx of, remotely    Paroxysmal atrial fibrillation ([facility]) 09/2017   saw cards   Pars defect of lumbar spine    PONV (postoperative nausea and vomiting)    Right knee DJD    Shingles 2014   face    Past Surgical History:  Procedure Laterality Date   ANKLE FRACTURE SURGERY Left 1996   plated   KNEE ARTHROSCOPY Left 1990   plica removed   KNEE ARTHROSCOPY Right    7 total   NASAL SEPTUM SURGERY     ORIF DISTAL RADIUS FRACTURE Right    age 73   REVISION TOTAL KNEE ARTHROPLASTY Right 08/19/2017   REVISION TOTAL SHOULDER TO REVERSE TOTAL SHOULDER Left 10/11/2021   Procedure: LEFT REVISION TOTAL SHOULDER TO REVERSE TOTAL SHOULDER, RIGHT SHOULDER STERIOD INJECTION;  Surgeon: Bjorn Pippin, MD;  Location: [facility];  Service: Orthopedics;  Laterality: Left;   SHOULDER ARTHROSCOPY W/ ROTATOR CUFF REPAIR Right 2011   bicep tendon and labrium repair   SHOULDER ARTHROSCOPY WITH ROTATOR CUFF REPAIR Left 2012   w/ labrium repair   TOTAL KNEE ARTHROPLASTY Right 10/12/2013   Procedure: RIGHT TOTAL KNEE ARTHROPLASTY;  Surgeon: Nilda Simmer, MD;  Location: [facility];  Service: Orthopedics;  Laterality: Right;   VASECTOMY      There were no vitals filed for this visit.   Subjective Assessment -  03/20/22 1425     Subjective stiff- been on zero turn all weekend    Currently in Pain? Yes    Pain Score 2     Pain Location Shoulder    Pain Orientation Left                               [facility] Adult PT Treatment/Exercise - 03/20/22 0001       Shoulder Exercises: Prone   Other Prone Exercises 4# I,T,W      Shoulder Exercises: Standing   Other Standing Exercises free wts and pulleys      Manual Therapy   Manual Therapy Passive ROM;Soft tissue mobilization    Manual therapy comments belt mobs    Joint Mobilization left shld,scap    Passive ROM all directions                       PT Short Term Goals - 12/19/21 1323       PT SHORT TERM GOAL #2   Title L shoulder AROM to be at least 160 degrees flexion and ABD, no more than 30 degrees ER AROM  Status Achieved      PT SHORT TERM GOAL #3   Title Will have better understanding of postural awareness and mechanics    Status Achieved      PT SHORT TERM GOAL #4   Title L shoulder strength to be at least 3 to 3+/5 in all tested groups    Status Achieved               PT Long Term Goals - 03/13/22 1410       PT LONG TERM GOAL #1   Title L shoulder MMT to be at least 4/5 in all muscle groups    Status Partially Met      PT LONG TERM GOAL #2   Title Will be able to lift a 10# weight overhead with good mechanics and without pain in L shoulder    Status Partially Met      PT LONG TERM GOAL #3   Title Will be able to perform appropriate functional tasks at home and at the farm within limits of protocol/MD guidance without increase in shoulder pain    Status Partially Met      PT LONG TERM GOAL #4   Title Will be able to perform L shoulder IR and extension to full range as appropriate within limits of protocol    Status Partially Met                   Plan - 03/20/22 1430     Clinical Impression Statement pt arrived with increased stiffness after using mower all  weekend. pain limited initially but ROM progressed with PROM. end range tightness and weakness    PT Treatment/Interventions ADLs/Self Care Home Management;Cryotherapy;Electrical Stimulation;Iontophoresis 4mg /ml Dexamethasone;Moist Heat;Ultrasound;Functional mobility training;Therapeutic activities;Therapeutic exercise;Patient/family education;Manual techniques;Passive range of motion;Dry needling;Energy conservation;Taping    PT Next Visit Plan continue to push strength and func ROM             Patient will benefit from skilled therapeutic intervention in order to improve the following deficits and impairments:  Decreased range of motion, Increased fascial restricitons, Impaired UE functional use, Decreased knowledge of precautions, Pain, Hypomobility, Impaired flexibility, Improper body mechanics, Decreased strength, Increased edema, Postural dysfunction  Visit Diagnosis: Stiffness of left shoulder, not elsewhere classified  Muscle weakness (generalized)  Abnormal posture     Problem List Patient Active Problem List   Diagnosis Date Noted   BCC (basal cell carcinoma of skin) 06/29/2019   Chronic leukopenia 09/24/2018   PCP NOTES >>>>>>>>>>>>>> 09/24/2018   Anxiety 09/24/2018   Insomnia 09/24/2018   History of atrial fibrillation 09/24/2018   Irregular heart rhythm 02/24/2016   Dysfunction of both eustachian tubes 01/18/2016   DJD (degenerative joint disease) 10/12/2013   Pars defect of lumbar spine     Trudy Kory,[name], PTA 03/20/2022, 2:44 PM  [place]. [place], [place], [address] Phone: [phone]   Fax:  [phone]  Name: Bruce Cobb MRN: [id number] Date of Birth: December 30, 1965

## 2022-03-22 ENCOUNTER — Ambulatory Visit: Payer: BC Managed Care – PPO | Admitting: Physical Therapy

## 2022-03-22 DIAGNOSIS — M6281 Muscle weakness (generalized): Secondary | ICD-10-CM

## 2022-03-22 DIAGNOSIS — R293 Abnormal posture: Secondary | ICD-10-CM | POA: Diagnosis not present

## 2022-03-22 DIAGNOSIS — M25612 Stiffness of left shoulder, not elsewhere classified: Secondary | ICD-10-CM

## 2022-03-22 NOTE — Therapy (Signed)
[place]. [place], [place], [address] Phone: [phone]   Fax:  [phone]  Physical Therapy Treatment  Patient Details  Name: Bruce Cobb MRN: [id number] Date of Birth: 1965/09/12 Referring Provider (PT): Ophelia Charter   Encounter Date: 03/22/2022   PT End of Session - 03/22/22 1226     Visit Number 20    Number of Visits 25    Date for PT Re-Evaluation 03/29/22    Authorization Type BCBS    Authorization Time Period 02/27/22 - 03/29/22    PT Start Time 1145    PT Stop Time 1230    PT Time Calculation (min) 45 min             Past Medical History:  Diagnosis Date   BCC (basal cell carcinoma of skin)    Hematospermia    hx of, remotely    Paroxysmal atrial fibrillation ([place]) 09/2017   saw cards   Pars defect of lumbar spine    PONV (postoperative nausea and vomiting)    Right knee DJD    Shingles 2014   face    Past Surgical History:  Procedure Laterality Date   ANKLE FRACTURE SURGERY Left 1996   plated   KNEE ARTHROSCOPY Left 7793   plica removed   KNEE ARTHROSCOPY Right    7 total   NASAL SEPTUM SURGERY     ORIF DISTAL RADIUS FRACTURE Right    age 77   REVISION TOTAL KNEE ARTHROPLASTY Right 08/19/2017   REVISION TOTAL SHOULDER TO REVERSE TOTAL SHOULDER Left 10/11/2021   Procedure: LEFT REVISION TOTAL SHOULDER TO REVERSE TOTAL SHOULDER, RIGHT SHOULDER STERIOD INJECTION;  Surgeon: Hiram Gash, MD;  [facility]: [facility];  Service: Orthopedics;  Laterality: Left;   SHOULDER ARTHROSCOPY W/ ROTATOR CUFF REPAIR Right 2011   bicep tendon and labrium repair   SHOULDER ARTHROSCOPY WITH ROTATOR CUFF REPAIR Left 2012   w/ labrium repair   TOTAL KNEE ARTHROPLASTY Right 10/12/2013   Procedure: RIGHT TOTAL KNEE ARTHROPLASTY;  Surgeon: Lorn Junes, MD;  Location: [facility];  Service: Orthopedics;  Laterality: Right;   VASECTOMY      There were no vitals filed for this visit.   Subjective Assessment -  03/22/22 1150     Subjective stiff but DN helped neck ROM    Currently in Pain? Yes    Pain Score 2     Pain Location Shoulder    Pain Orientation Right                [facility] PT Assessment - 03/22/22 0001       AROM   Left Shoulder Flexion 168 Degrees    Left Shoulder ABduction 170 Degrees    Left Shoulder Internal Rotation 70 Degrees    Left Shoulder External Rotation 80 Degrees                           [facility] Adult PT Treatment/Exercise - 03/22/22 0001       Shoulder Exercises: Pulleys   Other Pulley Exercises BIL and LUE cable pulleys      Shoulder Exercises: ROM/Strengthening   UBE (Upper Arm Bike) L 4 2 fwd/2 back    Lat Pull 20 reps   35#   Cybex Row 20 reps   35#     Manual Therapy   Manual Therapy Joint mobilization;Passive ROM    Joint Mobilization left shld,scap  Passive ROM all directions                       PT Short Term Goals - 12/19/21 1323       PT SHORT TERM GOAL #2   Title L shoulder AROM to be at least 160 degrees flexion and ABD, no more than 30 degrees ER AROM    Status Achieved      PT SHORT TERM GOAL #3   Title Will have better understanding of postural awareness and mechanics    Status Achieved      PT SHORT TERM GOAL #4   Title L shoulder strength to be at least 3 to 3+/5 in all tested groups    Status Achieved               PT Long Term Goals - 03/22/22 1227       PT LONG TERM GOAL #1   Title L shoulder MMT to be at least 4/5 in all muscle groups    Status Achieved      PT LONG TERM GOAL #2   Title Will be able to lift a 10# weight overhead with good mechanics and without pain in L shoulder    Status Partially Met      PT LONG TERM GOAL #3   Title Will be able to perform appropriate functional tasks at home and at the farm within limits of protocol/MD guidance without increase in shoulder pain    Status Achieved      PT LONG TERM GOAL #4   Status Achieved                    Plan - 03/22/22 1227     Clinical Impression Statement progressing strength and func with postural and compensatiry cuing. ROM improveing    PT Treatment/Interventions ADLs/Self Care Home Management;Cryotherapy;Electrical Stimulation;Iontophoresis 73m/ml Dexamethasone;Moist Heat;Ultrasound;Functional mobility training;Therapeutic activities;Therapeutic exercise;Patient/family education;Manual techniques;Passive range of motion;Dry needling;Energy conservation;Taping    PT Next Visit Plan continue to push strength and func ROM             Patient will benefit from skilled therapeutic intervention in order to improve the following deficits and impairments:  Decreased range of motion, Increased fascial restricitons, Impaired UE functional use, Decreased knowledge of precautions, Pain, Hypomobility, Impaired flexibility, Improper body mechanics, Decreased strength, Increased edema, Postural dysfunction  Visit Diagnosis: Stiffness of left shoulder, not elsewhere classified  Muscle weakness (generalized)  Abnormal posture     Problem List Patient Active Problem List   Diagnosis Date Noted   BCC (basal cell carcinoma of skin) 06/29/2019   Chronic leukopenia 09/24/2018   PCP NOTES >>>>>>>>>>>>>> 09/24/2018   Anxiety 09/24/2018   Insomnia 09/24/2018   History of atrial fibrillation 09/24/2018   Irregular heart rhythm 02/24/2016   Dysfunction of both eustachian tubes 01/18/2016   DJD (degenerative joint disease) 10/12/2013   Pars defect of lumbar spine     Jacquelene Kopecky,[name], PTA 03/22/2022, 12:29 PM  [place] [address]Phone: [phone]  Fax:  [phone] Name: Bruce PRYORMRN: [id number] of Birth: 9January 02, 1967

## 2022-03-27 ENCOUNTER — Ambulatory Visit: Payer: BC Managed Care – PPO | Admitting: Physical Therapy

## 2022-03-27 DIAGNOSIS — M25612 Stiffness of left shoulder, not elsewhere classified: Secondary | ICD-10-CM

## 2022-03-27 DIAGNOSIS — M6281 Muscle weakness (generalized): Secondary | ICD-10-CM

## 2022-03-27 DIAGNOSIS — R293 Abnormal posture: Secondary | ICD-10-CM | POA: Diagnosis not present

## 2022-03-27 NOTE — Therapy (Signed)
[place]. [place], [place], [address] Phone: [phone]   Fax:  [phone]  Physical Therapy Treatment  Patient Details  Name: Bruce Cobb MRN: [id number] Date of Birth: [birth year] Referring Provider (PT): Ophelia Charter   Encounter Date: 03/27/2022   PT End of Session - 03/27/22 1403     Visit Number 21    Date for PT Re-Evaluation 03/29/22    PT Start Time 1315    PT Stop Time 1400    PT Time Calculation (min) 45 min             Past Medical History:  Diagnosis Date   BCC (basal cell carcinoma of skin)    Hematospermia    hx of, remotely    Paroxysmal atrial fibrillation ([place]) 09/2017   saw cards   Pars defect of lumbar spine    PONV (postoperative nausea and vomiting)    Right knee DJD    Shingles 2014   face    Past Surgical History:  Procedure Laterality Date   ANKLE FRACTURE SURGERY Left 1996   plated   KNEE ARTHROSCOPY Left 6222   plica removed   KNEE ARTHROSCOPY Right    7 total   NASAL SEPTUM SURGERY     ORIF DISTAL RADIUS FRACTURE Right    age 17   REVISION TOTAL KNEE ARTHROPLASTY Right 08/19/2017   REVISION TOTAL SHOULDER TO REVERSE TOTAL SHOULDER Left 10/11/2021   Procedure: LEFT REVISION TOTAL SHOULDER TO REVERSE TOTAL SHOULDER, RIGHT SHOULDER STERIOD INJECTION;  Surgeon: Hiram Gash, MD;  Location: [facility];  Service: Orthopedics;  Laterality: Left;   SHOULDER ARTHROSCOPY W/ ROTATOR CUFF REPAIR Right 2011   bicep tendon and labrium repair   SHOULDER ARTHROSCOPY WITH ROTATOR CUFF REPAIR Left 2012   w/ labrium repair   TOTAL KNEE ARTHROPLASTY Right 10/12/2013   Procedure: RIGHT TOTAL KNEE ARTHROPLASTY;  Surgeon: Lorn Junes, MD;  Location: [facility];  Service: Orthopedics;  Laterality: Right;   VASECTOMY      There were no vitals filed for this visit.   Subjective Assessment - 03/27/22 1401     Subjective stiff as awlays    Currently in Pain? Yes    Pain Score 2     Pain  Location Shoulder                               [facility] Adult PT Treatment/Exercise - 03/27/22 0001       Shoulder Exercises: Prone   Other Prone Exercises 4# I,T,W   thumb, thumb down   Other Prone Exercises BOSU stab ex. midified push ups      Shoulder Exercises: Standing   Other Standing Exercises wt ball throwing 4 ways    Other Standing Exercises IR and ex with tacile and verb postural cuing   IR behind back     Shoulder Exercises: ROM/Strengthening   UBE (Upper Arm Bike) L 4 2 fwd/2 back      Manual Therapy   Manual Therapy Joint mobilization;Passive ROM    Joint Mobilization left shld,scap    Passive ROM all directions                       PT Short Term Goals - 12/19/21 1323       PT SHORT TERM GOAL #2   Title L shoulder AROM to be at least  160 degrees flexion and ABD, no more than 30 degrees ER AROM    Status Achieved      PT SHORT TERM GOAL #3   Title Will have better understanding of postural awareness and mechanics    Status Achieved      PT SHORT TERM GOAL #4   Title L shoulder strength to be at least 3 to 3+/5 in all tested groups    Status Achieved               PT Long Term Goals - 03/27/22 1403       PT LONG TERM GOAL #1   Title L shoulder MMT to be at least 4/5 in all muscle groups    Status Achieved      PT LONG TERM GOAL #2   Title Will be able to lift a 10# weight overhead with good mechanics and without pain in L shoulder    Status Partially Met      PT LONG TERM GOAL #3   Title Will be able to perform appropriate functional tasks at home and at the farm within limits of protocol/MD guidance without increase in shoulder pain    Status Achieved      PT LONG TERM GOAL #4   Title Will be able to perform L shoulder IR and extension to full range as appropriate within limits of protocol    Status Achieved                   Plan - 03/27/22 1404     Clinical Impression Statement pt continues  to be stiff and tight but does stretch out well, func weakness and fatigue noted as well as tacile and VCing for better postural and scap stab    PT Treatment/Interventions ADLs/Self Care Home Management;Cryotherapy;Electrical Stimulation;Iontophoresis 35m/ml Dexamethasone;Moist Heat;Ultrasound;Functional mobility training;Therapeutic activities;Therapeutic exercise;Patient/family education;Manual techniques;Passive range of motion;Dry needling;Energy conservation;Taping    PT Next Visit Plan continue to push strength and func ROM             Patient will benefit from skilled therapeutic intervention in order to improve the following deficits and impairments:  Decreased range of motion, Increased fascial restricitons, Impaired UE functional use, Decreased knowledge of precautions, Pain, Hypomobility, Impaired flexibility, Improper body mechanics, Decreased strength, Increased edema, Postural dysfunction  Visit Diagnosis: Stiffness of left shoulder, not elsewhere classified  Muscle weakness (generalized)     Problem List Patient Active Problem List   Diagnosis Date Noted   BCC (basal cell carcinoma of skin) 06/29/2019   Chronic leukopenia 09/24/2018   PCP NOTES >>>>>>>>>>>>>> 09/24/2018   Anxiety 09/24/2018   Insomnia 09/24/2018   History of atrial fibrillation 09/24/2018   Irregular heart rhythm 02/24/2016   Dysfunction of both eustachian tubes 01/18/2016   DJD (degenerative joint disease) 10/12/2013   Pars defect of lumbar spine     PAYSEUR,[name], PTA 03/27/2022, 2:05 PM  [place] [address]Phone: [phone]  Fax:  [phone] Name: Bruce BOWKERMRN: [id number] of Birth: [birth year]

## 2022-03-30 ENCOUNTER — Ambulatory Visit: Payer: BC Managed Care – PPO | Admitting: Physical Therapy

## 2022-04-03 ENCOUNTER — Ambulatory Visit: Payer: BC Managed Care – PPO | Attending: Orthopaedic Surgery | Admitting: Physical Therapy

## 2022-04-03 DIAGNOSIS — R293 Abnormal posture: Secondary | ICD-10-CM | POA: Insufficient documentation

## 2022-04-03 DIAGNOSIS — M25612 Stiffness of left shoulder, not elsewhere classified: Secondary | ICD-10-CM | POA: Insufficient documentation

## 2022-04-03 DIAGNOSIS — M6281 Muscle weakness (generalized): Secondary | ICD-10-CM | POA: Insufficient documentation

## 2022-04-03 DIAGNOSIS — M25512 Pain in left shoulder: Secondary | ICD-10-CM | POA: Diagnosis not present

## 2022-04-03 NOTE — Therapy (Signed)
[facility]. [place], [place], [address] Phone: [phone]   Fax:  [phone]  Physical Therapy Treatment   Patient Details  Name: Bruce Cobb MRN: [id number] Date of Birth: 06-26-1966 Referring Provider (PT): Ophelia Charter   Encounter Date: 04/03/2022   PT End of Session - 04/03/22 1230     Visit Number 22    Date for PT Re-Evaluation 05/03/22    PT Start Time 3299    PT Stop Time 1230    PT Time Calculation (min) 45 min             Past Medical History:  Diagnosis Date   BCC (basal cell carcinoma of skin)    Hematospermia    hx of, remotely    Paroxysmal atrial fibrillation ([facility] [facility]) 09/2017   saw cards   Pars defect of lumbar spine    PONV (postoperative nausea and vomiting)    Right knee DJD    Shingles 2014   face    Past Surgical History:  Procedure Laterality Date   ANKLE FRACTURE SURGERY Left 1996   plated   KNEE ARTHROSCOPY Left 2426   plica removed   KNEE ARTHROSCOPY Right    7 total   NASAL SEPTUM SURGERY     ORIF DISTAL RADIUS FRACTURE Right    age 63   REVISION TOTAL KNEE ARTHROPLASTY Right 08/19/2017   REVISION TOTAL SHOULDER TO REVERSE TOTAL SHOULDER Left 10/11/2021   Procedure: LEFT REVISION TOTAL SHOULDER TO REVERSE TOTAL SHOULDER, RIGHT SHOULDER STERIOD INJECTION;  Surgeon: Hiram Gash, MD;  Location[facility];  Service: Orthopedics;  Laterality: Left;   SHOULDER ARTHROSCOPY W/ ROTATOR CUFF REPAIR Right 2011   bicep tendon and labrium repair   SHOULDER ARTHROSCOPY WITH ROTATOR CUFF REPAIR Left 2012   w/ labrium repair   TOTAL KNEE ARTHROPLASTY Right 10/12/2013   Procedure: RIGHT TOTAL KNEE ARTHROPLASTY;  Surgeon: Lorn Junes, MD;  Location: [facility];  Service: Orthopedics;  Laterality: Right;   VASECTOMY      There were no vitals filed for this visit.   Subjective Assessment - 04/03/22 1147     Subjective stiff but i keep doing, rt shl dis getting realy bad    Currently in  Pain? Yes    Pain Score 2     Pain Location Shoulder    Pain Orientation Left    Pain Descriptors / Indicators Aching                               [facility] Adult PT Treatment/Exercise - 04/03/22 0001       Shoulder Exercises: Prone   Other Prone Exercises 5# I,T,W 10 x each    Other Prone Exercises plank taps 10 x      Shoulder Exercises: ROM/Strengthening   UBE (Upper Arm Bike) L 4 3 fwd/3 back    Lat Pull 15 reps   35# 2 sets   Cybex Press 20 reps   with serratus 35#   Cybex Row 15 reps   2 sets 15 35#                      PT Short Term Goals - 12/19/21 1323       PT SHORT TERM GOAL #2   Title L shoulder AROM to be at least 160 degrees flexion and ABD, no more than 30 degrees ER  AROM    Status Achieved      PT SHORT TERM GOAL #3   Title Will have better understanding of postural awareness and mechanics    Status Achieved      PT SHORT TERM GOAL #4   Title L shoulder strength to be at least 3 to 3+/5 in all tested groups    Status Achieved               PT Long Term Goals - 04/03/22 1233       PT LONG TERM GOAL #1   Title L shoulder MMT to be at least 4/5 in all muscle groups    Status Achieved      PT LONG TERM GOAL #2   Title Will be able to lift a 10# weight overhead with good mechanics and without pain in L shoulder    Status Partially Met      PT LONG TERM GOAL #3   Title Will be able to perform appropriate functional tasks at home and at the farm within limits of protocol/MD guidance without increase in shoulder pain    Status Achieved      PT LONG TERM GOAL #4   Title Will be able to perform L shoulder IR and extension to full range as appropriate within limits of protocol    Status Achieved                   Plan - 04/03/22 1230     Clinical Impression Statement RT shld really bothering pt and has inj for Thursday. lots of pain and snapping with ex. progressing strength and ROM. progressing with  goals. strength improving as well as ROM but does tighten up quickly    PT Treatment/Interventions ADLs/Self Care Home Management;Cryotherapy;Electrical Stimulation;Iontophoresis 4mg /ml Dexamethasone;Moist Heat;Ultrasound;Functional mobility training;Therapeutic activities;Therapeutic exercise;Patient/family education;Manual techniques;Passive range of motion;Dry needling;Energy conservation;Taping    PT Next Visit Plan Renewal done. Pt only has 6 more PT visits left for year             Patient will benefit from skilled therapeutic intervention in order to improve the following deficits and impairments:  Decreased range of motion, Increased fascial restricitons, Impaired UE functional use, Decreased knowledge of precautions, Pain, Hypomobility, Impaired flexibility, Improper body mechanics, Decreased strength, Increased edema, Postural dysfunction  Visit Diagnosis: Stiffness of left shoulder, not elsewhere classified  Muscle weakness (generalized)     Problem List Patient Active Problem List   Diagnosis Date Noted   BCC (basal cell carcinoma of skin) 06/29/2019   Chronic leukopenia 09/24/2018   PCP NOTES >>>>>>>>>>>>>> 09/24/2018   Anxiety 09/24/2018   Insomnia 09/24/2018   History of atrial fibrillation 09/24/2018   Irregular heart rhythm 02/24/2016   Dysfunction of both eustachian tubes 01/18/2016   DJD (degenerative joint disease) 10/12/2013   Pars defect of lumbar spine    Ethel Rana DPT 04/03/22 2:32 PM   Marcelina Morel, PT 04/03/2022, 2:32 PM  [facility]. [place], [place], [address] Phone: [phone]   Fax:  [phone]  Name: KOKI BUXTON MRN: [id number] Date of Birth: August 10, 1966

## 2022-04-05 ENCOUNTER — Ambulatory Visit: Payer: BC Managed Care – PPO | Admitting: Physical Therapy

## 2022-04-05 ENCOUNTER — Other Ambulatory Visit: Payer: Self-pay | Admitting: Orthopaedic Surgery

## 2022-04-05 DIAGNOSIS — M19012 Primary osteoarthritis, left shoulder: Secondary | ICD-10-CM | POA: Diagnosis not present

## 2022-04-05 DIAGNOSIS — S46001A Unspecified injury of muscle(s) and tendon(s) of the rotator cuff of right shoulder, initial encounter: Secondary | ICD-10-CM

## 2022-04-05 DIAGNOSIS — T84038A Mechanical loosening of other internal prosthetic joint, initial encounter: Secondary | ICD-10-CM

## 2022-04-10 ENCOUNTER — Ambulatory Visit: Payer: BC Managed Care – PPO | Admitting: Physical Therapy

## 2022-04-10 DIAGNOSIS — M6281 Muscle weakness (generalized): Secondary | ICD-10-CM | POA: Diagnosis not present

## 2022-04-10 DIAGNOSIS — M25612 Stiffness of left shoulder, not elsewhere classified: Secondary | ICD-10-CM

## 2022-04-10 DIAGNOSIS — R293 Abnormal posture: Secondary | ICD-10-CM | POA: Diagnosis not present

## 2022-04-10 DIAGNOSIS — M25512 Pain in left shoulder: Secondary | ICD-10-CM | POA: Diagnosis not present

## 2022-04-10 NOTE — Therapy (Signed)
[facility]. [place], [place], [address] Phone: [phone]   Fax:  [phone]  Physical Therapy Treatment  Patient Details  Name: Bruce Cobb MRN: [id number] Date of Birth: 02-Apr-1966 Referring Provider (PT): Ophelia Charter   Encounter Date: 04/10/2022    Past Medical History:  Diagnosis Date   BCC (basal cell carcinoma of skin)    Hematospermia    hx of, remotely    Paroxysmal atrial fibrillation ([place]) 09/2017   saw cards   Pars defect of lumbar spine    PONV (postoperative nausea and vomiting)    Right knee DJD    Shingles 2014   face    Past Surgical History:  Procedure Laterality Date   ANKLE FRACTURE SURGERY Left 1996   plated   KNEE ARTHROSCOPY Left 6962   plica removed   KNEE ARTHROSCOPY Right    7 total   NASAL SEPTUM SURGERY     ORIF DISTAL RADIUS FRACTURE Right    age 56   REVISION TOTAL KNEE ARTHROPLASTY Right 08/19/2017   REVISION TOTAL SHOULDER TO REVERSE TOTAL SHOULDER Left 10/11/2021   Procedure: LEFT REVISION TOTAL SHOULDER TO REVERSE TOTAL SHOULDER, RIGHT SHOULDER STERIOD INJECTION;  Surgeon: Hiram Gash, MD;  Location: [facility];  Service: Orthopedics;  Laterality: Left;   SHOULDER ARTHROSCOPY W/ ROTATOR CUFF REPAIR Right 2011   bicep tendon and labrium repair   SHOULDER ARTHROSCOPY WITH ROTATOR CUFF REPAIR Left 2012   w/ labrium repair   TOTAL KNEE ARTHROPLASTY Right 10/12/2013   Procedure: RIGHT TOTAL KNEE ARTHROPLASTY;  Surgeon: Lorn Junes, MD;  Location: [facility];  Service: Orthopedics;  Laterality: Right;   VASECTOMY      There were no vitals filed for this visit.   Subjective Assessment - 04/10/22 1320     Subjective saw MD and got cortisone in RT shld and MRO sunday. left is stif- just finished using chainsaw    Currently in Pain? Yes    Pain Score 2     Pain Location Shoulder    Pain Orientation Left                [facility] PT Assessment - 04/10/22 0001       AROM    Left Shoulder Flexion 175 Degrees    Left Shoulder ABduction 175 Degrees    Left Shoulder Internal Rotation 70 Degrees   left but cheek act Pass L 1   Left Shoulder External Rotation 85 Degrees                           [facility] Adult PT Treatment/Exercise - 04/10/22 0001       Shoulder Exercises: Supine   Flexion Strengthening;Left;12 reps   5#   Other Supine Exercises LUE ER/IR 5#  12x      Shoulder Exercises: Seated   Other Seated Exercises dips 2 sets 10      Shoulder Exercises: Prone   Other Prone Exercises plank up downs 5 x each UE   BOSU wt shifts and push ups     Shoulder Exercises: Sidelying   External Rotation Strengthening;Left;12 reps   5#   ABduction Strengthening;Left;12 reps   5#     Shoulder Exercises: Standing   Other Standing Exercises cable pulleys shld ext,row, chest press and flex 15 x each    Other Standing Exercises 8# shld ext and IR 15 x  Shoulder Exercises: ROM/Strengthening   UBE (Upper Arm Bike) L 6 2 min fwd/2 min back      Manual Therapy   Manual Therapy Passive ROM    Passive ROM all directions to end range                       PT Short Term Goals - 12/19/21 1323       PT SHORT TERM GOAL #2   Title L shoulder AROM to be at least 160 degrees flexion and ABD, no more than 30 degrees ER AROM    Status Achieved      PT SHORT TERM GOAL #3   Title Will have better understanding of postural awareness and mechanics    Status Achieved      PT SHORT TERM GOAL #4   Title L shoulder strength to be at least 3 to 3+/5 in all tested groups    Status Achieved               PT Long Term Goals - 04/10/22 1356       PT LONG TERM GOAL #1   Title L shoulder MMT to be at least 4/5 in all muscle groups    Status Achieved      PT LONG TERM GOAL #2   Title Will be able to lift a 10# weight overhead with good mechanics and without pain in L shoulder    Status Partially Met      PT LONG TERM GOAL #3   Title  Will be able to perform appropriate functional tasks at home and at the farm within limits of protocol/MD guidance without increase in shoulder pain    Status Achieved      PT LONG TERM GOAL #4   Title Will be able to perform L shoulder IR and extension to full range as appropriate within limits of protocol    Status Achieved                   Plan - 04/10/22 1400     Clinical Impression Statement ROM improving after MT but arrives stiff. pt very physically active- using chainsaw before session. weakness and fatigue esp with rotation    PT Treatment/Interventions ADLs/Self Care Home Management;Cryotherapy;Electrical Stimulation;Iontophoresis 4mg /ml Dexamethasone;Moist Heat;Ultrasound;Functional mobility training;Therapeutic activities;Therapeutic exercise;Patient/family education;Manual techniques;Passive range of motion;Dry needling;Energy conservation;Taping    PT Next Visit Plan moved therapy to 1 x a week to spread out visit limits             Patient will benefit from skilled therapeutic intervention in order to improve the following deficits and impairments:  Decreased range of motion, Increased fascial restricitons, Impaired UE functional use, Decreased knowledge of precautions, Pain, Hypomobility, Impaired flexibility, Improper body mechanics, Decreased strength, Increased edema, Postural dysfunction  Visit Diagnosis: Stiffness of left shoulder, not elsewhere classified  Muscle weakness (generalized)     Problem List Patient Active Problem List   Diagnosis Date Noted   BCC (basal cell carcinoma of skin) 06/29/2019   Chronic leukopenia 09/24/2018   PCP NOTES >>>>>>>>>>>>>> 09/24/2018   Anxiety 09/24/2018   Insomnia 09/24/2018   History of atrial fibrillation 09/24/2018   Irregular heart rhythm 02/24/2016   Dysfunction of both eustachian tubes 01/18/2016   DJD (degenerative joint disease) 10/12/2013   Pars defect of lumbar spine     Lyndsy Gilberto,[name],  PTA 04/10/2022, 2:02 PM  [facility]. [place],  [place], [address] Phone: [phone]   Fax:  [phone]  Name: Bruce Cobb MRN: [id number] Date of Birth: 09-11-1965

## 2022-04-12 ENCOUNTER — Ambulatory Visit: Payer: BC Managed Care – PPO | Admitting: Physical Therapy

## 2022-04-15 ENCOUNTER — Ambulatory Visit
Admission: RE | Admit: 2022-04-15 | Discharge: 2022-04-15 | Disposition: A | Discharge status: Home / Self Care | Payer: BC Managed Care – PPO | Source: Ambulatory Visit | Attending: Orthopaedic Surgery | Admitting: Orthopaedic Surgery

## 2022-04-15 DIAGNOSIS — S46001A Unspecified injury of muscle(s) and tendon(s) of the rotator cuff of right shoulder, initial encounter: Secondary | ICD-10-CM

## 2022-04-15 DIAGNOSIS — T84038A Mechanical loosening of other internal prosthetic joint, initial encounter: Secondary | ICD-10-CM

## 2022-04-15 DIAGNOSIS — M25511 Pain in right shoulder: Secondary | ICD-10-CM | POA: Diagnosis not present

## 2022-04-15 DIAGNOSIS — Z96619 Presence of unspecified artificial shoulder joint: Secondary | ICD-10-CM

## 2022-04-17 ENCOUNTER — Encounter: Payer: Self-pay | Admitting: Physical Therapy

## 2022-04-17 ENCOUNTER — Ambulatory Visit: Payer: BC Managed Care – PPO | Admitting: Physical Therapy

## 2022-04-17 DIAGNOSIS — M25512 Pain in left shoulder: Secondary | ICD-10-CM

## 2022-04-17 DIAGNOSIS — R293 Abnormal posture: Secondary | ICD-10-CM | POA: Diagnosis not present

## 2022-04-17 DIAGNOSIS — M25612 Stiffness of left shoulder, not elsewhere classified: Secondary | ICD-10-CM

## 2022-04-17 DIAGNOSIS — M6281 Muscle weakness (generalized): Secondary | ICD-10-CM | POA: Diagnosis not present

## 2022-04-17 NOTE — Therapy (Signed)
[facility]. [place], [place], [address] Phone: [phone]   Fax:  [phone]  Physical Therapy Treatment  Patient Details  Name: Bruce Cobb MRN: [id number] Date of Birth: [birth year] Referring Provider (PT): Ophelia Charter   Encounter Date: 04/17/2022   PT End of Session - 04/17/22 1427     Visit Number 23    Date for PT Re-Evaluation 05/03/22    Authorization Type BCBS    PT Start Time 1345    PT Stop Time 1428    PT Time Calculation (min) 43 min    Activity Tolerance Patient tolerated treatment well    Behavior During Therapy WFL for tasks assessed/performed             Past Medical History:  Diagnosis Date   BCC (basal cell carcinoma of skin)    Hematospermia    hx of, remotely    Paroxysmal atrial fibrillation ([facility]) 09/2017   saw cards   Pars defect of lumbar spine    PONV (postoperative nausea and vomiting)    Right knee DJD    Shingles 2014   face    Past Surgical History:  Procedure Laterality Date   ANKLE FRACTURE SURGERY Left 1996   plated   KNEE ARTHROSCOPY Left 5465   plica removed   KNEE ARTHROSCOPY Right    7 total   NASAL SEPTUM SURGERY     ORIF DISTAL RADIUS FRACTURE Right    age 35   REVISION TOTAL KNEE ARTHROPLASTY Right 08/19/2017   REVISION TOTAL SHOULDER TO REVERSE TOTAL SHOULDER Left 10/11/2021   Procedure: LEFT REVISION TOTAL SHOULDER TO REVERSE TOTAL SHOULDER, RIGHT SHOULDER STERIOD INJECTION;  Surgeon: Hiram Gash, MD;  Location[facility];  Service: Orthopedics;  Laterality: Left;   SHOULDER ARTHROSCOPY W/ ROTATOR CUFF REPAIR Right 2011   bicep tendon and labrium repair   SHOULDER ARTHROSCOPY WITH ROTATOR CUFF REPAIR Left 2012   w/ labrium repair   TOTAL KNEE ARTHROPLASTY Right 10/12/2013   Procedure: RIGHT TOTAL KNEE ARTHROPLASTY;  Surgeon: Lorn Junes, MD;  Location: [facility];  Service: Orthopedics;  Laterality: Right;   VASECTOMY      There were no vitals filed for  this visit.   Subjective Assessment - 04/17/22 1345     Subjective Gaining strength but lacking ROM. Not too bad today    Currently in Pain? No/denies                               [facility] Adult PT Treatment/Exercise - 04/17/22 0001       Shoulder Exercises: Standing   Internal Rotation Strengthening;Left;Theraband;15 reps   x2   Internal Rotation Weight (lbs) 5    Other Standing Exercises LUE ER ball toss abd 90 x10; Behind the back weighted ball passes, Triceps 65lb 2x10    Other Standing Exercises ER abd 90 green 2x10, 3 way scap stab green LUE x10 each, Abd flex combo 3lb x10      Shoulder Exercises: ROM/Strengthening   UBE (Upper Arm Bike) L 6 2 min fwd/2 min back    Other ROM/Strengthening Exercises Rows & Lats 45lb 2x15      Manual Therapy   Manual Therapy Passive ROM    Passive ROM all directions to end range  PT Short Term Goals - 12/19/21 1323       PT SHORT TERM GOAL #2   Title L shoulder AROM to be at least 160 degrees flexion and ABD, no more than 30 degrees ER AROM    Status Achieved      PT SHORT TERM GOAL #3   Title Will have better understanding of postural awareness and mechanics    Status Achieved      PT SHORT TERM GOAL #4   Title L shoulder strength to be at least 3 to 3+/5 in all tested groups    Status Achieved               PT Long Term Goals - 04/10/22 1356       PT LONG TERM GOAL #1   Title L shoulder MMT to be at least 4/5 in all muscle groups    Status Achieved      PT LONG TERM GOAL #2   Title Will be able to lift a 10# weight overhead with good mechanics and without pain in L shoulder    Status Partially Met      PT LONG TERM GOAL #3   Title Will be able to perform appropriate functional tasks at home and at the farm within limits of protocol/MD guidance without increase in shoulder pain    Status Achieved      PT LONG TERM GOAL #4   Title Will be able to perform L  shoulder IR and extension to full range as appropriate within limits of protocol    Status Achieved                   Plan - 04/17/22 1428     Clinical Impression Statement Pt enters feeling well overall. Progressed with R shoulder strength with motions at and above shoulder height. He continues to fatigue quick with rotational movements. Positive response to PROM with eng range holds.    Personal Factors and Comorbidities Past/Current Experience;Social Background;Time since onset of injury/illness/exacerbation    Examination-Activity Limitations Bathing;Bed Mobility;Reach Overhead;Caring for Others;Carry;Dressing;Hygiene/Grooming;Lift    Examination-Participation Restrictions Occupation;Cleaning;Community Activity;Driving;Shop;Yard Work    Rehab Potential Good    PT Frequency 2x / week    PT Treatment/Interventions ADLs/Self Care Home Management;Cryotherapy;Electrical Stimulation;Iontophoresis 4mg /ml Dexamethasone;Moist Heat;Ultrasound;Functional mobility training;Therapeutic activities;Therapeutic exercise;Patient/family education;Manual techniques;Passive range of motion;Dry needling;Energy conservation;Taping    PT Next Visit Plan moved therapy to 1 x a week to spread out visit limits             Patient will benefit from skilled therapeutic intervention in order to improve the following deficits and impairments:  Decreased range of motion, Increased fascial restricitons, Impaired UE functional use, Decreased knowledge of precautions, Pain, Hypomobility, Impaired flexibility, Improper body mechanics, Decreased strength, Increased edema, Postural dysfunction  Visit Diagnosis: Stiffness of left shoulder, not elsewhere classified  Muscle weakness (generalized)  Abnormal posture  Acute pain of left shoulder     Problem List Patient Active Problem List   Diagnosis Date Noted   BCC (basal cell carcinoma of skin) 06/29/2019   Chronic leukopenia 09/24/2018   PCP NOTES  >>>>>>>>>>>>>> 09/24/2018   Anxiety 09/24/2018   Insomnia 09/24/2018   History of atrial fibrillation 09/24/2018   Irregular heart rhythm 02/24/2016   Dysfunction of both eustachian tubes 01/18/2016   DJD (degenerative joint disease) 10/12/2013   Pars defect of lumbar spine     Scot Jun, PTA 04/17/2022, 2:31 PM  [facility]. [place], [place], [address] Phone: [phone]   Fax:  [phone]  Name: Bruce Cobb MRN: [id number] Date of Birth: 15-Apr-1966

## 2022-04-24 ENCOUNTER — Other Ambulatory Visit: Payer: Self-pay | Admitting: Orthopaedic Surgery

## 2022-04-24 DIAGNOSIS — Z01818 Encounter for other preprocedural examination: Secondary | ICD-10-CM

## 2022-05-01 ENCOUNTER — Ambulatory Visit: Payer: BC Managed Care – PPO | Admitting: Physical Therapy

## 2022-05-10 ENCOUNTER — Ambulatory Visit: Payer: BC Managed Care – PPO | Attending: Orthopaedic Surgery | Admitting: Physical Therapy

## 2022-05-10 DIAGNOSIS — M6281 Muscle weakness (generalized): Secondary | ICD-10-CM | POA: Insufficient documentation

## 2022-05-10 DIAGNOSIS — M25612 Stiffness of left shoulder, not elsewhere classified: Secondary | ICD-10-CM | POA: Insufficient documentation

## 2022-05-10 NOTE — Therapy (Signed)
[facility]. [place], [place], [address] Phone: [phone]   Fax:  [phone]  Physical Therapy Treatment  Patient Details  Name: Bruce Cobb MRN: [id number] Date of Birth: 1965/11/27 Referring Provider (PT): Ophelia Charter   Encounter Date: 05/10/2022   PT End of Session - 05/10/22 1640     Visit Number 24    Date for PT Re-Evaluation 05/03/22    Authorization Type BCBS    PT Start Time 1620    PT Stop Time 1700    PT Time Calculation (min) 40 min             Past Medical History:  Diagnosis Date   BCC (basal cell carcinoma of skin)    Hematospermia    hx of, remotely    Paroxysmal atrial fibrillation ([facility]) 09/2017   saw cards   Pars defect of lumbar spine    PONV (postoperative nausea and vomiting)    Right knee DJD    Shingles 2014   face    Past Surgical History:  Procedure Laterality Date   ANKLE FRACTURE SURGERY Left 1996   plated   KNEE ARTHROSCOPY Left 6389   plica removed   KNEE ARTHROSCOPY Right    7 total   NASAL SEPTUM SURGERY     ORIF DISTAL RADIUS FRACTURE Right    age 56   REVISION TOTAL KNEE ARTHROPLASTY Right 08/19/2017   REVISION TOTAL SHOULDER TO REVERSE TOTAL SHOULDER Left 10/11/2021   Procedure: LEFT REVISION TOTAL SHOULDER TO REVERSE TOTAL SHOULDER, RIGHT SHOULDER STERIOD INJECTION;  Surgeon: Hiram Gash, MD;  Location: [facility];  Service: Orthopedics;  Laterality: Left;   SHOULDER ARTHROSCOPY W/ ROTATOR CUFF REPAIR Right 2011   bicep tendon and labrium repair   SHOULDER ARTHROSCOPY WITH ROTATOR CUFF REPAIR Left 2012   w/ labrium repair   TOTAL KNEE ARTHROPLASTY Right 10/12/2013   Procedure: RIGHT TOTAL KNEE ARTHROPLASTY;  Surgeon: Lorn Junes, MD;  Location: [facility];  Service: Orthopedics;  Laterality: Right;   VASECTOMY      There were no vitals filed for this visit.   Subjective Assessment - 05/10/22 1641     Subjective stiff. RT shld surgery Dec    Currently in  Pain? Yes    Pain Score 1     Pain Location Shoulder    Pain Orientation Left                [facility] PT Assessment - 05/10/22 0001       AROM   Left Shoulder Flexion 175 Degrees    Left Shoulder ABduction 178 Degrees    Left Shoulder Internal Rotation 72 Degrees    Left Shoulder External Rotation 85 Degrees                           [facility] Adult PT Treatment/Exercise - 05/10/22 0001       Shoulder Exercises: Standing   Other Standing Exercises 7# flex,abd OH , chest press , ER    Other Standing Exercises cabel pulley strengtheining      Manual Therapy   Manual Therapy Joint mobilization;Soft tissue mobilization;Passive ROM    Joint Mobilization belt mobs    Soft tissue mobilization left shld    Passive ROM all directions to end range                       PT  Short Term Goals - 12/19/21 1323       PT SHORT TERM GOAL #2   Title L shoulder AROM to be at least 160 degrees flexion and ABD, no more than 30 degrees ER AROM    Status Achieved      PT SHORT TERM GOAL #3   Title Will have better understanding of postural awareness and mechanics    Status Achieved      PT SHORT TERM GOAL #4   Title L shoulder strength to be at least 3 to 3+/5 in all tested groups    Status Achieved               PT Long Term Goals - 05/10/22 1654       PT LONG TERM GOAL #1   Title L shoulder MMT to be at least 4/5 in all muscle groups    Status Achieved      PT LONG TERM GOAL #2   Title Will be able to lift a 10# weight overhead with good mechanics and without pain in L shoulder    Status Partially Met      PT LONG TERM GOAL #3   Title Will be able to perform appropriate functional tasks at home and at the farm within limits of protocol/MD guidance without increase in shoulder pain    Status Achieved      PT LONG TERM GOAL #4   Title Will be able to perform L shoulder IR and extension to full range as appropriate within limits of protocol     Status Achieved      PT LONG TERM GOAL #5   Title Patient will report 50% stiffness or better after activities that involve upper extremities.    Status New                   Plan - 05/10/22 1656     Clinical Impression Statement pt arrives stating RT shld will be replaced in Dec. Tryin to spread out visist as they are limited. tightness noted but overall ROM good. tenderness ant shdl and joint capsule tightness    PT Treatment/Interventions ADLs/Self Care Home Management;Cryotherapy;Electrical Stimulation;Iontophoresis 13m/ml Dexamethasone;Moist Heat;Ultrasound;Functional mobility training;Therapeutic activities;Therapeutic exercise;Patient/family education;Manual techniques;Passive range of motion;Dry needling;Energy conservation;Taping    PT Next Visit Plan 1x every week or every other to spead out visits             Patient will benefit from skilled therapeutic intervention in order to improve the following deficits and impairments:  Decreased range of motion, Increased fascial restricitons, Impaired UE functional use, Decreased knowledge of precautions, Pain, Hypomobility, Impaired flexibility, Improper body mechanics, Decreased strength, Increased edema, Postural dysfunction  Visit Diagnosis: Stiffness of left shoulder, not elsewhere classified  Muscle weakness (generalized)     Problem List Patient Active Problem List   Diagnosis Date Noted   BCC (basal cell carcinoma of skin) 06/29/2019   Chronic leukopenia 09/24/2018   PCP NOTES >>>>>>>>>>>>>> 09/24/2018   Anxiety 09/24/2018   Insomnia 09/24/2018   History of atrial fibrillation 09/24/2018   Irregular heart rhythm 02/24/2016   Dysfunction of both eustachian tubes 01/18/2016   DJD (degenerative joint disease) 10/12/2013   Pars defect of lumbar spine     MAndris Baumann PT 05/10/2022, 5:10 PM  [place] [address]Phone:  [phone]  Fax:  [phone] Name: SJOSHUA SOULIERMRN: [id number] of Birth: [birth year]

## 2022-05-10 NOTE — Therapy (Deleted)
[facility]. [place], [place], [address] Phone: [phone]   Fax:  [phone]  Physical Therapy Treatment  Patient Details  Name: Bruce Cobb MRN: [id number] Date of Birth: 07-18-1966 Referring Provider (PT): Ophelia Charter   Encounter Date: 05/10/2022   PT End of Session - 05/10/22 1640     Visit Number 24    Date for PT Re-Evaluation 05/03/22    Authorization Type BCBS    PT Start Time 1620    PT Stop Time 1700    PT Time Calculation (min) 40 min             Past Medical History:  Diagnosis Date   BCC (basal cell carcinoma of skin)    Hematospermia    hx of, remotely    Paroxysmal atrial fibrillation ([place]) 09/2017   saw cards   Pars defect of lumbar spine    PONV (postoperative nausea and vomiting)    Right knee DJD    Shingles 2014   face    Past Surgical History:  Procedure Laterality Date   ANKLE FRACTURE SURGERY Left 1996   plated   KNEE ARTHROSCOPY Left 3532   plica removed   KNEE ARTHROSCOPY Right    7 total   NASAL SEPTUM SURGERY     ORIF DISTAL RADIUS FRACTURE Right    age 62   REVISION TOTAL KNEE ARTHROPLASTY Right 08/19/2017   REVISION TOTAL SHOULDER TO REVERSE TOTAL SHOULDER Left 10/11/2021   Procedure: LEFT REVISION TOTAL SHOULDER TO REVERSE TOTAL SHOULDER, RIGHT SHOULDER STERIOD INJECTION;  Surgeon: Hiram Gash, MD;  Location: [facility];  Service: Orthopedics;  Laterality: Left;   SHOULDER ARTHROSCOPY W/ ROTATOR CUFF REPAIR Right 2011   bicep tendon and labrium repair   SHOULDER ARTHROSCOPY WITH ROTATOR CUFF REPAIR Left 2012   w/ labrium repair   TOTAL KNEE ARTHROPLASTY Right 10/12/2013   Procedure: RIGHT TOTAL KNEE ARTHROPLASTY;  Surgeon: Lorn Junes, MD;  Location: [facility];  Service: Orthopedics;  Laterality: Right;   VASECTOMY      There were no vitals filed for this visit.   Subjective Assessment - 04/17/22 1345     Subjective Gaining strength but lacking ROM. Not too bad  today    Currently in Pain? No/denies                                           PT Short Term Goals - 12/19/21 1323       PT SHORT TERM GOAL #2   Title L shoulder AROM to be at least 160 degrees flexion and ABD, no more than 30 degrees ER AROM    Status Achieved      PT SHORT TERM GOAL #3   Title Will have better understanding of postural awareness and mechanics    Status Achieved      PT SHORT TERM GOAL #4   Title L shoulder strength to be at least 3 to 3+/5 in all tested groups    Status Achieved               PT Long Term Goals - 04/10/22 1356       PT LONG TERM GOAL #1   Title L shoulder MMT to be at least 4/5 in all muscle groups    Status Achieved      PT  LONG TERM GOAL #2   Title Will be able to lift a 10# weight overhead with good mechanics and without pain in L shoulder    Status Partially Met      PT LONG TERM GOAL #3   Title Will be able to perform appropriate functional tasks at home and at the farm within limits of protocol/MD guidance without increase in shoulder pain    Status Achieved      PT LONG TERM GOAL #4   Title Will be able to perform L shoulder IR and extension to full range as appropriate within limits of protocol    Status Achieved                   Plan - 04/17/22 1428     Clinical Impression Statement Pt enters feeling well overall. Progressed with R shoulder strength with motions at and above shoulder height. He continues to fatigue quick with rotational movements. Positive response to PROM with eng range holds.    Personal Factors and Comorbidities Past/Current Experience;Social Background;Time since onset of injury/illness/exacerbation    Examination-Activity Limitations Bathing;Bed Mobility;Reach Overhead;Caring for Others;Carry;Dressing;Hygiene/Grooming;Lift    Examination-Participation Restrictions Occupation;Cleaning;Community Activity;Driving;Shop;Yard Work    Rehab Potential Good     PT Frequency 2x / week    PT Treatment/Interventions ADLs/Self Care Home Management;Cryotherapy;Electrical Stimulation;Iontophoresis 4mg /ml Dexamethasone;Moist Heat;Ultrasound;Functional mobility training;Therapeutic activities;Therapeutic exercise;Patient/family education;Manual techniques;Passive range of motion;Dry needling;Energy conservation;Taping    PT Next Visit Plan moved therapy to 1 x a week to spread out visit limits             Patient will benefit from skilled therapeutic intervention in order to improve the following deficits and impairments:     Visit Diagnosis: Stiffness of left shoulder, not elsewhere classified  Muscle weakness (generalized)     Problem List Patient Active Problem List   Diagnosis Date Noted   BCC (basal cell carcinoma of skin) 06/29/2019   Chronic leukopenia 09/24/2018   PCP NOTES >>>>>>>>>>>>>> 09/24/2018   Anxiety 09/24/2018   Insomnia 09/24/2018   History of atrial fibrillation 09/24/2018   Irregular heart rhythm 02/24/2016   Dysfunction of both eustachian tubes 01/18/2016   DJD (degenerative joint disease) 10/12/2013   Pars defect of lumbar spine     Bradyn Soward,[name], PTA 05/10/2022, 4:41 PM  [facility]. [place], [place], [address] Phone: [phone]   Fax:  [phone]  Name: Bruce Cobb MRN: [id number] Date of Birth: 02-04-1966  [facility]. [place], [place], [address] Phone: [phone]   Fax:  [phone]  Patient Details  Name: Bruce Cobb MRN: [id number] Date of Birth: 05-20-1966 Referring Provider:  No ref. provider found  Encounter Date: 05/10/2022   Vickii Penna 05/10/2022, 4:41 PM  [facility]. [place], [place], [address] Phone: [phone]   Fax:  [phone]

## 2022-05-22 ENCOUNTER — Ambulatory Visit: Payer: BC Managed Care – PPO | Admitting: Physical Therapy

## 2022-05-22 DIAGNOSIS — M25612 Stiffness of left shoulder, not elsewhere classified: Secondary | ICD-10-CM | POA: Diagnosis not present

## 2022-05-22 DIAGNOSIS — M6281 Muscle weakness (generalized): Secondary | ICD-10-CM | POA: Diagnosis not present

## 2022-05-22 NOTE — Therapy (Signed)
[facility]. [place], [place], [address] Phone: [phone]   Fax:  [phone]  Physical Therapy Treatment  Patient Details  Name: Bruce Cobb MRN: [id number] Date of Birth: March 16, 1966 Referring Provider (PT): Ophelia Charter   Encounter Date: 05/22/2022   PT End of Session - 05/22/22 1141     Visit Number 25    Authorization Type BCBS    PT Start Time 1100    PT Stop Time 1140    PT Time Calculation (min) 40 min             Past Medical History:  Diagnosis Date   BCC (basal cell carcinoma of skin)    Hematospermia    hx of, remotely    Paroxysmal atrial fibrillation ([facility]) 09/2017   saw cards   Pars defect of lumbar spine    PONV (postoperative nausea and vomiting)    Right knee DJD    Shingles 2014   face    Past Surgical History:  Procedure Laterality Date   ANKLE FRACTURE SURGERY Left 1996   plated   KNEE ARTHROSCOPY Left 8921   plica removed   KNEE ARTHROSCOPY Right    7 total   NASAL SEPTUM SURGERY     ORIF DISTAL RADIUS FRACTURE Right    age 65   REVISION TOTAL KNEE ARTHROPLASTY Right 08/19/2017   REVISION TOTAL SHOULDER TO REVERSE TOTAL SHOULDER Left 10/11/2021   Procedure: LEFT REVISION TOTAL SHOULDER TO REVERSE TOTAL SHOULDER, RIGHT SHOULDER STERIOD INJECTION;  Surgeon: Hiram Gash, MD;  Location: [facility];  Service: Orthopedics;  Laterality: Left;   SHOULDER ARTHROSCOPY W/ ROTATOR CUFF REPAIR Right 2011   bicep tendon and labrium repair   SHOULDER ARTHROSCOPY WITH ROTATOR CUFF REPAIR Left 2012   w/ labrium repair   TOTAL KNEE ARTHROPLASTY Right 10/12/2013   Procedure: RIGHT TOTAL KNEE ARTHROPLASTY;  Surgeon: Lorn Junes, MD;  Location: [facility];  Service: Orthopedics;  Laterality: Right;   VASECTOMY      There were no vitals filed for this visit.   Subjective Assessment - 05/22/22 1103     Subjective moved up right shld surgery to monday after Thanksgiving and RT shld shot today  -everything is so tight    Currently in Pain? Yes    Pain Score 2     Pain Location Shoulder                               [facility] Adult PT Treatment/Exercise - 05/22/22 0001       Shoulder Exercises: Supine   Other Supine Exercises left shld 7# supine ex      Shoulder Exercises: Standing   Other Standing Exercises UE circuit      Shoulder Exercises: ROM/Strengthening   UBE (Upper Arm Bike) L 6 3 min fwd/3 min back      Manual Therapy   Manual Therapy Soft tissue mobilization;Passive ROM    Joint Mobilization left shld    Soft tissue mobilization left shdl    Passive ROM cerv and BIL shld                       PT Short Term Goals - 12/19/21 1323       PT SHORT TERM GOAL #2   Title L shoulder AROM to be at least 160 degrees flexion and ABD, no more than 30  degrees ER AROM    Status Achieved      PT SHORT TERM GOAL #3   Title Will have better understanding of postural awareness and mechanics    Status Achieved      PT SHORT TERM GOAL #4   Title L shoulder strength to be at least 3 to 3+/5 in all tested groups    Status Achieved               PT Long Term Goals - 05/22/22 1147       PT LONG TERM GOAL #1   Title L shoulder MMT to be at least 4/5 in all muscle groups    Status Achieved      PT LONG TERM GOAL #2   Title Will be able to lift a 10# weight overhead with good mechanics and without pain in L shoulder    Status Partially Met      PT LONG TERM GOAL #3   Title Will be able to perform appropriate functional tasks at home and at the farm within limits of protocol/MD guidance without increase in shoulder pain    Status Achieved      PT LONG TERM GOAL #4   Title Will be able to perform L shoulder IR and extension to full range as appropriate within limits of protocol    Status Achieved      PT LONG TERM GOAL #5   Title Patient will report 50% stiffness or better after activities that involve upper extremities.     Status Achieved                   Plan - 05/22/22 1141     Clinical Impression Statement pt arrived stating getting injection today and moved surgery up, states RT shld pain trumps everything and both shld and neck are tight. Left shld end range tightness but overall is moving well and progressing. only 2 visits left and may hold until RT shld surgery late nov    PT Next Visit Plan ? hold to save last 2 visits for after surgery- pt to discuss with MD             Patient will benefit from skilled therapeutic intervention in order to improve the following deficits and impairments:     Visit Diagnosis: Stiffness of left shoulder, not elsewhere classified  Muscle weakness (generalized)     Problem List Patient Active Problem List   Diagnosis Date Noted   BCC (basal cell carcinoma of skin) 06/29/2019   Chronic leukopenia 09/24/2018   PCP NOTES >>>>>>>>>>>>>> 09/24/2018   Anxiety 09/24/2018   Insomnia 09/24/2018   History of atrial fibrillation 09/24/2018   Irregular heart rhythm 02/24/2016   Dysfunction of both eustachian tubes 01/18/2016   DJD (degenerative joint disease) 10/12/2013   Pars defect of lumbar spine     Kaydence Baba,[name], PTA 05/22/2022, 11:48 AM  [facility]. [place], [place], [address] Phone: [phone]   Fax:  [phone]  Name: Bruce Cobb MRN: [id number] Date of Birth: Sep 20, 1965

## 2022-06-07 ENCOUNTER — Ambulatory Visit: Payer: BC Managed Care – PPO | Attending: Orthopaedic Surgery | Admitting: Physical Therapy

## 2022-06-07 DIAGNOSIS — L812 Freckles: Secondary | ICD-10-CM | POA: Diagnosis not present

## 2022-06-07 DIAGNOSIS — L570 Actinic keratosis: Secondary | ICD-10-CM | POA: Diagnosis not present

## 2022-06-07 DIAGNOSIS — M6281 Muscle weakness (generalized): Secondary | ICD-10-CM | POA: Insufficient documentation

## 2022-06-07 DIAGNOSIS — L821 Other seborrheic keratosis: Secondary | ICD-10-CM | POA: Diagnosis not present

## 2022-06-07 DIAGNOSIS — M25612 Stiffness of left shoulder, not elsewhere classified: Secondary | ICD-10-CM | POA: Diagnosis not present

## 2022-06-07 DIAGNOSIS — Z85828 Personal history of other malignant neoplasm of skin: Secondary | ICD-10-CM | POA: Diagnosis not present

## 2022-06-07 DIAGNOSIS — B078 Other viral warts: Secondary | ICD-10-CM | POA: Diagnosis not present

## 2022-06-07 DIAGNOSIS — C4441 Basal cell carcinoma of skin of scalp and neck: Secondary | ICD-10-CM | POA: Diagnosis not present

## 2022-06-07 NOTE — Therapy (Signed)
[facility]. [place], [place], [address] Phone: [phone]   Fax:  [phone]  Physical Therapy Treatment  PHYSICAL THERAPY DISCHARGE SUMMARY  Visits from Start of Care: 26  Current functional level related to goals / functional outcomes: Progressed to meet most of his goals, R shoulder pain is limiting.   Remaining deficits: R shoulder pain and weakness   Education / Equipment: HEP   Patient agrees to discharge. Patient goals were partially met. Patient is being discharged due to maximized rehab potential.    Patient Details  Name: Bruce Cobb MRN: [id number] Date of Birth: 03-Apr-1966 Referring Provider (PT): Ophelia Charter   Encounter Date: 06/07/2022   PT End of Session - 06/07/22 1357     Visit Number 26    Date for PT Re-Evaluation 05/03/22    Authorization Type BCBS    PT Start Time 1400    PT Stop Time 4235    PT Time Calculation (min) 45 min             Past Medical History:  Diagnosis Date   BCC (basal cell carcinoma of skin)    Hematospermia    hx of, remotely    Paroxysmal atrial fibrillation ([facility]) 09/2017   saw cards   Pars defect of lumbar spine    PONV (postoperative nausea and vomiting)    Right knee DJD    Shingles 2014   face    Past Surgical History:  Procedure Laterality Date   ANKLE FRACTURE SURGERY Left 1996   plated   KNEE ARTHROSCOPY Left 3614   plica removed   KNEE ARTHROSCOPY Right    7 total   NASAL SEPTUM SURGERY     ORIF DISTAL RADIUS FRACTURE Right    age 2   REVISION TOTAL KNEE ARTHROPLASTY Right 08/19/2017   REVISION TOTAL SHOULDER TO REVERSE TOTAL SHOULDER Left 10/11/2021   Procedure: LEFT REVISION TOTAL SHOULDER TO REVERSE TOTAL SHOULDER, RIGHT SHOULDER STERIOD INJECTION;  Surgeon: Hiram Gash, MD;  Location: [facility];  Service: Orthopedics;  Laterality: Left;   SHOULDER ARTHROSCOPY W/ ROTATOR CUFF REPAIR Right 2011   bicep tendon and labrium repair    SHOULDER ARTHROSCOPY WITH ROTATOR CUFF REPAIR Left 2012   w/ labrium repair   TOTAL KNEE ARTHROPLASTY Right 10/12/2013   Procedure: RIGHT TOTAL KNEE ARTHROPLASTY;  Surgeon: Lorn Junes, MD;  Location: [facility];  Service: Orthopedics;  Laterality: Right;   VASECTOMY      There were no vitals filed for this visit.   Subjective Assessment - 06/07/22 1358     Subjective pt arrives with script for RT shld. left shld about the same    Currently in Pain? Yes    Pain Score 5     Pain Location Shoulder    Pain Orientation Right;Left    Pain Descriptors / Indicators Aching;Throbbing                [facility] PT Assessment - 06/07/22 0001       AROM   Right/Left Shoulder Right    Right Shoulder Flexion 150 Degrees   pain   Right Shoulder ABduction 110 Degrees   painarc and catch   Right Shoulder Internal Rotation 60 Degrees   pain   Right Shoulder External Rotation 90 Degrees   painful arc   Left Shoulder Flexion 175 Degrees    Left Shoulder ABduction 178 Degrees    Left Shoulder Internal Rotation 70 Degrees  Left Shoulder External Rotation 80 Degrees      Strength   Overall Strength Comments left shld WFLS. RT flex and abd 4/5, rotations 3+ with pain                           [facility] Adult PT Treatment/Exercise - 06/07/22 0001       Shoulder Exercises: Seated   Other Seated Exercises seated circuit      Shoulder Exercises: Standing   Other Standing Exercises UE circuit      Shoulder Exercises: ROM/Strengthening   UBE (Upper Arm Bike) L 6 3 min fwd/3 min back    Other ROM/Strengthening Exercises Rows & Lats 55lb 2x15    Other ROM/Strengthening Exercises 45# chest press with serratus 2 sets 15      Manual Therapy   Passive ROM BIL shlds                       PT Short Term Goals - 12/19/21 1323       PT SHORT TERM GOAL #2   Title L shoulder AROM to be at least 160 degrees flexion and ABD, no more than 30 degrees ER AROM    Status Achieved       PT SHORT TERM GOAL #3   Title Will have better understanding of postural awareness and mechanics    Status Achieved      PT SHORT TERM GOAL #4   Title L shoulder strength to be at least 3 to 3+/5 in all tested groups    Status Achieved               PT Long Term Goals - 06/07/22 1409       PT LONG TERM GOAL #1   Title L shoulder MMT to be at least 4/5 in all muscle groups    Status Achieved      PT LONG TERM GOAL #2   Title Will be able to lift a 10# weight overhead with good mechanics and without pain in L shoulder    Baseline limited BIL but pain mostly pain in RT    Status Partially Met      PT LONG TERM GOAL #3   Title Will be able to perform appropriate functional tasks at home and at the farm within limits of protocol/MD guidance without increase in shoulder pain    Status Achieved      PT LONG TERM GOAL #4   Title Will be able to perform L shoulder IR and extension to full range as appropriate within limits of protocol    Status Achieved      PT LONG TERM GOAL #5   Title Patient will report 50% stiffness or better after activities that involve upper extremities.    Status Achieved                   Plan - 06/07/22 1409     Clinical Impression Statement goals met except lifting that is more limited by RT shld . pt has painful arc on RT with multi points of "catching" causing severe pain and has to stop actvity and at  times arm just drops. pt has had 29/30 PT session with strengtheing and ROM BIL shld, Rt is significantly more limited with pain and weakness and compensation without seeing benefits with PT    PT Treatment/Interventions ADLs/Self Care Home Management;Cryotherapy;Electrical Stimulation;Iontophoresis 4mg /ml Dexamethasone;Moist Heat;Ultrasound;Functional mobility training;Therapeutic  activities;Therapeutic exercise;Patient/family education;Manual techniques;Passive range of motion;Dry needling;Energy conservation;Taping    PT Next Visit  Plan D/C             Patient will benefit from skilled therapeutic intervention in order to improve the following deficits and impairments:  Decreased range of motion, Increased fascial restricitons, Impaired UE functional use, Decreased knowledge of precautions, Pain, Hypomobility, Impaired flexibility, Improper body mechanics, Decreased strength, Increased edema, Postural dysfunction  Visit Diagnosis: Stiffness of left shoulder, not elsewhere classified  Muscle weakness (generalized)     Problem List Patient Active Problem List   Diagnosis Date Noted   BCC (basal cell carcinoma of skin) 06/29/2019   Chronic leukopenia 09/24/2018   PCP NOTES >>>>>>>>>>>>>> 09/24/2018   Anxiety 09/24/2018   Insomnia 09/24/2018   History of atrial fibrillation 09/24/2018   Irregular heart rhythm 02/24/2016   Dysfunction of both eustachian tubes 01/18/2016   DJD (degenerative joint disease) 10/12/2013   Pars defect of lumbar spine    Ethel Rana DPT 06/07/22 2:43 PM   Besan Ketchem,[name], PTA 06/07/2022, 2:38 PM  [facility]. [place], [place], [address] Phone: [phone]   Fax:  [phone]  Name: Bruce Cobb MRN: [id number] Date of Birth: November 27, 1965

## 2022-06-07 NOTE — Therapy (Deleted)
[facility]. [place], [place], [address] Phone: [phone]   Fax:  [phone]  Physical Therapy Treatment  Patient Details  Name: Bruce Cobb MRN: [id number] Date of Birth: 25-Feb-1966 Referring Provider (PT): Ophelia Charter   Encounter Date: 06/07/2022   PT End of Session - 06/07/22 1357     Visit Number 26    Date for PT Re-Evaluation 05/03/22    Authorization Type BCBS    PT Start Time 1400    PT Stop Time 2563    PT Time Calculation (min) 45 min             Past Medical History:  Diagnosis Date   BCC (basal cell carcinoma of skin)    Hematospermia    hx of, remotely    Paroxysmal atrial fibrillation ([facility] [place]) 09/2017   saw cards   Pars defect of lumbar spine    PONV (postoperative nausea and vomiting)    Right knee DJD    Shingles 2014   face    Past Surgical History:  Procedure Laterality Date   ANKLE FRACTURE SURGERY Left 1996   plated   KNEE ARTHROSCOPY Left 8937   plica removed   KNEE ARTHROSCOPY Right    7 total   NASAL SEPTUM SURGERY     ORIF DISTAL RADIUS FRACTURE Right    age 21   REVISION TOTAL KNEE ARTHROPLASTY Right 08/19/2017   REVISION TOTAL SHOULDER TO REVERSE TOTAL SHOULDER Left 10/11/2021   Procedure: LEFT REVISION TOTAL SHOULDER TO REVERSE TOTAL SHOULDER, RIGHT SHOULDER STERIOD INJECTION;  Surgeon: Hiram Gash, MD;  Location: [facility];  Service: Orthopedics;  Laterality: Left;   SHOULDER ARTHROSCOPY W/ ROTATOR CUFF REPAIR Right 2011   bicep tendon and labrium repair   SHOULDER ARTHROSCOPY WITH ROTATOR CUFF REPAIR Left 2012   w/ labrium repair   TOTAL KNEE ARTHROPLASTY Right 10/12/2013   Procedure: RIGHT TOTAL KNEE ARTHROPLASTY;  Surgeon: Lorn Junes, MD;  Location: [facility];  Service: Orthopedics;  Laterality: Right;   VASECTOMY      There were no vitals filed for this visit.                                   PT Short Term Goals - 12/19/21 1323        PT SHORT TERM GOAL #2   Title L shoulder AROM to be at least 160 degrees flexion and ABD, no more than 30 degrees ER AROM    Status Achieved      PT SHORT TERM GOAL #3   Title Will have better understanding of postural awareness and mechanics    Status Achieved      PT SHORT TERM GOAL #4   Title L shoulder strength to be at least 3 to 3+/5 in all tested groups    Status Achieved               PT Long Term Goals - 05/22/22 1147       PT LONG TERM GOAL #1   Title L shoulder MMT to be at least 4/5 in all muscle groups    Status Achieved      PT LONG TERM GOAL #2   Title Will be able to lift a 10# weight overhead with good mechanics and without pain in L shoulder    Status Partially Met  PT LONG TERM GOAL #3   Title Will be able to perform appropriate functional tasks at home and at the farm within limits of protocol/MD guidance without increase in shoulder pain    Status Achieved      PT LONG TERM GOAL #4   Title Will be able to perform L shoulder IR and extension to full range as appropriate within limits of protocol    Status Achieved      PT LONG TERM GOAL #5   Title Patient will report 50% stiffness or better after activities that involve upper extremities.    Status Achieved                     Patient will benefit from skilled therapeutic intervention in order to improve the following deficits and impairments:     Visit Diagnosis: Stiffness of left shoulder, not elsewhere classified  Muscle weakness (generalized)     Problem List Patient Active Problem List   Diagnosis Date Noted   BCC (basal cell carcinoma of skin) 06/29/2019   Chronic leukopenia 09/24/2018   PCP NOTES >>>>>>>>>>>>>> 09/24/2018   Anxiety 09/24/2018   Insomnia 09/24/2018   History of atrial fibrillation 09/24/2018   Irregular heart rhythm 02/24/2016   Dysfunction of both eustachian tubes 01/18/2016   DJD (degenerative joint disease) 10/12/2013   Pars defect  of lumbar spine     Tannis Burstein,[name], PTA 06/07/2022, 1:58 PM  [facility]. [place], [place], [address] Phone: [phone]   Fax:  [phone]  Name: Bruce Cobb MRN: [id number] Date of Birth: 04-20-1966  [place]. [place], [place], [address] Phone: [phone]   Fax:  [phone]  Patient Details  Name: Bruce Cobb MRN: [id number] Date of Birth: 22-Nov-1965 Referring Provider:  No ref. provider found  Encounter Date: 06/07/2022   Laqueta Carina, PTA 06/07/2022, 1:58 PM  [facility]. [place], [place], [address] Phone: [phone]   Fax:  [phone]

## 2022-07-09 ENCOUNTER — Encounter: Payer: Self-pay | Admitting: Orthopaedic Surgery

## 2022-07-10 ENCOUNTER — Other Ambulatory Visit: Payer: BC Managed Care – PPO

## 2022-07-10 ENCOUNTER — Ambulatory Visit
Admission: RE | Admit: 2022-07-10 | Discharge: 2022-07-10 | Disposition: A | Discharge status: Home / Self Care | Payer: BC Managed Care – PPO | Source: Ambulatory Visit | Attending: Orthopaedic Surgery | Admitting: Orthopaedic Surgery

## 2022-07-10 DIAGNOSIS — M19012 Primary osteoarthritis, left shoulder: Secondary | ICD-10-CM | POA: Diagnosis not present

## 2022-07-10 DIAGNOSIS — Z01812 Encounter for preprocedural laboratory examination: Secondary | ICD-10-CM | POA: Diagnosis not present

## 2022-07-10 DIAGNOSIS — Z01818 Encounter for other preprocedural examination: Secondary | ICD-10-CM | POA: Diagnosis not present

## 2022-07-10 DIAGNOSIS — M25511 Pain in right shoulder: Secondary | ICD-10-CM | POA: Diagnosis not present

## 2022-07-10 DIAGNOSIS — M19011 Primary osteoarthritis, right shoulder: Secondary | ICD-10-CM | POA: Diagnosis not present

## 2022-07-30 DIAGNOSIS — M19011 Primary osteoarthritis, right shoulder: Secondary | ICD-10-CM | POA: Diagnosis not present

## 2022-07-30 DIAGNOSIS — G8918 Other acute postprocedural pain: Secondary | ICD-10-CM | POA: Diagnosis not present

## 2022-08-01 ENCOUNTER — Ambulatory Visit: Payer: BC Managed Care – PPO | Attending: Orthopaedic Surgery | Admitting: Physical Therapy

## 2022-08-01 ENCOUNTER — Encounter: Payer: Self-pay | Admitting: Physical Therapy

## 2022-08-01 DIAGNOSIS — M6281 Muscle weakness (generalized): Secondary | ICD-10-CM | POA: Diagnosis not present

## 2022-08-01 DIAGNOSIS — M25611 Stiffness of right shoulder, not elsewhere classified: Secondary | ICD-10-CM | POA: Insufficient documentation

## 2022-08-01 DIAGNOSIS — M25511 Pain in right shoulder: Secondary | ICD-10-CM | POA: Diagnosis not present

## 2022-08-01 DIAGNOSIS — R293 Abnormal posture: Secondary | ICD-10-CM | POA: Insufficient documentation

## 2022-08-01 NOTE — Progress Notes (Signed)
OUTPATIENT PHYSICAL THERAPY SHOULDER EVALUATION   Patient Name: Bruce Cobb MRN: [id number] DOB:February 07, 1966, 56 y.o., male Today's Date: 08/01/2022  END OF SESSION:  PT End of Session - 08/01/22 1427     Visit Number 1    Date for PT Re-Evaluation 10/24/22    PT Start Time 1318    PT Stop Time 1354    PT Time Calculation (min) 36 min    Activity Tolerance Patient tolerated treatment well;Patient limited by pain    Behavior During Therapy [facility] for tasks assessed/performed             Past Medical History:  Diagnosis Date   BCC (basal cell carcinoma of skin)    Hematospermia    hx of, remotely    Paroxysmal atrial fibrillation ([place]) 09/2017   saw cards   Pars defect of lumbar spine    PONV (postoperative nausea and vomiting)    Right knee DJD    Shingles 2014   face   Past Surgical History:  Procedure Laterality Date   ANKLE FRACTURE SURGERY Left 1996   plated   KNEE ARTHROSCOPY Left 0947   plica removed   KNEE ARTHROSCOPY Right    7 total   NASAL SEPTUM SURGERY     ORIF DISTAL RADIUS FRACTURE Right    age 34   REVISION TOTAL KNEE ARTHROPLASTY Right 08/19/2017   REVISION TOTAL SHOULDER TO REVERSE TOTAL SHOULDER Left 10/11/2021   Procedure: LEFT REVISION TOTAL SHOULDER TO REVERSE TOTAL SHOULDER, RIGHT SHOULDER STERIOD INJECTION;  Surgeon: Hiram Gash, MD;  Location: [facility];  Service: Orthopedics;  Laterality: Left;   SHOULDER ARTHROSCOPY W/ ROTATOR CUFF REPAIR Right 2011   bicep tendon and labrium repair   SHOULDER ARTHROSCOPY WITH ROTATOR CUFF REPAIR Left 2012   w/ labrium repair   TOTAL KNEE ARTHROPLASTY Right 10/12/2013   Procedure: RIGHT TOTAL KNEE ARTHROPLASTY;  Surgeon: Lorn Junes, MD;  Location: [place];  Service: Orthopedics;  Laterality: Right;   VASECTOMY     Patient Active Problem List   Diagnosis Date Noted   BCC (basal cell carcinoma of skin) 06/29/2019   Chronic leukopenia 09/24/2018   PCP NOTES >>>>>>>>>>>>>> 09/24/2018   Anxiety 09/24/2018    Insomnia 09/24/2018   History of atrial fibrillation 09/24/2018   Irregular heart rhythm 02/24/2016   Dysfunction of both eustachian tubes 01/18/2016   DJD (degenerative joint disease) 10/12/2013   Pars defect of lumbar spine     PCP: N/A  REFERRING PROVIDER: Ophelia Charter, MD  REFERRING DIAG: R TSR  THERAPY DIAG:  Muscle weakness (generalized)  Abnormal posture  Acute pain of right shoulder  Stiffness of right shoulder, not elsewhere classified  Rationale for Evaluation and Treatment: Rehabilitation  ONSET DATE: 07/30/22  SUBJECTIVE:  SUBJECTIVE STATEMENT: Patient reports he is 2 days post op. His L shoulder is doing ok. He has developed TP in his B traps which are aggravating his pain.  PERTINENT HISTORY: PMHx-LTSR 12/21, L TSR revision 10/11/21, TKE and revision, basal cell, PAF  PAIN:  Are you having pain? Yes: NPRS scale: 7/10 Pain location: R shoulder Pain description: sharp, post op Aggravating factors: moving at night Relieving factors: Not much helps currently.  PRECAUTIONS: Shoulder  WEIGHT BEARING RESTRICTIONS: No  FALLS:  Has patient fallen in last 6 months? 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, farming  PLOF: Independent  PATIENT GOALS:Recover his ability to resume his normal activities  NEXT MD VISIT:   OBJECTIVE:   DIAGNOSTIC FINDINGS:  N/A  PATIENT SURVEYS:  FOTO 25  COGNITION: Overall cognitive status: Within functional limits for tasks assessed     SENSATION: Not tested  POSTURE: Rounded shoulders  UPPER EXTREMITY ROM:   Passive ROM Right eval Left eval  Shoulder flexion 71   Shoulder extension    Shoulder abduction    Shoulder  adduction    Shoulder internal rotation 51   Shoulder external rotation 58   Elbow flexion    Elbow extension    Wrist flexion    Wrist extension    Wrist ulnar deviation    Wrist radial deviation    Wrist pronation    Wrist supination    (Blank rows = not tested)  UPPER EXTREMITY MMT: Strength testing of R shoulder deferred. Active movement in R elbow, wrist, hand/fingers   PALPATION:  Swelling noted throughout RUE from shoulder to hand. Ptient reports tenderness over incision.   TODAY'S TREATMENT:                                                                                                                                         DATE:  08/01/22 Eduction PROM to R shoulder in flex, ER/IR, very gentle within tolerance.  Pendulum exercises.   PATIENT EDUCATION: Education details: POC, initial HEP Person educated: Patient and Spouse Education method: Customer service manager Education comprehension: verbalized understanding  HOME EXERCISE PROGRAM: [id number]  ASSESSMENT:  CLINICAL IMPRESSION: Patient is a 56 y.o. who was seen today for physical therapy evaluation and treatment for Post op R TSR. He has a lot of swelling throughout his RUE. Educated to elevate it on pillows while lying down and performing grip/hand open, wrist mobility, elbow flexion and ext, forearm pronation/supination for swelling as well as for ROM of these joints. Educated patient and his wife to PROM exercises to perform over the next 4 weeks. He also reports continued TP in BUE. He will benefit from continuing, progressive PT as he recovers from his surgery in order to maximize his RUE functional use and promote return to his PLOF.  OBJECTIVE IMPAIRMENTS:  decreased mobility, decreased ROM, decreased strength, increased edema, increased muscle spasms, impaired flexibility, impaired sensation, impaired UE functional use, postural dysfunction, and pain.   ACTIVITY LIMITATIONS: carrying, lifting,  bending, sleeping, bathing, toileting, dressing, reach over head, and hygiene/grooming  PARTICIPATION LIMITATIONS: meal prep, cleaning, laundry, interpersonal relationship, driving, shopping, community activity, occupation, and yard work  PERSONAL FACTORS: Past/current experiences are also affecting patient's functional outcome.   REHAB POTENTIAL: Good  CLINICAL DECISION MAKING: Evolving/moderate complexity  EVALUATION COMPLEXITY: Moderate   GOALS: Goals reviewed with patient? Yes  SHORT TERM GOALS: Target date: 08/22/22  I with initial HEP Baseline: Goal status: INITIAL  LONG TERM GOALS: Target date: 11/22/22  I with final HEP Baseline:  Goal status: INITIAL  2.  Increase R shoulder strength to at least 4/5 Baseline:  Goal status: INITIAL  3.  Increase R shoulder ROM to WNL, following appropriate protocol restrictions Baseline:  Goal status: INITIAL  4.  Patient will be able to complete all of his required daily activities with R shoulder pain < 3/10, following all appropriate restrictions Baseline:  Goal status: INITIAL  5.  Patient will be able to lift 10# weight overhead with good control, in RUE, pain < 3/10 Baseline:  Goal status: INITIAL 6.  Increase FOTO score to at least 59  Baseline: 25  Goal status: INITIAL PLAN:  PT FREQUENCY: 2x/week  PT DURATION: 12 weeks  PLANNED INTERVENTIONS: Therapeutic exercises, Therapeutic activity, Neuromuscular re-education, Balance training, Gait training, Patient/Family education, Self Care, Joint mobilization, Dry Needling, Electrical stimulation, Cryotherapy, Moist heat, Taping, Vasopneumatic device, Ultrasound, Ionotophoresis '4mg'$ /ml Dexamethasone, and Manual therapy  PLAN FOR NEXT SESSION: Update HEP based upon TSR protocols.   Marcelina Morel, DPT 08/01/2022, 2:35 PM

## 2022-08-07 DIAGNOSIS — M19011 Primary osteoarthritis, right shoulder: Secondary | ICD-10-CM | POA: Diagnosis not present

## 2022-08-30 ENCOUNTER — Ambulatory Visit: Payer: BC Managed Care – PPO | Attending: Orthopaedic Surgery | Admitting: Physical Therapy

## 2022-08-30 DIAGNOSIS — M6281 Muscle weakness (generalized): Secondary | ICD-10-CM | POA: Insufficient documentation

## 2022-08-30 DIAGNOSIS — R293 Abnormal posture: Secondary | ICD-10-CM | POA: Insufficient documentation

## 2022-08-30 DIAGNOSIS — M25511 Pain in right shoulder: Secondary | ICD-10-CM | POA: Insufficient documentation

## 2022-08-30 DIAGNOSIS — C4441 Basal cell carcinoma of skin of scalp and neck: Secondary | ICD-10-CM | POA: Diagnosis not present

## 2022-08-30 NOTE — Therapy (Signed)
OUTPATIENT PHYSICAL THERAPY SHOULDER TREATMENT     Patient Name: Bruce Cobb MRN: [id number] DOB:1965-11-28, 56 y.o., male Today's Date: 08/01/2022              Past Medical History:  Diagnosis Date   BCC (basal cell carcinoma of skin)     Hematospermia      hx of, remotely    Paroxysmal atrial fibrillation ([place]) 09/2017    saw cards   Pars defect of lumbar spine     PONV (postoperative nausea and vomiting)     Right knee DJD     Shingles 2014    face         Past Surgical History:  Procedure Laterality Date   ANKLE FRACTURE SURGERY Left 1996    plated   KNEE ARTHROSCOPY Left 3017    plica removed   KNEE ARTHROSCOPY Right      7 total   NASAL SEPTUM SURGERY       ORIF DISTAL RADIUS FRACTURE Right      age 18   REVISION TOTAL KNEE ARTHROPLASTY Right 08/19/2017   REVISION TOTAL SHOULDER TO REVERSE TOTAL SHOULDER Left 10/11/2021    Procedure: LEFT REVISION TOTAL SHOULDER TO REVERSE TOTAL SHOULDER, RIGHT SHOULDER STERIOD INJECTION;  Surgeon: Hiram Gash, MD;  Location: [facility];  Service: Orthopedics;  Laterality: Left;   SHOULDER ARTHROSCOPY W/ ROTATOR CUFF REPAIR Right 2011    bicep tendon and labrium repair   SHOULDER ARTHROSCOPY WITH ROTATOR CUFF REPAIR Left 2012    w/ labrium repair   TOTAL KNEE ARTHROPLASTY Right 10/12/2013    Procedure: RIGHT TOTAL KNEE ARTHROPLASTY;  Surgeon: Lorn Junes, MD;  Location: [facility];  Service: Orthopedics;  Laterality: Right;   VASECTOMY            Patient Active Problem List    Diagnosis Date Noted   BCC (basal cell carcinoma of skin) 06/29/2019   Chronic leukopenia 09/24/2018   PCP NOTES >>>>>>>>>>>>>> 09/24/2018   Anxiety 09/24/2018   Insomnia 09/24/2018   History of atrial fibrillation 09/24/2018   Irregular heart rhythm 02/24/2016   Dysfunction of both eustachian tubes 01/18/2016   DJD (degenerative joint disease) 10/12/2013   Pars defect of lumbar spine        PCP: N/A   REFERRING PROVIDER: Ophelia Charter, MD   REFERRING DIAG: R TSR   THERAPY DIAG:  Muscle weakness (generalized)   Abnormal posture   Acute pain of right shoulder   Stiffness of right shoulder, not elsewhere classified   Rationale for Evaluation and Treatment: Rehabilitation   ONSET DATE: 07/30/22   SUBJECTIVE:  SUBJECTIVE STATEMENT: Trying to stretch but very painful and a lot of spasms  PERTINENT HISTORY: PMHx-LTSR 12/21, L TSR revision 10/11/21, TKE and revision, basal cell, PAF   PAIN:  Are you having pain? Yes: NPRS scale: 7/10 Pain location: R shoulder Pain description: sharp, post op Aggravating factors: moving at night Relieving factors: Not much helps currently.   PRECAUTIONS: Shoulder   WEIGHT BEARING RESTRICTIONS: No   FALLS:  Has patient fallen in last 6 months? 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, farming   PLOF: Independent   PATIENT GOALS:Recover his ability to resume his normal activities   NEXT MD VISIT:    OBJECTIVE:    DIAGNOSTIC FINDINGS:  N/A   PATIENT SURVEYS:  FOTO 25   COGNITION: Overall cognitive status: Within functional limits for tasks assessed                                  SENSATION: Not tested   POSTURE: Rounded shoulders   UPPER EXTREMITY ROM:    Passive ROM Right eval Left eval 08/30/22 RT shld supine  Shoulder flexion 71   132  Shoulder extension       Shoulder abduction     92  Shoulder adduction       Shoulder internal rotation 51   65  Shoulder external rotation 58   20  Elbow flexion       Elbow extension       Wrist flexion       Wrist extension       Wrist ulnar deviation       Wrist radial deviation       Wrist pronation       Wrist  supination       (Blank rows = not tested)   UPPER EXTREMITY MMT: Strength testing of R shoulder deferred. Active movement in R elbow, wrist, hand/fingers     PALPATION:  Swelling noted throughout RUE from shoulder to hand. Ptient reports tenderness over incision.             TODAY'S TREATMENT:                                                                                                                                         DATE:   08/30/22 PROM/stretching RT UE various positions supine and sitting, tried pulleys, tried distraction pt very quarded, spams present and increased pain with abd and ER STW to RT shld to relax muscles,teres very tender. DN by MAlbright PT to teres Reviewed and corrected tech for pendulum and added 3# wt for distraction Mat slides- instructed to do at counter top at home  08/01/22 Eduction PROM to R shoulder in flex, ER/IR, very gentle  within tolerance.  Pendulum exercises.     PATIENT EDUCATION: Education details: POC, initial HEP Person educated: Patient and Spouse Education method: Customer service manager Education comprehension: verbalized understanding   HOME EXERCISE PROGRAM: [id number]   ASSESSMENT:   CLINICAL IMPRESSION: Patient is painful,guarded and spasms.Tried multi tech to be able to tolerate better. Corrected pendulum tech and showed for home and added table slides. Increased freq to 2 x a week  OBJECTIVE IMPAIRMENTS: decreased mobility, decreased ROM, decreased strength, increased edema, increased muscle spasms, impaired flexibility, impaired sensation, impaired UE functional use, postural dysfunction, and pain.    ACTIVITY LIMITATIONS: carrying, lifting, bending, sleeping, bathing, toileting, dressing, reach over head, and hygiene/grooming   PARTICIPATION LIMITATIONS: meal prep, cleaning, laundry, interpersonal relationship, driving, shopping, community activity, occupation, and yard work   PERSONAL FACTORS: Past/current  experiences are also affecting patient's functional outcome.    REHAB POTENTIAL: Good   CLINICAL DECISION MAKING: Evolving/moderate complexity   EVALUATION COMPLEXITY: Moderate     GOALS: Goals reviewed with patient? Yes   SHORT TERM GOALS: Target date: 08/22/22   I with initial HEP Baseline: Goal status: INITIAL   LONG TERM GOALS: Target date: 11/22/22   I with final HEP Baseline:  Goal status: met 08/30/22   2.  Increase R shoulder strength to at least 4/5 Baseline:  Goal status: INITIAL   3.  Increase R shoulder ROM to WNL, following appropriate protocol restrictions Baseline:  Goal status: INITIAL   4.  Patient will be able to complete all of his required daily activities with R shoulder pain < 3/10, following all appropriate restrictions Baseline:  Goal status: INITIAL   5.  Patient will be able to lift 10# weight overhead with good control, in RUE, pain < 3/10 Baseline:  Goal status: INITIAL 6.  Increase FOTO score to at least 59            Baseline: 25            Goal status: INITIAL PLAN:   PT FREQUENCY: 2x/week   PT DURATION: 12 weeks   PLANNED INTERVENTIONS: Therapeutic exercises, Therapeutic activity, Neuromuscular re-education, Balance training, Gait training, Patient/Family education, Self Care, Joint mobilization, Dry Needling, Electrical stimulation, Cryotherapy, Moist heat, Taping, Vasopneumatic device, Ultrasound, Ionotophoresis 14m/ml Dexamethasone, and Manual therapy   PLAN FOR NEXT SESSION: pt to bring updated info from MD     [place] [id number]: [phone]  Fax:  [phone] Patient Details  Name: Bruce PARKEMRN: [id number] of Birth: [birth year]Referring Provider:  No ref. provider found  Encounter Date: 08/30/2022   PVickii Penna12/28/2023, 2:31 PM  [place] [address]Phone: [phone]  Fax:  [phone]

## 2022-09-04 ENCOUNTER — Ambulatory Visit: Payer: BC Managed Care – PPO | Attending: Orthopaedic Surgery | Admitting: Physical Therapy

## 2022-09-04 DIAGNOSIS — R293 Abnormal posture: Secondary | ICD-10-CM | POA: Insufficient documentation

## 2022-09-04 DIAGNOSIS — M25511 Pain in right shoulder: Secondary | ICD-10-CM | POA: Diagnosis not present

## 2022-09-04 DIAGNOSIS — M6281 Muscle weakness (generalized): Secondary | ICD-10-CM | POA: Insufficient documentation

## 2022-09-04 DIAGNOSIS — M25611 Stiffness of right shoulder, not elsewhere classified: Secondary | ICD-10-CM | POA: Insufficient documentation

## 2022-09-04 NOTE — Therapy (Signed)
OUTPATIENT PHYSICAL THERAPY SHOULDER TREATMENT     Patient Name: Bruce Cobb MRN: [id number] DOB:1965-11-28, 57 y.o., male Today's Date: 08/01/2022              Past Medical History:  Diagnosis Date   BCC (basal cell carcinoma of skin)     Hematospermia      hx of, remotely    Paroxysmal atrial fibrillation ([place]) 09/2017    saw cards   Pars defect of lumbar spine     PONV (postoperative nausea and vomiting)     Right knee DJD     Shingles 2014    face         Past Surgical History:  Procedure Laterality Date   ANKLE FRACTURE SURGERY Left 1996    plated   KNEE ARTHROSCOPY Left 3017    plica removed   KNEE ARTHROSCOPY Right      7 total   NASAL SEPTUM SURGERY       ORIF DISTAL RADIUS FRACTURE Right      age 18   REVISION TOTAL KNEE ARTHROPLASTY Right 08/19/2017   REVISION TOTAL SHOULDER TO REVERSE TOTAL SHOULDER Left 10/11/2021    Procedure: LEFT REVISION TOTAL SHOULDER TO REVERSE TOTAL SHOULDER, RIGHT SHOULDER STERIOD INJECTION;  Surgeon: Hiram Gash, MD;  Location: [facility];  Service: Orthopedics;  Laterality: Left;   SHOULDER ARTHROSCOPY W/ ROTATOR CUFF REPAIR Right 2011    bicep tendon and labrium repair   SHOULDER ARTHROSCOPY WITH ROTATOR CUFF REPAIR Left 2012    w/ labrium repair   TOTAL KNEE ARTHROPLASTY Right 10/12/2013    Procedure: RIGHT TOTAL KNEE ARTHROPLASTY;  Surgeon: Lorn Junes, MD;  Location: [facility];  Service: Orthopedics;  Laterality: Right;   VASECTOMY            Patient Active Problem List    Diagnosis Date Noted   BCC (basal cell carcinoma of skin) 06/29/2019   Chronic leukopenia 09/24/2018   PCP NOTES >>>>>>>>>>>>>> 09/24/2018   Anxiety 09/24/2018   Insomnia 09/24/2018   History of atrial fibrillation 09/24/2018   Irregular heart rhythm 02/24/2016   Dysfunction of both eustachian tubes 01/18/2016   DJD (degenerative joint disease) 10/12/2013   Pars defect of lumbar spine        PCP: N/A   REFERRING PROVIDER: Ophelia Charter, MD   REFERRING DIAG: R TSR   THERAPY DIAG:  Muscle weakness (generalized)   Abnormal posture   Acute pain of right shoulder   Stiffness of right shoulder, not elsewhere classified   Rationale for Evaluation and Treatment: Rehabilitation   ONSET DATE: 07/30/22   SUBJECTIVE:  SUBJECTIVE STATEMENT: Pt sttaes some better, not as many spasms but still grabs him PERTINENT HISTORY: PMHx-LTSR 12/21, L TSR revision 10/11/21, TKE and revision, basal cell, PAF   PAIN:  Are you having pain? Yes: NPRS scale: 3-7/10 Pain location: R shoulder Pain description: sharp, post op Aggravating factors: moving at night Relieving factors: Not much helps currently.   PRECAUTIONS: Shoulder   WEIGHT BEARING RESTRICTIONS: No   FALLS:  Has patient fallen in last 6 months? 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, farming   PLOF: Independent   PATIENT GOALS:Recover his ability to resume his normal activities   NEXT MD VISIT:    OBJECTIVE:    DIAGNOSTIC FINDINGS:  N/A   PATIENT SURVEYS:  FOTO 25   COGNITION: Overall cognitive status: Within functional limits for tasks assessed                                  SENSATION: Not tested   POSTURE: Rounded shoulders   UPPER EXTREMITY ROM:    Passive ROM Right eval Left eval 08/30/22 RT shld supine  Shoulder flexion 71   132  Shoulder extension       Shoulder abduction     92  Shoulder adduction       Shoulder internal rotation 51   65  Shoulder external rotation 58   20  Elbow flexion       Elbow extension       Wrist flexion       Wrist extension       Wrist ulnar deviation       Wrist radial deviation       Wrist pronation        Wrist supination       (Blank rows = not tested)   UPPER EXTREMITY MMT: Strength testing of R shoulder deferred. Active movement in R elbow, wrist, hand/fingers     PALPATION:  Swelling noted throughout RUE from shoulder to hand. Ptient reports tenderness over incision.             TODAY'S TREATMENT:                                                                                                                                         DATE:   09/04/22 UBE 3 min fwd/3 mn back L 1 Finger ladder Pulleys PTA assist with some catching PROM and stretching to RT shld sitting and supine  08/30/22 PROM/stretching RT UE various positions supine and sitting, tried pulleys, tried distraction pt very quarded, spams present and increased pain with abd and ER STW to RT shld to relax muscles,teres very tender. DN by MAlbright PT to teres Reviewed and corrected tech for pendulum and  added 3# wt for distraction Mat slides- instructed to do at counter top at home  08/01/22 Eduction PROM to R shoulder in flex, ER/IR, very gentle within tolerance.  Pendulum exercises.     PATIENT EDUCATION: Education details: POC, initial HEP Person educated: Patient and Spouse Education method: Customer service manager Education comprehension: verbalized understanding   HOME EXERCISE PROGRAM: [id number]   ASSESSMENT:   CLINICAL IMPRESSION: pt arrives with less guarding and looser mvmt with walk. Pt tolerated session better but did still have some catching that caused 10/10 pain and had to stop mvmt/actvity. Overall noted improvement form last sesison OBJECTIVE IMPAIRMENTS: decreased mobility, decreased ROM, decreased strength, increased edema, increased muscle spasms, impaired flexibility, impaired sensation, impaired UE functional use, postural dysfunction, and pain.    ACTIVITY LIMITATIONS: carrying, lifting, bending, sleeping, bathing, toileting, dressing, reach over head, and hygiene/grooming    PARTICIPATION LIMITATIONS: meal prep, cleaning, laundry, interpersonal relationship, driving, shopping, community activity, occupation, and yard work   PERSONAL FACTORS: Past/current experiences are also affecting patient's functional outcome.    REHAB POTENTIAL: Good   CLINICAL DECISION MAKING: Evolving/moderate complexity   EVALUATION COMPLEXITY: Moderate     GOALS: Goals reviewed with patient? Yes   SHORT TERM GOALS: Target date: 08/22/22   I with initial HEP Baseline: Goal status: INITIAL   LONG TERM GOALS: Target date: 11/22/22   I with final HEP Baseline:  Goal status: met 08/30/22   2.  Increase R shoulder strength to at least 4/5 Baseline:  Goal status: INITIAL   3.  Increase R shoulder ROM to WNL, following appropriate protocol restrictions Baseline:  Goal status: INITIAL   4.  Patient will be able to complete all of his required daily activities with R shoulder pain < 3/10, following all appropriate restrictions Baseline:  Goal status: INITIAL   5.  Patient will be able to lift 10# weight overhead with good control, in RUE, pain < 3/10 Baseline:  Goal status: INITIAL 6.  Increase FOTO score to at least 59            Baseline: 25            Goal status: INITIAL PLAN:   PT FREQUENCY: 2x/week   PT DURATION: 12 weeks   PLANNED INTERVENTIONS: Therapeutic exercises, Therapeutic activity, Neuromuscular re-education, Balance training, Gait training, Patient/Family education, Self Care, Joint mobilization, Dry Needling, Electrical stimulation, Cryotherapy, Moist heat, Taping, Vasopneumatic device, Ultrasound, Ionotophoresis 87m/ml Dexamethasone, and Manual therapy   PLAN FOR NEXT SESSION: pt to bring updated info from MD- pt stated called MD but no response. F/u with MD next week     [place] [address]Phone: [phone]  Fax:  [phone] Patient Details  Name:  Bruce GLADDENMRN: [id number] of Birth: [birth year]Referring Provider:  No ref. provider found  Encounter Date: 09/04/2022   PVickii Penna1/10/2022, 12:31 PM  [place] [address]Phone: [phone]  Fax:  [phone]            OUTPATIENT PHYSICAL THERAPY SHOULDER TREATMENT     Patient Name: Bruce PEACOCKMRN: [id number]:901/19/1967 57y.o., male Today's Date: 08/01/2022              Past Medical History:  Diagnosis Date   BCC (basal cell carcinoma of skin)     Hematospermia  hx of, remotely    Paroxysmal atrial fibrillation ([place]) 09/2017    saw cards   Pars defect of lumbar spine     PONV (postoperative nausea and vomiting)     Right knee DJD     Shingles 2014    face         Past Surgical History:  Procedure Laterality Date   ANKLE FRACTURE SURGERY Left 1996    plated   KNEE ARTHROSCOPY Left 9892    plica removed   KNEE ARTHROSCOPY Right      7 total   NASAL SEPTUM SURGERY       ORIF DISTAL RADIUS FRACTURE Right      age 62   REVISION TOTAL KNEE ARTHROPLASTY Right 08/19/2017   REVISION TOTAL SHOULDER TO REVERSE TOTAL SHOULDER Left 10/11/2021    Procedure: LEFT REVISION TOTAL SHOULDER TO REVERSE TOTAL SHOULDER, RIGHT SHOULDER STERIOD INJECTION;  Surgeon: Hiram Gash, MD;  Location: [facility];  Service: Orthopedics;  Laterality: Left;   SHOULDER ARTHROSCOPY W/ ROTATOR CUFF REPAIR Right 2011    bicep tendon and labrium repair   SHOULDER ARTHROSCOPY WITH ROTATOR CUFF REPAIR Left 2012    w/ labrium repair   TOTAL KNEE ARTHROPLASTY Right 10/12/2013    Procedure: RIGHT TOTAL KNEE ARTHROPLASTY;  Surgeon: Lorn Junes, MD;  Location: [facility];  Service: Orthopedics;  Laterality: Right;   VASECTOMY            Patient Active Problem List    Diagnosis Date Noted   BCC (basal cell carcinoma of skin) 06/29/2019   Chronic leukopenia 09/24/2018   PCP NOTES >>>>>>>>>>>>>>  09/24/2018   Anxiety 09/24/2018   Insomnia 09/24/2018   History of atrial fibrillation 09/24/2018   Irregular heart rhythm 02/24/2016   Dysfunction of both eustachian tubes 01/18/2016   DJD (degenerative joint disease) 10/12/2013   Pars defect of lumbar spine        PCP: N/A   REFERRING PROVIDER: Ophelia Charter, MD   REFERRING DIAG: R TSR   THERAPY DIAG:  Muscle weakness (generalized)   Abnormal posture   Acute pain of right shoulder   Stiffness of right shoulder, not elsewhere classified   Rationale for Evaluation and Treatment: Rehabilitation   ONSET DATE: 07/30/22   SUBJECTIVE:                                                                                                                                                                                       SUBJECTIVE STATEMENT: Trying to stretch but very painful and a lot of spasms  PERTINENT HISTORY: PMHx-LTSR 12/21, L TSR revision 10/11/21, TKE and revision, basal cell,  PAF   PAIN:  Are you having pain? Yes: NPRS scale: 7/10 Pain location: R shoulder Pain description: sharp, post op Aggravating factors: moving at night Relieving factors: Not much helps currently.   PRECAUTIONS: Shoulder   WEIGHT BEARING RESTRICTIONS: No   FALLS:  Has patient fallen in last 6 months? 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, farming   PLOF: Independent   PATIENT GOALS:Recover his ability to resume his normal activities   NEXT MD VISIT:    OBJECTIVE:    DIAGNOSTIC FINDINGS:  N/A   PATIENT SURVEYS:  FOTO 25   COGNITION: Overall cognitive status: Within functional limits for tasks assessed                                  SENSATION: Not tested   POSTURE: Rounded shoulders   UPPER EXTREMITY ROM:    Passive ROM Right eval Left eval 08/30/22 RT shld supine  Shoulder flexion 71   132  Shoulder  extension       Shoulder abduction     92  Shoulder adduction       Shoulder internal rotation 51   65  Shoulder external rotation 58   20  Elbow flexion       Elbow extension       Wrist flexion       Wrist extension       Wrist ulnar deviation       Wrist radial deviation       Wrist pronation       Wrist supination       (Blank rows = not tested)   UPPER EXTREMITY MMT: Strength testing of R shoulder deferred. Active movement in R elbow, wrist, hand/fingers     PALPATION:  Swelling noted throughout RUE from shoulder to hand. Ptient reports tenderness over incision.             TODAY'S TREATMENT:                                                                                                                                         DATE:   08/30/22 PROM/stretching RT UE various positions supine and sitting, tried pulleys, tried distraction pt very quarded, spams present and increased pain with abd and ER STW to RT shld to relax muscles,teres very tender. DN by MAlbright PT to teres Reviewed and corrected tech for pendulum and added 3# wt for distraction Mat slides- instructed to do at counter top at home  08/01/22 Eduction PROM to R shoulder in flex, ER/IR, very gentle within tolerance.  Pendulum exercises.     PATIENT EDUCATION: Education details: POC, initial HEP Person educated: Patient and Spouse Education method: Dance movement psychotherapist  comprehension: verbalized understanding   HOME EXERCISE PROGRAM: [id number]   ASSESSMENT:   CLINICAL IMPRESSION: Patient is painful,guarded and spasms.Tried multi tech to be able to tolerate better. Corrected pendulum tech and showed for home and added table slides. Increased freq to 2 x a week  OBJECTIVE IMPAIRMENTS: decreased mobility, decreased ROM, decreased strength, increased edema, increased muscle spasms, impaired flexibility, impaired sensation, impaired UE functional use, postural dysfunction, and pain.     ACTIVITY LIMITATIONS: carrying, lifting, bending, sleeping, bathing, toileting, dressing, reach over head, and hygiene/grooming   PARTICIPATION LIMITATIONS: meal prep, cleaning, laundry, interpersonal relationship, driving, shopping, community activity, occupation, and yard work   PERSONAL FACTORS: Past/current experiences are also affecting patient's functional outcome.    REHAB POTENTIAL: Good   CLINICAL DECISION MAKING: Evolving/moderate complexity   EVALUATION COMPLEXITY: Moderate     GOALS: Goals reviewed with patient? Yes   SHORT TERM GOALS: Target date: 08/22/22   I with initial HEP Baseline: Goal status: INITIAL   LONG TERM GOALS: Target date: 11/22/22   I with final HEP Baseline:  Goal status: met 08/30/22   2.  Increase R shoulder strength to at least 4/5 Baseline:  Goal status: INITIAL   3.  Increase R shoulder ROM to WNL, following appropriate protocol restrictions Baseline:  Goal status: INITIAL   4.  Patient will be able to complete all of his required daily activities with R shoulder pain < 3/10, following all appropriate restrictions Baseline:  Goal status: INITIAL   5.  Patient will be able to lift 10# weight overhead with good control, in RUE, pain < 3/10 Baseline:  Goal status: INITIAL 6.  Increase FOTO score to at least 59            Baseline: 25            Goal status: INITIAL PLAN:   PT FREQUENCY: 2x/week   PT DURATION: 12 weeks   PLANNED INTERVENTIONS: Therapeutic exercises, Therapeutic activity, Neuromuscular re-education, Balance training, Gait training, Patient/Family education, Self Care, Joint mobilization, Dry Needling, Electrical stimulation, Cryotherapy, Moist heat, Taping, Vasopneumatic device, Ultrasound, Ionotophoresis 73m/ml Dexamethasone, and Manual therapy   PLAN FOR NEXT SESSION: pt to bring updated info from MD     [place]  [address]Phone: [phone]  Fax:  [phone] Patient Details  Name: Bruce FARRINGTONMRN: [id number] of Birth: 907/21/1967Referring Provider:  No ref. provider found  Encounter Date: 09/04/2022   PVickii Penna1/10/2022, 12:31 PM  [place] [address]Phone: [phone]  Fax:  [address] [address]Phone: [phone]  Fax:  [phone] Patient Details  Name: Bruce FORANDMRN: [id number] of Birth: 9December 29, 1967Referring Provider:  No ref. provider found  Encounter Date: 09/04/2022   PVickii Penna1/10/2022, 12:30 PM  [place] [address]Phone: [phone]  Fax:  [phone]

## 2022-09-06 ENCOUNTER — Ambulatory Visit: Payer: BC Managed Care – PPO | Admitting: Physical Therapy

## 2022-09-11 ENCOUNTER — Ambulatory Visit: Payer: BC Managed Care – PPO | Admitting: Physical Therapy

## 2022-09-11 DIAGNOSIS — M6281 Muscle weakness (generalized): Secondary | ICD-10-CM | POA: Diagnosis not present

## 2022-09-11 DIAGNOSIS — R293 Abnormal posture: Secondary | ICD-10-CM

## 2022-09-11 DIAGNOSIS — M25611 Stiffness of right shoulder, not elsewhere classified: Secondary | ICD-10-CM

## 2022-09-11 DIAGNOSIS — M25511 Pain in right shoulder: Secondary | ICD-10-CM

## 2022-09-11 DIAGNOSIS — M19011 Primary osteoarthritis, right shoulder: Secondary | ICD-10-CM | POA: Diagnosis not present

## 2022-09-11 NOTE — Therapy (Signed)
OUTPATIENT PHYSICAL THERAPY SHOULDER TREATMENT     Patient Name: Bruce Cobb MRN: [id number] DOB:1965-11-28, 56 y.o., male Today's Date: 08/01/2022              Past Medical History:  Diagnosis Date   BCC (basal cell carcinoma of skin)     Hematospermia      hx of, remotely    Paroxysmal atrial fibrillation ([place]) 09/2017    saw cards   Pars defect of lumbar spine     PONV (postoperative nausea and vomiting)     Right knee DJD     Shingles 2014    face         Past Surgical History:  Procedure Laterality Date   ANKLE FRACTURE SURGERY Left 1996    plated   KNEE ARTHROSCOPY Left 3017    plica removed   KNEE ARTHROSCOPY Right      7 total   NASAL SEPTUM SURGERY       ORIF DISTAL RADIUS FRACTURE Right      age 18   REVISION TOTAL KNEE ARTHROPLASTY Right 08/19/2017   REVISION TOTAL SHOULDER TO REVERSE TOTAL SHOULDER Left 10/11/2021    Procedure: LEFT REVISION TOTAL SHOULDER TO REVERSE TOTAL SHOULDER, RIGHT SHOULDER STERIOD INJECTION;  Surgeon: Hiram Gash, MD;  Location: [facility];  Service: Orthopedics;  Laterality: Left;   SHOULDER ARTHROSCOPY W/ ROTATOR CUFF REPAIR Right 2011    bicep tendon and labrium repair   SHOULDER ARTHROSCOPY WITH ROTATOR CUFF REPAIR Left 2012    w/ labrium repair   TOTAL KNEE ARTHROPLASTY Right 10/12/2013    Procedure: RIGHT TOTAL KNEE ARTHROPLASTY;  Surgeon: Lorn Junes, MD;  Location: [facility];  Service: Orthopedics;  Laterality: Right;   VASECTOMY            Patient Active Problem List    Diagnosis Date Noted   BCC (basal cell carcinoma of skin) 06/29/2019   Chronic leukopenia 09/24/2018   PCP NOTES >>>>>>>>>>>>>> 09/24/2018   Anxiety 09/24/2018   Insomnia 09/24/2018   History of atrial fibrillation 09/24/2018   Irregular heart rhythm 02/24/2016   Dysfunction of both eustachian tubes 01/18/2016   DJD (degenerative joint disease) 10/12/2013   Pars defect of lumbar spine        PCP: N/A   REFERRING PROVIDER: Ophelia Charter, MD   REFERRING DIAG: R TSR   THERAPY DIAG:  Muscle weakness (generalized)   Abnormal posture   Acute pain of right shoulder   Stiffness of right shoulder, not elsewhere classified   Rationale for Evaluation and Treatment: Rehabilitation   ONSET DATE: 07/30/22   SUBJECTIVE:  SUBJECTIVE STATEMENT: Bicep tendon still an issue, but catching less. Trying to relax and move shld more naturally PERTINENT HISTORY: PMHx-LTSR 12/21, L TSR revision 10/11/21, TKE and revision, basal cell, PAF   PAIN:  Are you having pain? Yes: NPRS scale: 3-7/10 Pain location: R shoulder Pain description: sharp, post op Aggravating factors: moving at night Relieving factors: Not much helps currently.   PRECAUTIONS: Shoulder   WEIGHT BEARING RESTRICTIONS: No   FALLS:  Has patient fallen in last 6 months? 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, farming   PLOF: Independent   PATIENT GOALS:Recover his ability to resume his normal activities   NEXT MD VISIT:    OBJECTIVE:    DIAGNOSTIC FINDINGS:  N/A   PATIENT SURVEYS:  FOTO 25   COGNITION: Overall cognitive status: Within functional limits for tasks assessed                                  SENSATION: Not tested   POSTURE: Rounded shoulders   UPPER EXTREMITY ROM:    Passive ROM Right eval Left eval 08/30/22 RT shld supine 09/06/21 RT shld supine 09/11/22 RT shld  Active /Passive Supine  Shoulder flexion 71   132 155 130/163  Shoulder extension         Shoulder abduction     92 102 90/110  Shoulder adduction         Shoulder internal rotation 51   65    Shoulder external rotation 58   20 32 45/52 (45 degrees abd)  Elbow flexion          Elbow extension         Wrist flexion         Wrist extension         Wrist ulnar deviation         Wrist radial deviation         Wrist pronation         Wrist supination         (Blank rows = not tested)   UPPER EXTREMITY MMT: Strength testing of R shoulder deferred. Active movement in R elbow, wrist, hand/fingers     PALPATION:  Swelling noted throughout RUE from shoulder to hand. Ptient reports tenderness over incision.             TODAY'S TREATMENT:                                                                                                                                         DATE:   09/11/22 UBE 63mn fwd/3back L2- very fatiguing Finger ladder PROM and stretching RT shld 2#cane ex supine chest press, flex,ER and abd 15 each 2# cane ex standing  upright row,shld ext 15 x each Yellow tband row and ext 15 x      09/04/22 UBE 3 min fwd/3 mn back L 1 Finger ladder Pulleys PTA assist with some catching PROM and stretching to RT shld sitting and supine  08/30/22 PROM/stretching RT UE various positions supine and sitting, tried pulleys, tried distraction pt very quarded, spams present and increased pain with abd and ER STW to RT shld to relax muscles,teres very tender. DN by MAlbright PT to teres Reviewed and corrected tech for pendulum and added 3# wt for distraction Mat slides- instructed to do at counter top at home  08/01/22 Eduction PROM to R shoulder in flex, ER/IR, very gentle within tolerance.  Pendulum exercises.     PATIENT EDUCATION: Education details: POC, initial HEP Person educated: Patient and Spouse Education method: Customer service manager Education comprehension: verbalized understanding   HOME EXERCISE PROGRAM: [id number]   ASSESSMENT:   CLINICAL IMPRESSION: pt arrives with less guarding and looser mvmt with walk. Pt tolerated session better but did still have some catching that caused 10/10 pain and had to stop mvmt/actvity. ROM taken  for MD appt this afternoon. Pt really struggling with catch in bicep tendon/area OBJECTIVE IMPAIRMENTS: decreased mobility, decreased ROM, decreased strength, increased edema, increased muscle spasms, impaired flexibility, impaired sensation, impaired UE functional use, postural dysfunction, and pain.    ACTIVITY LIMITATIONS: carrying, lifting, bending, sleeping, bathing, toileting, dressing, reach over head, and hygiene/grooming   PARTICIPATION LIMITATIONS: meal prep, cleaning, laundry, interpersonal relationship, driving, shopping, community activity, occupation, and yard work   PERSONAL FACTORS: Past/current experiences are also affecting patient's functional outcome.    REHAB POTENTIAL: Good   CLINICAL DECISION MAKING: Evolving/moderate complexity   EVALUATION COMPLEXITY: Moderate     GOALS: Goals reviewed with patient? Yes   SHORT TERM GOALS: Target date: 08/22/22   I with initial HEP Baseline: Goal status: INITIAL   LONG TERM GOALS: Target date: 11/22/22   I with final HEP Baseline:  Goal status: met 08/30/22   2.  Increase R shoulder strength to at least 4/5 Baseline:  Goal status: INITIAL   3.  Increase R shoulder ROM to WNL, following appropriate protocol restrictions Baseline:  Goal status: INITIAL   4.  Patient will be able to complete all of his required daily activities with R shoulder pain < 3/10, following all appropriate restrictions Baseline:  Goal status: INITIAL   5.  Patient will be able to lift 10# weight overhead with good control, in RUE, pain < 3/10 Baseline:  Goal status: INITIAL 6.  Increase FOTO score to at least 59            Baseline: 25            Goal status: INITIAL PLAN:   PT FREQUENCY: 2x/week   PT DURATION: 12 weeks   PLANNED INTERVENTIONS: Therapeutic exercises, Therapeutic activity, Neuromuscular re-education, Balance training, Gait training, Patient/Family education, Self Care, Joint mobilization, Dry Needling, Electrical  stimulation, Cryotherapy, Moist heat, Taping, Vasopneumatic device, Ultrasound, Ionotophoresis '4mg'$ /ml Dexamethasone, and Manual therapy   PLAN FOR NEXT SESSION: 6 weeks post op please advise if any precautions    [facility]. [place], [place], [address] Phone: [phone]   Fax:  [phone]  Patient Details  Name: Bruce Cobb MRN: [id number] Date of Birth: December 15, 1965 Referring Provider:  No ref. provider found  Encounter Date: 09/11/2022   Laqueta Carina, PTA 09/11/2022, 11:44 AM  [facility]  [place]. [place], [place], [address] Phone: [phone]   Fax:  [phone]             OUTPATIENT PHYSICAL THERAPY SHOULDER TREATMENT     Patient Name: Bruce Cobb MRN: [id number] DOB:27-Jan-1966, 57 y.o., male Today's Date: 08/01/2022              Past Medical History:  Diagnosis Date   BCC (basal cell carcinoma of skin)     Hematospermia      hx of, remotely    Paroxysmal atrial fibrillation ([place]) 09/2017    saw cards   Pars defect of lumbar spine     PONV (postoperative nausea and vomiting)     Right knee DJD     Shingles 2014    face         Past Surgical History:  Procedure Laterality Date   ANKLE FRACTURE SURGERY Left 1996    plated   KNEE ARTHROSCOPY Left 5929    plica removed   KNEE ARTHROSCOPY Right      7 total   NASAL SEPTUM SURGERY       ORIF DISTAL RADIUS FRACTURE Right      age 30   REVISION TOTAL KNEE ARTHROPLASTY Right 08/19/2017   REVISION TOTAL SHOULDER TO REVERSE TOTAL SHOULDER Left 10/11/2021    Procedure: LEFT REVISION TOTAL SHOULDER TO REVERSE TOTAL SHOULDER, RIGHT SHOULDER STERIOD INJECTION;  Surgeon: Hiram Gash, MD;  Location: [facility];  Service: Orthopedics;  Laterality: Left;   SHOULDER ARTHROSCOPY W/ ROTATOR CUFF REPAIR Right 2011    bicep tendon and labrium repair   SHOULDER ARTHROSCOPY WITH ROTATOR CUFF REPAIR Left 2012     w/ labrium repair   TOTAL KNEE ARTHROPLASTY Right 10/12/2013    Procedure: RIGHT TOTAL KNEE ARTHROPLASTY;  Surgeon: Lorn Junes, MD;  Location: [facility];  Service: Orthopedics;  Laterality: Right;   VASECTOMY            Patient Active Problem List    Diagnosis Date Noted   BCC (basal cell carcinoma of skin) 06/29/2019   Chronic leukopenia 09/24/2018   PCP NOTES >>>>>>>>>>>>>> 09/24/2018   Anxiety 09/24/2018   Insomnia 09/24/2018   History of atrial fibrillation 09/24/2018   Irregular heart rhythm 02/24/2016   Dysfunction of both eustachian tubes 01/18/2016   DJD (degenerative joint disease) 10/12/2013   Pars defect of lumbar spine        PCP: N/A   REFERRING PROVIDER: Ophelia Charter, MD   REFERRING DIAG: R TSR   THERAPY DIAG:  Muscle weakness (generalized)   Abnormal posture   Acute pain of right shoulder   Stiffness of right shoulder, not elsewhere classified   Rationale for Evaluation and Treatment: Rehabilitation   ONSET DATE: 07/30/22   SUBJECTIVE:  SUBJECTIVE STATEMENT: Trying to stretch but very painful and a lot of spasms  PERTINENT HISTORY: PMHx-LTSR 12/21, L TSR revision 10/11/21, TKE and revision, basal cell, PAF   PAIN:  Are you having pain? Yes: NPRS scale: 7/10 Pain location: R shoulder Pain description: sharp, post op Aggravating factors: moving at night Relieving factors: Not much helps currently.   PRECAUTIONS: Shoulder   WEIGHT BEARING RESTRICTIONS: No   FALLS:  Has patient fallen in last 6 months? 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, farming   PLOF: Independent   PATIENT GOALS:Recover his ability to resume his normal activities   NEXT MD  VISIT:    OBJECTIVE:    DIAGNOSTIC FINDINGS:  N/A   PATIENT SURVEYS:  FOTO 25   COGNITION: Overall cognitive status: Within functional limits for tasks assessed                                  SENSATION: Not tested   POSTURE: Rounded shoulders   UPPER EXTREMITY ROM:    Passive ROM Right eval Left eval 08/30/22 RT shld supine  Shoulder flexion 71   132  Shoulder extension       Shoulder abduction     92  Shoulder adduction       Shoulder internal rotation 51   65  Shoulder external rotation 58   20  Elbow flexion       Elbow extension       Wrist flexion       Wrist extension       Wrist ulnar deviation       Wrist radial deviation       Wrist pronation       Wrist supination       (Blank rows = not tested)   UPPER EXTREMITY MMT: Strength testing of R shoulder deferred. Active movement in R elbow, wrist, hand/fingers     PALPATION:  Swelling noted throughout RUE from shoulder to hand. Ptient reports tenderness over incision.             TODAY'S TREATMENT:                                                                                                                                         DATE:   08/30/22 PROM/stretching RT UE various positions supine and sitting, tried pulleys, tried distraction pt very quarded, spams present and increased pain with abd and ER STW to RT shld to relax muscles,teres very tender. DN by MAlbright PT to teres Reviewed and corrected tech for pendulum and added 3# wt for distraction Mat slides- instructed to do at counter top at home  08/01/22 Eduction PROM to R shoulder in flex, ER/IR, very gentle  within tolerance.  Pendulum exercises.     PATIENT EDUCATION: Education details: POC, initial HEP Person educated: Patient and Spouse Education method: Customer service manager Education comprehension: verbalized understanding   HOME EXERCISE PROGRAM: [id number]   ASSESSMENT:   CLINICAL IMPRESSION: Patient is  painful,guarded and spasms.Tried multi tech to be able to tolerate better. Corrected pendulum tech and showed for home and added table slides. Increased freq to 2 x a week  OBJECTIVE IMPAIRMENTS: decreased mobility, decreased ROM, decreased strength, increased edema, increased muscle spasms, impaired flexibility, impaired sensation, impaired UE functional use, postural dysfunction, and pain.    ACTIVITY LIMITATIONS: carrying, lifting, bending, sleeping, bathing, toileting, dressing, reach over head, and hygiene/grooming   PARTICIPATION LIMITATIONS: meal prep, cleaning, laundry, interpersonal relationship, driving, shopping, community activity, occupation, and yard work   PERSONAL FACTORS: Past/current experiences are also affecting patient's functional outcome.    REHAB POTENTIAL: Good   CLINICAL DECISION MAKING: Evolving/moderate complexity   EVALUATION COMPLEXITY: Moderate     GOALS: Goals reviewed with patient? Yes   SHORT TERM GOALS: Target date: 08/22/22   I with initial HEP Baseline: Goal status: INITIAL   LONG TERM GOALS: Target date: 11/22/22   I with final HEP Baseline:  Goal status: met 08/30/22   2.  Increase R shoulder strength to at least 4/5 Baseline:  Goal status: INITIAL   3.  Increase R shoulder ROM to WNL, following appropriate protocol restrictions Baseline:  Goal status: INITIAL   4.  Patient will be able to complete all of his required daily activities with R shoulder pain < 3/10, following all appropriate restrictions Baseline:  Goal status: INITIAL   5.  Patient will be able to lift 10# weight overhead with good control, in RUE, pain < 3/10 Baseline:  Goal status: INITIAL 6.  Increase FOTO score to at least 59            Baseline: 25            Goal status: INITIAL PLAN:   PT FREQUENCY: 2x/week   PT DURATION: 12 weeks   PLANNED INTERVENTIONS: Therapeutic exercises, Therapeutic activity, Neuromuscular re-education, Balance training, Gait  training, Patient/Family education, Self Care, Joint mobilization, Dry Needling, Electrical stimulation, Cryotherapy, Moist heat, Taping, Vasopneumatic device, Ultrasound, Ionotophoresis '4mg'$ /ml Dexamethasone, and Manual therapy   PLAN FOR NEXT SESSION: pt to bring updated info from MD     [facility]. [place], [place], [address] Phone: [phone]   Fax:  [phone]  Patient Details  Name: Bruce Cobb MRN: [id number] Date of Birth: 1966/02/25 Referring Provider:  No ref. provider found  Encounter Date: 09/11/2022   Vickii Penna 09/11/2022, 11:44 AM  [facility]. [place], [place], [address] Phone: [phone]   Fax:  [place]. [place], [place], [address] Phone: [phone]   Fax:  [phone]  Patient Details  Name: Bruce Cobb MRN: [id number] Date of Birth: [birth year] Referring Provider:  No ref. provider found  Encounter Date: 09/11/2022   Vickii Penna 09/11/2022, 11:44 AM  [place]. [place], [place], [address] Phone: [phone]   Fax:  [facility]. [place], [place], [address] Phone: [phone]   Fax:  [phone]  Patient Details  Name: Bruce Cobb MRN: [id number] Date of Birth: July 11, 1966 Referring Provider:  No ref. provider found  Encounter Date: 09/11/2022   Vickii Penna 09/11/2022, 11:44 AM  [place]. [place], [place], [address] Phone: [phone]   Fax:  [phone]

## 2022-09-13 ENCOUNTER — Ambulatory Visit: Payer: BC Managed Care – PPO | Admitting: Physical Therapy

## 2022-09-13 DIAGNOSIS — R293 Abnormal posture: Secondary | ICD-10-CM | POA: Diagnosis not present

## 2022-09-13 DIAGNOSIS — M25611 Stiffness of right shoulder, not elsewhere classified: Secondary | ICD-10-CM | POA: Diagnosis not present

## 2022-09-13 DIAGNOSIS — M25511 Pain in right shoulder: Secondary | ICD-10-CM

## 2022-09-13 DIAGNOSIS — M6281 Muscle weakness (generalized): Secondary | ICD-10-CM | POA: Diagnosis not present

## 2022-09-13 NOTE — Therapy (Signed)
OUTPATIENT PHYSICAL THERAPY SHOULDER TREATMENT     Patient Name: Bruce Cobb MRN: [id number] DOB:1965-11-28, 57 y.o., male Today's Date: 08/01/2022              Past Medical History:  Diagnosis Date   BCC (basal cell carcinoma of skin)     Hematospermia      hx of, remotely    Paroxysmal atrial fibrillation ([place]) 09/2017    saw cards   Pars defect of lumbar spine     PONV (postoperative nausea and vomiting)     Right knee DJD     Shingles 2014    face         Past Surgical History:  Procedure Laterality Date   ANKLE FRACTURE SURGERY Left 1996    plated   KNEE ARTHROSCOPY Left 3017    plica removed   KNEE ARTHROSCOPY Right      7 total   NASAL SEPTUM SURGERY       ORIF DISTAL RADIUS FRACTURE Right      age 18   REVISION TOTAL KNEE ARTHROPLASTY Right 08/19/2017   REVISION TOTAL SHOULDER TO REVERSE TOTAL SHOULDER Left 10/11/2021    Procedure: LEFT REVISION TOTAL SHOULDER TO REVERSE TOTAL SHOULDER, RIGHT SHOULDER STERIOD INJECTION;  Surgeon: Hiram Gash, MD;  Location: [facility];  Service: Orthopedics;  Laterality: Left;   SHOULDER ARTHROSCOPY W/ ROTATOR CUFF REPAIR Right 2011    bicep tendon and labrium repair   SHOULDER ARTHROSCOPY WITH ROTATOR CUFF REPAIR Left 2012    w/ labrium repair   TOTAL KNEE ARTHROPLASTY Right 10/12/2013    Procedure: RIGHT TOTAL KNEE ARTHROPLASTY;  Surgeon: Lorn Junes, MD;  Location: [facility];  Service: Orthopedics;  Laterality: Right;   VASECTOMY            Patient Active Problem List    Diagnosis Date Noted   BCC (basal cell carcinoma of skin) 06/29/2019   Chronic leukopenia 09/24/2018   PCP NOTES >>>>>>>>>>>>>> 09/24/2018   Anxiety 09/24/2018   Insomnia 09/24/2018   History of atrial fibrillation 09/24/2018   Irregular heart rhythm 02/24/2016   Dysfunction of both eustachian tubes 01/18/2016   DJD (degenerative joint disease) 10/12/2013   Pars defect of lumbar spine        PCP: N/A   REFERRING PROVIDER: Ophelia Charter, MD   REFERRING DIAG: R TSR   THERAPY DIAG:  Muscle weakness (generalized)   Abnormal posture   Acute pain of right shoulder   Stiffness of right shoulder, not elsewhere classified   Rationale for Evaluation and Treatment: Rehabilitation   ONSET DATE: 07/30/22   SUBJECTIVE:  SUBJECTIVE STATEMENT: MD pleased, did not focus or address catching. Focus on RTC healing. NO IR and less than 5# PERTINENT HISTORY: PMHx-LTSR 12/21, L TSR revision 10/11/21, TKE and revision, basal cell, PAF   PAIN:  Are you having pain? Yes: NPRS scale: 3-7/10 Pain location: R shoulder Pain description: sharp, post op Aggravating factors: moving at night Relieving factors: Not much helps currently.   PRECAUTIONS: Shoulder   WEIGHT BEARING RESTRICTIONS: No   FALLS:  Has patient fallen in last 6 months? 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, farming   PLOF: Independent   PATIENT GOALS:Recover his ability to resume his normal activities   NEXT MD VISIT:    OBJECTIVE:    DIAGNOSTIC FINDINGS:  N/A   PATIENT SURVEYS:  FOTO 25   COGNITION: Overall cognitive status: Within functional limits for tasks assessed                                  SENSATION: Not tested   POSTURE: Rounded shoulders   UPPER EXTREMITY ROM:    Passive ROM Right eval Left eval 08/30/22 RT shld supine 09/06/21 RT shld supine 09/11/22 RT shld  Active /Passive Supine  Shoulder flexion 71   132 155 130/163  Shoulder extension         Shoulder abduction     92 102 90/110  Shoulder adduction         Shoulder internal rotation 51   65    Shoulder external rotation 58   20 32 45/52 (45 degrees abd)  Elbow flexion          Elbow extension         Wrist flexion         Wrist extension         Wrist ulnar deviation         Wrist radial deviation         Wrist pronation         Wrist supination         (Blank rows = not tested)   UPPER EXTREMITY MMT: Strength testing of R shoulder deferred. Active movement in R elbow, wrist, hand/fingers     PALPATION:  Swelling noted throughout RUE from shoulder to hand. Ptient reports tenderness over incision.             TODAY'S TREATMENT:                                                                                                                                         DATE:   09/11/22 Nustep L 3 push/pull 2 min PROM and stretching with and without belt mobs flex,abd and ER 5# shruggs, backward rolls and scap squeeze 10  x Red tband shld ext and row 15 x  2# wAte bar AA to increase ROM flex and abd 10 x each 2# bar chest press ,flex ,abd and chest press     09/04/22 UBE 3 min fwd/3 mn back L 1 Finger ladder Pulleys PTA assist with some catching PROM and stretching to RT shld sitting and supine  08/30/22 PROM/stretching RT UE various positions supine and sitting, tried pulleys, tried distraction pt very quarded, spams present and increased pain with abd and ER STW to RT shld to relax muscles,teres very tender. DN by MAlbright PT to teres Reviewed and corrected tech for pendulum and added 3# wt for distraction Mat slides- instructed to do at counter top at home  08/01/22 Eduction PROM to R shoulder in flex, ER/IR, very gentle within tolerance.  Pendulum exercises.     PATIENT EDUCATION: Education details: POC, initial HEP Person educated: Patient and Spouse Education method: Customer service manager Education comprehension: verbalized understanding   HOME EXERCISE PROGRAM: [id number]   ASSESSMENT:   CLINICAL IMPRESSION:  Pt still with catching but did much better with belt mob stabilization OBJECTIVE IMPAIRMENTS: decreased mobility,  decreased ROM, decreased strength, increased edema, increased muscle spasms, impaired flexibility, impaired sensation, impaired UE functional use, postural dysfunction, and pain.    ACTIVITY LIMITATIONS: carrying, lifting, bending, sleeping, bathing, toileting, dressing, reach over head, and hygiene/grooming   PARTICIPATION LIMITATIONS: meal prep, cleaning, laundry, interpersonal relationship, driving, shopping, community activity, occupation, and yard work   PERSONAL FACTORS: Past/current experiences are also affecting patient's functional outcome.    REHAB POTENTIAL: Good   CLINICAL DECISION MAKING: Evolving/moderate complexity   EVALUATION COMPLEXITY: Moderate     GOALS: Goals reviewed with patient? Yes   SHORT TERM GOALS: Target date: 08/22/22   I with initial HEP Baseline: Goal status: INITIAL   LONG TERM GOALS: Target date: 11/22/22   I with final HEP Baseline:  Goal status: met 08/30/22   2.  Increase R shoulder strength to at least 4/5 Baseline:  Goal status: INITIAL   3.  Increase R shoulder ROM to WNL, following appropriate protocol restrictions Baseline:  Goal status: INITIAL   4.  Patient will be able to complete all of his required daily activities with R shoulder pain < 3/10, following all appropriate restrictions Baseline:  Goal status: INITIAL   5.  Patient will be able to lift 10# weight overhead with good control, in RUE, pain < 3/10 Baseline:  Goal status: INITIAL 6.  Increase FOTO score to at least 59            Baseline: 25            Goal status: INITIAL PLAN:   PT FREQUENCY: 2x/week   PT DURATION: 12 weeks   PLANNED INTERVENTIONS: Therapeutic exercises, Therapeutic activity, Neuromuscular re-education, Balance training, Gait training, Patient/Family education, Self Care, Joint mobilization, Dry Needling, Electrical stimulation, Cryotherapy, Moist heat, Taping, Vasopneumatic device, Ultrasound, Ionotophoresis '4mg'$ /ml Dexamethasone, and Manual  therapy   PLAN FOR NEXT SESSION: 6 weeks post op please advise if any precautions    [address] [place], [place], [address] Phone: [phone]   Fax:  [phone]  Patient Details  Name: QUINCEY QUESINBERRY MRN: [id number] Date of Birth: Sep 19, 1965 Referring Provider:  No ref. provider found  Encounter Date: 09/13/2022   Vickii Penna 09/13/2022, 3:10 PM  [address] [place], [place], [address] Phone:  [phone]   Fax:  [phone]             [facility] TREATMENT     Patient Name: TRIGO WINTERBOTTOM MRN: [id number] DOB:07/18/1966, 57 y.o., male Today's Date: 08/01/2022              Past Medical History:  Diagnosis Date   BCC (basal cell carcinoma of skin)     Hematospermia      hx of, remotely    Paroxysmal atrial fibrillation ([place]) 09/2017    saw cards   Pars defect of lumbar spine     PONV (postoperative nausea and vomiting)     Right knee DJD     Shingles 2014    face         Past Surgical History:  Procedure Laterality Date   ANKLE FRACTURE SURGERY Left 1996    plated   KNEE ARTHROSCOPY Left 3403    plica removed   KNEE ARTHROSCOPY Right      7 total   NASAL SEPTUM SURGERY       ORIF DISTAL RADIUS FRACTURE Right      age 72   REVISION TOTAL KNEE ARTHROPLASTY Right 08/19/2017   REVISION TOTAL SHOULDER TO REVERSE TOTAL SHOULDER Left 10/11/2021    Procedure: LEFT REVISION TOTAL SHOULDER TO REVERSE TOTAL SHOULDER, RIGHT SHOULDER STERIOD INJECTION;  Surgeon: Hiram Gash, MD;  Location: [facility];  Service: Orthopedics;  Laterality: Left;   SHOULDER ARTHROSCOPY W/ ROTATOR CUFF REPAIR Right 2011    bicep tendon and labrium repair   SHOULDER ARTHROSCOPY WITH ROTATOR CUFF REPAIR Left 2012    w/ labrium repair   TOTAL KNEE ARTHROPLASTY Right 10/12/2013    Procedure: RIGHT TOTAL KNEE ARTHROPLASTY;  Surgeon: Lorn Junes, MD;  Location: [facility];  Service: Orthopedics;  Laterality: Right;   VASECTOMY            Patient Active Problem List    Diagnosis Date Noted   BCC (basal cell carcinoma of skin) 06/29/2019   Chronic leukopenia 09/24/2018   PCP NOTES >>>>>>>>>>>>>> 09/24/2018   Anxiety 09/24/2018   Insomnia 09/24/2018   History of atrial fibrillation 09/24/2018   Irregular heart rhythm 02/24/2016   Dysfunction of both eustachian tubes 01/18/2016   DJD (degenerative joint disease) 10/12/2013   Pars defect of lumbar spine        PCP: N/A   REFERRING PROVIDER: Ophelia Charter, MD   REFERRING DIAG: R TSR   THERAPY DIAG:  Muscle weakness (generalized)   Abnormal posture   Acute pain of right shoulder   Stiffness of right shoulder, not elsewhere classified   Rationale for Evaluation and Treatment: Rehabilitation   ONSET DATE: 07/30/22   SUBJECTIVE:  SUBJECTIVE STATEMENT: Trying to stretch but very painful and a lot of spasms  PERTINENT HISTORY: PMHx-LTSR 12/21, L TSR revision 10/11/21, TKE and revision, basal cell, PAF   PAIN:  Are you having pain? Yes: NPRS scale: 7/10 Pain location: R shoulder Pain description: sharp, post op Aggravating factors: moving at night Relieving factors: Not much helps currently.   PRECAUTIONS: Shoulder   WEIGHT BEARING RESTRICTIONS: No   FALLS:  Has patient fallen in last 6 months? 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, farming   PLOF: Independent   PATIENT GOALS:Recover his ability to resume his normal activities   NEXT MD VISIT:    OBJECTIVE:    DIAGNOSTIC FINDINGS:  N/A   PATIENT SURVEYS:  FOTO 25   COGNITION: Overall cognitive status:  Within functional limits for tasks assessed                                  SENSATION: Not tested   POSTURE: Rounded shoulders   UPPER EXTREMITY ROM:    Passive ROM Right eval Left eval 08/30/22 RT shld supine  Shoulder flexion 71   132  Shoulder extension       Shoulder abduction     92  Shoulder adduction       Shoulder internal rotation 51   65  Shoulder external rotation 58   20  Elbow flexion       Elbow extension       Wrist flexion       Wrist extension       Wrist ulnar deviation       Wrist radial deviation       Wrist pronation       Wrist supination       (Blank rows = not tested)   UPPER EXTREMITY MMT: Strength testing of R shoulder deferred. Active movement in R elbow, wrist, hand/fingers     PALPATION:  Swelling noted throughout RUE from shoulder to hand. Ptient reports tenderness over incision.             TODAY'S TREATMENT:                                                                                                                                         DATE:   08/30/22 PROM/stretching RT UE various positions supine and sitting, tried pulleys, tried distraction pt very quarded, spams present and increased pain with abd and ER STW to RT shld to relax muscles,teres very tender. DN by MAlbright PT to teres Reviewed and corrected tech for pendulum and added 3# wt for distraction Mat slides- instructed to do at counter top at home  08/01/22 Eduction PROM to R shoulder in flex, ER/IR, very gentle  within tolerance.  Pendulum exercises.     PATIENT EDUCATION: Education details: POC, initial HEP Person educated: Patient and Spouse Education method: Customer service manager Education comprehension: verbalized understanding   HOME EXERCISE PROGRAM: [id number]   ASSESSMENT:   CLINICAL IMPRESSION: Patient is painful,guarded and spasms.Tried multi tech to be able to tolerate better. Corrected pendulum tech and showed for home and added table  slides. Increased freq to 2 x a week  OBJECTIVE IMPAIRMENTS: decreased mobility, decreased ROM, decreased strength, increased edema, increased muscle spasms, impaired flexibility, impaired sensation, impaired UE functional use, postural dysfunction, and pain.    ACTIVITY LIMITATIONS: carrying, lifting, bending, sleeping, bathing, toileting, dressing, reach over head, and hygiene/grooming   PARTICIPATION LIMITATIONS: meal prep, cleaning, laundry, interpersonal relationship, driving, shopping, community activity, occupation, and yard work   PERSONAL FACTORS: Past/current experiences are also affecting patient's functional outcome.    REHAB POTENTIAL: Good   CLINICAL DECISION MAKING: Evolving/moderate complexity   EVALUATION COMPLEXITY: Moderate     GOALS: Goals reviewed with patient? Yes   SHORT TERM GOALS: Target date: 08/22/22   I with initial HEP Baseline: Goal status: INITIAL   LONG TERM GOALS: Target date: 11/22/22   I with final HEP Baseline:  Goal status: met 08/30/22   2.  Increase R shoulder strength to at least 4/5 Baseline:  Goal status: INITIAL   3.  Increase R shoulder ROM to WNL, following appropriate protocol restrictions Baseline:  Goal status: INITIAL   4.  Patient will be able to complete all of his required daily activities with R shoulder pain < 3/10, following all appropriate restrictions Baseline:  Goal status: INITIAL   5.  Patient will be able to lift 10# weight overhead with good control, in RUE, pain < 3/10 Baseline:  Goal status: INITIAL 6.  Increase FOTO score to at least 59            Baseline: 25            Goal status: INITIAL PLAN:   PT FREQUENCY: 2x/week   PT DURATION: 12 weeks   PLANNED INTERVENTIONS: Therapeutic exercises, Therapeutic activity, Neuromuscular re-education, Balance training, Gait training, Patient/Family education, Self Care, Joint mobilization, Dry Needling, Electrical stimulation, Cryotherapy, Moist heat, Taping,  Vasopneumatic device, Ultrasound, Ionotophoresis '4mg'$ /ml Dexamethasone, and Manual therapy   PLAN FOR NEXT SESSION: pt to bring updated info from MD     [address] [place], [place], [address] Phone: [phone]   Fax:  [phone]  Patient Details  Name: GIFFORD BALLON MRN: [id number] Date of Birth: 12/12/1965 Referring Provider:  No ref. provider found  Encounter Date: 09/13/2022   Vickii Penna 09/13/2022, 3:10 PM  [address] [place], [place], [address] Phone: [phone]   Fax:  [address] [place], [place], [address] Phone: [phone]   Fax:  [phone]  Patient Details  Name: JAYON MATTON MRN: [id number] Date of Birth: 1966-03-31 Referring Provider:  No ref. provider found  Encounter Date: 09/13/2022   Vickii Penna 09/13/2022, 3:10 PM  [address] [place], [place], [address] Phone: [phone]   Fax:  [address]. [place], [place], [address] Phone: [phone]   Fax:  [phone]  Patient Details  Name: TAIVEN GREENLEY MRN: [id number] Date of Birth: 1965/09/20 Referring Provider:  No ref. provider found  Encounter Date: 09/13/2022   Vickii Penna 09/13/2022, 3:10 PM  [address] [place], [place], [address] Phone: [phone]   Fax:  [address]. [place], [place], [address] Phone: [phone]   Fax:  [phone]  Patient Details  Name: ANTONE SUMMONS MRN: [id number] Date of Birth: [birth year] Referring Provider:  No ref. provider found  Encounter Date:  09/13/2022   Vickii Penna 09/13/2022, 3:10 PM  [address] [place]. [place], [place], [address] Phone: [phone]   Fax:  [phone]

## 2022-09-18 ENCOUNTER — Ambulatory Visit: Payer: BC Managed Care – PPO | Admitting: Physical Therapy

## 2022-09-18 DIAGNOSIS — M25511 Pain in right shoulder: Secondary | ICD-10-CM | POA: Diagnosis not present

## 2022-09-18 DIAGNOSIS — M25611 Stiffness of right shoulder, not elsewhere classified: Secondary | ICD-10-CM

## 2022-09-18 DIAGNOSIS — M6281 Muscle weakness (generalized): Secondary | ICD-10-CM | POA: Diagnosis not present

## 2022-09-18 DIAGNOSIS — R293 Abnormal posture: Secondary | ICD-10-CM | POA: Diagnosis not present

## 2022-09-18 NOTE — Therapy (Signed)
OUTPATIENT PHYSICAL THERAPY SHOULDER TREATMENT     Patient Name: Bruce Cobb MRN: [id number] DOB:1965-11-28, 56 y.o., male Today's Date: 08/01/2022              Past Medical History:  Diagnosis Date   BCC (basal cell carcinoma of skin)     Hematospermia      hx of, remotely    Paroxysmal atrial fibrillation ([place]) 09/2017    saw cards   Pars defect of lumbar spine     PONV (postoperative nausea and vomiting)     Right knee DJD     Shingles 2014    face         Past Surgical History:  Procedure Laterality Date   ANKLE FRACTURE SURGERY Left 1996    plated   KNEE ARTHROSCOPY Left 3017    plica removed   KNEE ARTHROSCOPY Right      7 total   NASAL SEPTUM SURGERY       ORIF DISTAL RADIUS FRACTURE Right      age 18   REVISION TOTAL KNEE ARTHROPLASTY Right 08/19/2017   REVISION TOTAL SHOULDER TO REVERSE TOTAL SHOULDER Left 10/11/2021    Procedure: LEFT REVISION TOTAL SHOULDER TO REVERSE TOTAL SHOULDER, RIGHT SHOULDER STERIOD INJECTION;  Surgeon: Hiram Gash, MD;  Location: [facility];  Service: Orthopedics;  Laterality: Left;   SHOULDER ARTHROSCOPY W/ ROTATOR CUFF REPAIR Right 2011    bicep tendon and labrium repair   SHOULDER ARTHROSCOPY WITH ROTATOR CUFF REPAIR Left 2012    w/ labrium repair   TOTAL KNEE ARTHROPLASTY Right 10/12/2013    Procedure: RIGHT TOTAL KNEE ARTHROPLASTY;  Surgeon: Lorn Junes, MD;  Location: [facility];  Service: Orthopedics;  Laterality: Right;   VASECTOMY            Patient Active Problem List    Diagnosis Date Noted   BCC (basal cell carcinoma of skin) 06/29/2019   Chronic leukopenia 09/24/2018   PCP NOTES >>>>>>>>>>>>>> 09/24/2018   Anxiety 09/24/2018   Insomnia 09/24/2018   History of atrial fibrillation 09/24/2018   Irregular heart rhythm 02/24/2016   Dysfunction of both eustachian tubes 01/18/2016   DJD (degenerative joint disease) 10/12/2013   Pars defect of lumbar spine        PCP: N/A   REFERRING PROVIDER: Ophelia Charter, MD   REFERRING DIAG: R TSR   THERAPY DIAG:  Muscle weakness (generalized)   Abnormal posture   Acute pain of right shoulder   Stiffness of right shoulder, not elsewhere classified   Rationale for Evaluation and Treatment: Rehabilitation   ONSET DATE: 07/30/22   SUBJECTIVE:  SUBJECTIVE STATEMENT: Feeling better, easy use of mvmt. Much less catching PERTINENT HISTORY: PMHx-LTSR 12/21, L TSR revision 10/11/21, TKE and revision, basal cell, PAF   PAIN:  Are you having pain? Yes: NPRS scale: 3-7/10 Pain location: R shoulder Pain description: sharp, post op Aggravating factors: moving at night Relieving factors: Not much helps currently.   PRECAUTIONS: Shoulder   WEIGHT BEARING RESTRICTIONS: No   FALLS:  Has patient fallen in last 6 months? 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, farming   PLOF: Independent   PATIENT GOALS:Recover his ability to resume his normal activities   NEXT MD VISIT:    OBJECTIVE:    DIAGNOSTIC FINDINGS:  N/A   PATIENT SURVEYS:  FOTO 25   COGNITION: Overall cognitive status: Within functional limits for tasks assessed                                  SENSATION: Not tested   POSTURE: Rounded shoulders   UPPER EXTREMITY ROM:    Passive ROM Right eval Left eval 08/30/22 RT shld supine 09/06/21 RT shld supine 09/11/22 RT shld  Active /Passive Supine  Shoulder flexion 71   132 155 130/163  Shoulder extension         Shoulder abduction     92 102 90/110  Shoulder adduction         Shoulder internal rotation 51   65    Shoulder external rotation 58   20 32 45/52 (45 degrees abd)  Elbow flexion         Elbow extension         Wrist flexion          Wrist extension         Wrist ulnar deviation         Wrist radial deviation         Wrist pronation         Wrist supination         (Blank rows = not tested)   UPPER EXTREMITY MMT: Strength testing of R shoulder deferred. Active movement in R elbow, wrist, hand/fingers     PALPATION:  Swelling noted throughout RUE from shoulder to hand. Ptient reports tenderness over incision.             TODAY'S TREATMENT:                                                                                                                                          DATE:   09/18/22 UBE L 1 3 min fwd/ 3 min back Red tband shld ext and row 2 sets 10 Door wall slide flex, abd and ER 5 x 3 sec hold issued for HEP  Isometrics PROM and stretching with and without belt Shld mobs   09/11/22 Nustep L 3 push/pull 2 min PROM and stretching with and without belt mobs flex,abd and ER 5# shruggs, backward rolls and scap squeeze 10 x Red tband shld ext and row 15 x  2# wAte bar AA to increase ROM flex and abd 10 x each 2# bar chest press ,flex ,abd and chest press     09/04/22 UBE 3 min fwd/3 mn back L 1 Finger ladder Pulleys PTA assist with some catching PROM and stretching to RT shld sitting and supine  08/30/22 PROM/stretching RT UE various positions supine and sitting, tried pulleys, tried distraction pt very quarded, spams present and increased pain with abd and ER STW to RT shld to relax muscles,teres very tender. DN by MAlbright PT to teres Reviewed and corrected tech for pendulum and added 3# wt for distraction Mat slides- instructed to do at counter top at home  08/01/22 Eduction PROM to R shoulder in flex, ER/IR, very gentle within tolerance.  Pendulum exercises.     PATIENT EDUCATION: Education details: POC, initial HEP Person educated: Patient and Spouse Education method: Customer service manager Education comprehension: verbalized understanding   HOME EXERCISE  PROGRAM: [id number]   ASSESSMENT:   CLINICAL IMPRESSION:  Pt still with catching but did much better with belt mob stabilization OBJECTIVE IMPAIRMENTS: decreased mobility, decreased ROM, decreased strength, increased edema, increased muscle spasms, impaired flexibility, impaired sensation, impaired UE functional use, postural dysfunction, and pain.    ACTIVITY LIMITATIONS: carrying, lifting, bending, sleeping, bathing, toileting, dressing, reach over head, and hygiene/grooming   PARTICIPATION LIMITATIONS: meal prep, cleaning, laundry, interpersonal relationship, driving, shopping, community activity, occupation, and yard work   PERSONAL FACTORS: Past/current experiences are also affecting patient's functional outcome.    REHAB POTENTIAL: Good   CLINICAL DECISION MAKING: Evolving/moderate complexity   EVALUATION COMPLEXITY: Moderate     GOALS: Goals reviewed with patient? Yes   SHORT TERM GOALS: Target date: 08/22/22   I with initial HEP Baseline: Goal status: INITIAL   LONG TERM GOALS: Target date: 11/22/22   I with final HEP Baseline:  Goal status: met 08/30/22   2.  Increase R shoulder strength to at least 4/5 Baseline:  Goal status: INITIAL   3.  Increase R shoulder ROM to WNL, following appropriate protocol restrictions Baseline:  Goal status: INITIAL   4.  Patient will be able to complete all of his required daily activities with R shoulder pain < 3/10, following all appropriate restrictions Baseline:  Goal status: INITIAL   5.  Patient will be able to lift 10# weight overhead with good control, in RUE, pain < 3/10 Baseline:  Goal status: INITIAL 6.  Increase FOTO score to at least 59            Baseline: 25            Goal status: INITIAL PLAN:   PT FREQUENCY: 2x/week   PT DURATION: 12 weeks   PLANNED INTERVENTIONS: Therapeutic exercises, Therapeutic activity, Neuromuscular re-education, Balance training, Gait training, Patient/Family education, Self  Care, Joint mobilization, Dry Needling, Electrical stimulation, Cryotherapy, Moist heat, Taping, Vasopneumatic device, Ultrasound, Ionotophoresis '4mg'$ /ml Dexamethasone, and Manual therapy   PLAN FOR NEXT SESSION: check ROM and goals  Franki Monte PTA    [place]. [place], [place], [address] Phone: [phone]   Fax:  [phone]  Patient Details  Name: Bruce Cobb MRN: [id number] Date of  Birth: [birth year] Referring Provider:  No ref. provider found  Encounter Date: 09/18/2022   Bruce Cobb 09/18/2022, 12:33 PM  [place]. [place], [place], [address] Phone: [phone]   Fax:  [phone]             OUTPATIENT PHYSICAL THERAPY SHOULDER TREATMENT     Patient Name: Bruce Cobb MRN: [id number] DOB:01-05-1966, 57 y.o., male Today's Date: 08/01/2022              Past Medical History:  Diagnosis Date   BCC (basal cell carcinoma of skin)     Hematospermia      hx of, remotely    Paroxysmal atrial fibrillation ([place]) 09/2017    saw cards   Pars defect of lumbar spine     PONV (postoperative nausea and vomiting)     Right knee DJD     Shingles 2014    face         Past Surgical History:  Procedure Laterality Date   ANKLE FRACTURE SURGERY Left 1996    plated   KNEE ARTHROSCOPY Left 7867    plica removed   KNEE ARTHROSCOPY Right      7 total   NASAL SEPTUM SURGERY       ORIF DISTAL RADIUS FRACTURE Right      age 34   REVISION TOTAL KNEE ARTHROPLASTY Right 08/19/2017   REVISION TOTAL SHOULDER TO REVERSE TOTAL SHOULDER Left 10/11/2021    Procedure: LEFT REVISION TOTAL SHOULDER TO REVERSE TOTAL SHOULDER, RIGHT SHOULDER STERIOD INJECTION;  Surgeon: Hiram Gash, MD;  Location: [facility];  Service: Orthopedics;  Laterality: Left;   SHOULDER ARTHROSCOPY W/ ROTATOR CUFF REPAIR Right 2011    bicep tendon and labrium repair   SHOULDER  ARTHROSCOPY WITH ROTATOR CUFF REPAIR Left 2012    w/ labrium repair   TOTAL KNEE ARTHROPLASTY Right 10/12/2013    Procedure: RIGHT TOTAL KNEE ARTHROPLASTY;  Surgeon: Lorn Junes, MD;  Location: [facility];  Service: Orthopedics;  Laterality: Right;   VASECTOMY            Patient Active Problem List    Diagnosis Date Noted   BCC (basal cell carcinoma of skin) 06/29/2019   Chronic leukopenia 09/24/2018   PCP NOTES >>>>>>>>>>>>>> 09/24/2018   Anxiety 09/24/2018   Insomnia 09/24/2018   History of atrial fibrillation 09/24/2018   Irregular heart rhythm 02/24/2016   Dysfunction of both eustachian tubes 01/18/2016   DJD (degenerative joint disease) 10/12/2013   Pars defect of lumbar spine        PCP: N/A   REFERRING PROVIDER: Ophelia Charter, MD   REFERRING DIAG: R TSR   THERAPY DIAG:  Muscle weakness (generalized)   Abnormal posture   Acute pain of right shoulder   Stiffness of right shoulder, not elsewhere classified   Rationale for Evaluation and Treatment: Rehabilitation   ONSET DATE: 07/30/22   SUBJECTIVE:  SUBJECTIVE STATEMENT: Trying to stretch but very painful and a lot of spasms  PERTINENT HISTORY: PMHx-LTSR 12/21, L TSR revision 10/11/21, TKE and revision, basal cell, PAF   PAIN:  Are you having pain? Yes: NPRS scale: 7/10 Pain location: R shoulder Pain description: sharp, post op Aggravating factors: moving at night Relieving factors: Not much helps currently.   PRECAUTIONS: Shoulder   WEIGHT BEARING RESTRICTIONS: No   FALLS:  Has patient fallen in last 6 months? 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, farming   PLOF: Independent   PATIENT GOALS:Recover his ability  to resume his normal activities   NEXT MD VISIT:    OBJECTIVE:    DIAGNOSTIC FINDINGS:  N/A   PATIENT SURVEYS:  FOTO 25   COGNITION: Overall cognitive status: Within functional limits for tasks assessed                                  SENSATION: Not tested   POSTURE: Rounded shoulders   UPPER EXTREMITY ROM:    Passive ROM Right eval Left eval 08/30/22 RT shld supine  Shoulder flexion 71   132  Shoulder extension       Shoulder abduction     92  Shoulder adduction       Shoulder internal rotation 51   65  Shoulder external rotation 58   20  Elbow flexion       Elbow extension       Wrist flexion       Wrist extension       Wrist ulnar deviation       Wrist radial deviation       Wrist pronation       Wrist supination       (Blank rows = not tested)   UPPER EXTREMITY MMT: Strength testing of R shoulder deferred. Active movement in R elbow, wrist, hand/fingers     PALPATION:  Swelling noted throughout RUE from shoulder to hand. Ptient reports tenderness over incision.             TODAY'S TREATMENT:                                                                                                                                         DATE:   08/30/22 PROM/stretching RT UE various positions supine and sitting, tried pulleys, tried distraction pt very quarded, spams present and increased pain with abd and ER STW to RT shld to relax muscles,teres very tender. DN by MAlbright PT to teres Reviewed and corrected tech for pendulum and added 3# wt for distraction Mat slides- instructed to do at counter top at home  08/01/22 Eduction PROM to R shoulder in flex, ER/IR, very gentle  within tolerance.  Pendulum exercises.     PATIENT EDUCATION: Education details: POC, initial HEP Person educated: Patient and Spouse Education method: Customer service manager Education comprehension: verbalized understanding   HOME EXERCISE PROGRAM: [id number]   ASSESSMENT:    CLINICAL IMPRESSION: Patient is painful,guarded and spasms.Tried multi tech to be able to tolerate better. Corrected pendulum tech and showed for home and added table slides. Increased freq to 2 x a week  OBJECTIVE IMPAIRMENTS: decreased mobility, decreased ROM, decreased strength, increased edema, increased muscle spasms, impaired flexibility, impaired sensation, impaired UE functional use, postural dysfunction, and pain.    ACTIVITY LIMITATIONS: carrying, lifting, bending, sleeping, bathing, toileting, dressing, reach over head, and hygiene/grooming   PARTICIPATION LIMITATIONS: meal prep, cleaning, laundry, interpersonal relationship, driving, shopping, community activity, occupation, and yard work   PERSONAL FACTORS: Past/current experiences are also affecting patient's functional outcome.    REHAB POTENTIAL: Good   CLINICAL DECISION MAKING: Evolving/moderate complexity   EVALUATION COMPLEXITY: Moderate     GOALS: Goals reviewed with patient? Yes   SHORT TERM GOALS: Target date: 08/22/22   I with initial HEP Baseline: Goal status: INITIAL   LONG TERM GOALS: Target date: 11/22/22   I with final HEP Baseline:  Goal status: met 08/30/22   2.  Increase R shoulder strength to at least 4/5 Baseline:  Goal status: INITIAL   3.  Increase R shoulder ROM to WNL, following appropriate protocol restrictions Baseline:  Goal status: INITIAL   4.  Patient will be able to complete all of his required daily activities with R shoulder pain < 3/10, following all appropriate restrictions Baseline:  Goal status: INITIAL   5.  Patient will be able to lift 10# weight overhead with good control, in RUE, pain < 3/10 Baseline:  Goal status: INITIAL 6.  Increase FOTO score to at least 59            Baseline: 25            Goal status: INITIAL PLAN:   PT FREQUENCY: 2x/week   PT DURATION: 12 weeks   PLANNED INTERVENTIONS: Therapeutic exercises, Therapeutic activity, Neuromuscular  re-education, Balance training, Gait training, Patient/Family education, Self Care, Joint mobilization, Dry Needling, Electrical stimulation, Cryotherapy, Moist heat, Taping, Vasopneumatic device, Ultrasound, Ionotophoresis '4mg'$ /ml Dexamethasone, and Manual therapy   PLAN FOR NEXT SESSION: pt to bring updated info from MD     [place]. [place], [place], [address] Phone: [phone]   Fax:  [phone]  Patient Details  Name: Bruce Cobb MRN: [id number] Date of Birth: 12-Apr-1966 Referring Provider:  No ref. provider found  Encounter Date: 09/18/2022   Bruce Cobb 09/18/2022, 12:33 PM  [place]. [place], [place], [address] Phone: [phone]   Fax:  [place]. [place], [place], [address] Phone: [phone]   Fax:  [phone]  Patient Details  Name: Bruce Cobb MRN: [id number] Date of Birth: 1966-06-30 Referring Provider:  No ref. provider found  Encounter Date: 09/18/2022   Bruce Cobb 09/18/2022, 12:33 PM  [place]. [place], [place], [address] Phone: [phone]   Fax:  [place]. [place], [place], [address] Phone: [phone]   Fax:  [phone]  Patient Details  Name: Bruce Cobb MRN: [id number] Date of Birth: 1966/03/02 Referring Provider:  No ref. provider found  Encounter Date: 09/18/2022   Bruce Cobb 09/18/2022, 12:33 PM  [place]. [place], [place], [address] Phone: [phone]   Fax:  [place]. [place], [place], [address] Phone:  [phone]   Fax:  [phone]  Patient Details  Name: Bruce Cobb MRN: [id number] Date of Birth: 14-Jun-1966 Referring Provider:  No ref. provider found  Encounter Date: 09/18/2022   Bruce Cobb 09/18/2022, 12:33 PM  [place]. [place], [place], [address] Phone: [phone]   Fax:  [place]. [place], [place], [address] Phone: [phone]   Fax:  [phone]  Patient Details  Name: Bruce Cobb MRN: [id number] Date of Birth: September 18, 1965 Referring Provider:  No ref. provider found  Encounter Date: 09/18/2022   Bruce Cobb 09/18/2022, 12:33 PM  [place]. [place], [place], [address] Phone: [phone]   Fax:  [phone]

## 2022-09-20 ENCOUNTER — Ambulatory Visit: Payer: BC Managed Care – PPO | Admitting: Physical Therapy

## 2022-09-20 DIAGNOSIS — M25611 Stiffness of right shoulder, not elsewhere classified: Secondary | ICD-10-CM

## 2022-09-20 DIAGNOSIS — M6281 Muscle weakness (generalized): Secondary | ICD-10-CM | POA: Diagnosis not present

## 2022-09-20 DIAGNOSIS — R293 Abnormal posture: Secondary | ICD-10-CM | POA: Diagnosis not present

## 2022-09-20 DIAGNOSIS — M25511 Pain in right shoulder: Secondary | ICD-10-CM | POA: Diagnosis not present

## 2022-09-20 NOTE — Therapy (Signed)
OUTPATIENT PHYSICAL THERAPY SHOULDER TREATMENT     Patient Name: Bruce Cobb MRN: [id number] DOB:1965-11-28, 56 y.o., male Today's Date: 08/01/2022              Past Medical History:  Diagnosis Date   BCC (basal cell carcinoma of skin)     Hematospermia      hx of, remotely    Paroxysmal atrial fibrillation ([place]) 09/2017    saw cards   Pars defect of lumbar spine     PONV (postoperative nausea and vomiting)     Right knee DJD     Shingles 2014    face         Past Surgical History:  Procedure Laterality Date   ANKLE FRACTURE SURGERY Left 1996    plated   KNEE ARTHROSCOPY Left 3017    plica removed   KNEE ARTHROSCOPY Right      7 total   NASAL SEPTUM SURGERY       ORIF DISTAL RADIUS FRACTURE Right      age 18   REVISION TOTAL KNEE ARTHROPLASTY Right 08/19/2017   REVISION TOTAL SHOULDER TO REVERSE TOTAL SHOULDER Left 10/11/2021    Procedure: LEFT REVISION TOTAL SHOULDER TO REVERSE TOTAL SHOULDER, RIGHT SHOULDER STERIOD INJECTION;  Surgeon: Hiram Gash, MD;  Location: [facility];  Service: Orthopedics;  Laterality: Left;   SHOULDER ARTHROSCOPY W/ ROTATOR CUFF REPAIR Right 2011    bicep tendon and labrium repair   SHOULDER ARTHROSCOPY WITH ROTATOR CUFF REPAIR Left 2012    w/ labrium repair   TOTAL KNEE ARTHROPLASTY Right 10/12/2013    Procedure: RIGHT TOTAL KNEE ARTHROPLASTY;  Surgeon: Lorn Junes, MD;  Location: [facility];  Service: Orthopedics;  Laterality: Right;   VASECTOMY            Patient Active Problem List    Diagnosis Date Noted   BCC (basal cell carcinoma of skin) 06/29/2019   Chronic leukopenia 09/24/2018   PCP NOTES >>>>>>>>>>>>>> 09/24/2018   Anxiety 09/24/2018   Insomnia 09/24/2018   History of atrial fibrillation 09/24/2018   Irregular heart rhythm 02/24/2016   Dysfunction of both eustachian tubes 01/18/2016   DJD (degenerative joint disease) 10/12/2013   Pars defect of lumbar spine        PCP: N/A   REFERRING PROVIDER: Ophelia Charter, MD   REFERRING DIAG: R TSR   THERAPY DIAG:  Muscle weakness (generalized)   Abnormal posture   Acute pain of right shoulder   Stiffness of right shoulder, not elsewhere classified   Rationale for Evaluation and Treatment: Rehabilitation   ONSET DATE: 07/30/22   SUBJECTIVE:  SUBJECTIVE STATEMENT: Feeling better, easy use of mvmt. Much less catching. Wife present to review PROM PERTINENT HISTORY: PMHx-LTSR 12/21, L TSR revision 10/11/21, TKE and revision, basal cell, PAF   PAIN:  Are you having pain? Yes: NPRS scale: 3-7/10 Pain location: R shoulder Pain description: sharp, post op Aggravating factors: moving at night Relieving factors: Not much helps currently.   PRECAUTIONS: Shoulder   WEIGHT BEARING RESTRICTIONS: No   FALLS:  Has patient fallen in last 6 months? 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, farming   PLOF: Independent   PATIENT GOALS:Recover his ability to resume his normal activities   NEXT MD VISIT:    OBJECTIVE:    DIAGNOSTIC FINDINGS:  N/A   PATIENT SURVEYS:  FOTO 25   COGNITION: Overall cognitive status: Within functional limits for tasks assessed                                  SENSATION: Not tested   POSTURE: Rounded shoulders   UPPER EXTREMITY ROM:    Passive ROM Right eval Left eval 08/30/22 RT shld supine 09/06/21 RT shld supine 09/11/22 RT shld  Active /Passive Supine 09/20/22 RT shld supine Act/Passive  Shoulder flexion 71   132 155 130/163 150/170  Shoulder extension          Shoulder abduction     92 102 90/110 115/133  Shoulder adduction          Shoulder internal rotation 51   65     Shoulder external rotation 58   20 32  45/52 (45 degrees abd) 45/52(45 degrees abd)  Elbow flexion          Elbow extension          Wrist flexion          Wrist extension          Wrist ulnar deviation          Wrist radial deviation          Wrist pronation          Wrist supination          (Blank rows = not tested)   UPPER EXTREMITY MMT: Strength testing of R shoulder deferred. Active movement in R elbow, wrist, hand/fingers     PALPATION:  Swelling noted throughout RUE from shoulder to hand. Ptient reports tenderness over incision.             TODAY'S TREATMENT:                                                                                                                                          DATE:   09/20/22 UBE 3 min fwd/3 min back Red  tband stab ex 15 x ext and row, 10 x IR ( limited ROM)/ER 3# backward rolls with cuing 2# cane ex standing flex and chest press Shld mobs supine and seate PROM flex,abd and ER with educ to wife as they are going on vacation Belt mobs flex and abd  09/18/22 UBE L 1 3 min fwd/ 3 min back Red tband shld ext and row 2 sets 10 Door wall slide flex, abd and ER 5 x 3 sec hold issued for HEP Isometrics PROM and stretching with and without belt Shld mobs   09/11/22 Nustep L 3 push/pull 2 min PROM and stretching with and without belt mobs flex,abd and ER 5# shruggs, backward rolls and scap squeeze 10 x Red tband shld ext and row 15 x  2# wAte bar AA to increase ROM flex and abd 10 x each 2# bar chest press ,flex ,abd and chest press     09/04/22 UBE 3 min fwd/3 mn back L 1 Finger ladder Pulleys PTA assist with some catching PROM and stretching to RT shld sitting and supine  08/30/22 PROM/stretching RT UE various positions supine and sitting, tried pulleys, tried distraction pt very quarded, spams present and increased pain with abd and ER STW to RT shld to relax muscles,teres very tender. DN by MAlbright PT to teres Reviewed and corrected tech for pendulum and added 3#  wt for distraction Mat slides- instructed to do at counter top at home  08/01/22 Eduction PROM to R shoulder in flex, ER/IR, very gentle within tolerance.  Pendulum exercises.     PATIENT EDUCATION: Education details: POC, initial HEP Person educated: Patient and Spouse Education method: Customer service manager Education comprehension: verbalized understanding   HOME EXERCISE PROGRAM: [id number]   ASSESSMENT:   CLINICAL IMPRESSION: checked ROM. MT and educto wife on MT. Educ on water mvmt for vacation Pt still with catching but did much better with belt mob stabilization OBJECTIVE IMPAIRMENTS: decreased mobility, decreased ROM, decreased strength, increased edema, increased muscle spasms, impaired flexibility, impaired sensation, impaired UE functional use, postural dysfunction, and pain.    ACTIVITY LIMITATIONS: carrying, lifting, bending, sleeping, bathing, toileting, dressing, reach over head, and hygiene/grooming   PARTICIPATION LIMITATIONS: meal prep, cleaning, laundry, interpersonal relationship, driving, shopping, community activity, occupation, and yard work   PERSONAL FACTORS: Past/current experiences are also affecting patient's functional outcome.    REHAB POTENTIAL: Good   CLINICAL DECISION MAKING: Evolving/moderate complexity   EVALUATION COMPLEXITY: Moderate     GOALS: Goals reviewed with patient? Yes   SHORT TERM GOALS: Target date: 08/22/22   I with initial HEP Baseline: Goal status: 09/20/22 met   LONG TERM GOALS: Target date: 11/22/22   I with final HEP Baseline:  Goal status: met 08/30/22   2.  Increase R shoulder strength to at least 4/5 Baseline:  Goal status: INITIAL   3.  Increase R shoulder ROM to WNL, following appropriate protocol restrictions Baseline:  Goal status: INITIAL   4.  Patient will be able to complete all of his required daily activities with R shoulder pain < 3/10, following all appropriate restrictions Baseline:   Goal status: INITIAL   5.  Patient will be able to lift 10# weight overhead with good control, in RUE, pain < 3/10 Baseline:  Goal status: INITIAL 6.  Increase FOTO score to at least 59            Baseline: 25            Goal status: INITIAL  PLAN:   PT FREQUENCY: 2x/week   PT DURATION: 12 weeks   PLANNED INTERVENTIONS: Therapeutic exercises, Therapeutic activity, Neuromuscular re-education, Balance training, Gait training, Patient/Family education, Self Care, Joint mobilization, Dry Needling, Electrical stimulation, Cryotherapy, Moist heat, Taping, Vasopneumatic device, Ultrasound, Ionotophoresis '4mg'$ /ml Dexamethasone, and Manual therapy   PLAN FOR NEXT SESSION: progress  Franki Monte PTA    [place]. [place], [place], [address] Phone: [phone]   Fax:  [place]. [place], [place], [address] Phone: [phone]   Fax:  [phone]

## 2022-09-25 ENCOUNTER — Ambulatory Visit: Payer: BC Managed Care – PPO | Admitting: Physical Therapy

## 2022-09-25 DIAGNOSIS — R293 Abnormal posture: Secondary | ICD-10-CM

## 2022-09-25 DIAGNOSIS — M25611 Stiffness of right shoulder, not elsewhere classified: Secondary | ICD-10-CM | POA: Diagnosis not present

## 2022-09-25 DIAGNOSIS — M6281 Muscle weakness (generalized): Secondary | ICD-10-CM | POA: Diagnosis not present

## 2022-09-25 DIAGNOSIS — M25511 Pain in right shoulder: Secondary | ICD-10-CM | POA: Diagnosis not present

## 2022-09-25 NOTE — Therapy (Signed)
OUTPATIENT PHYSICAL THERAPY SHOULDER TREATMENT     Patient Name: Bruce Cobb MRN: [id number] DOB:1965-11-28, 57 y.o., male Today's Date: 08/01/2022              Past Medical History:  Diagnosis Date   BCC (basal cell carcinoma of skin)     Hematospermia      hx of, remotely    Paroxysmal atrial fibrillation ([place]) 09/2017    saw cards   Pars defect of lumbar spine     PONV (postoperative nausea and vomiting)     Right knee DJD     Shingles 2014    face         Past Surgical History:  Procedure Laterality Date   ANKLE FRACTURE SURGERY Left 1996    plated   KNEE ARTHROSCOPY Left 3017    plica removed   KNEE ARTHROSCOPY Right      7 total   NASAL SEPTUM SURGERY       ORIF DISTAL RADIUS FRACTURE Right      age 18   REVISION TOTAL KNEE ARTHROPLASTY Right 08/19/2017   REVISION TOTAL SHOULDER TO REVERSE TOTAL SHOULDER Left 10/11/2021    Procedure: LEFT REVISION TOTAL SHOULDER TO REVERSE TOTAL SHOULDER, RIGHT SHOULDER STERIOD INJECTION;  Surgeon: Hiram Gash, MD;  Location: [facility];  Service: Orthopedics;  Laterality: Left;   SHOULDER ARTHROSCOPY W/ ROTATOR CUFF REPAIR Right 2011    bicep tendon and labrium repair   SHOULDER ARTHROSCOPY WITH ROTATOR CUFF REPAIR Left 2012    w/ labrium repair   TOTAL KNEE ARTHROPLASTY Right 10/12/2013    Procedure: RIGHT TOTAL KNEE ARTHROPLASTY;  Surgeon: Lorn Junes, MD;  Location: [facility];  Service: Orthopedics;  Laterality: Right;   VASECTOMY            Patient Active Problem List    Diagnosis Date Noted   BCC (basal cell carcinoma of skin) 06/29/2019   Chronic leukopenia 09/24/2018   PCP NOTES >>>>>>>>>>>>>> 09/24/2018   Anxiety 09/24/2018   Insomnia 09/24/2018   History of atrial fibrillation 09/24/2018   Irregular heart rhythm 02/24/2016   Dysfunction of both eustachian tubes 01/18/2016   DJD (degenerative joint disease) 10/12/2013   Pars defect of lumbar spine        PCP: N/A   REFERRING PROVIDER: Ophelia Charter, MD   REFERRING DIAG: R TSR   THERAPY DIAG:  Muscle weakness (generalized)   Abnormal posture   Acute pain of right shoulder   Stiffness of right shoulder, not elsewhere classified   Rationale for Evaluation and Treatment: Rehabilitation   ONSET DATE: 07/30/22   SUBJECTIVE:  SUBJECTIVE STATEMENT: Sore, but catching is better PERTINENT HISTORY: PMHx-LTSR 12/21, L TSR revision 10/11/21, TKE and revision, basal cell, PAF   PAIN:  Are you having pain? Yes: NPRS scale: 3-5/10 Pain location: R shoulder Pain description: sharp, post op Aggravating factors: moving at night Relieving factors: Not much helps currently.   PRECAUTIONS: Shoulder   WEIGHT BEARING RESTRICTIONS: No   FALLS:  Has patient fallen in last 6 months? 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, farming   PLOF: Independent   PATIENT GOALS:Recover his ability to resume his normal activities   NEXT MD VISIT:    OBJECTIVE:    DIAGNOSTIC FINDINGS:  N/A   PATIENT SURVEYS:  FOTO 25   COGNITION: Overall cognitive status: Within functional limits for tasks assessed                                  SENSATION: Not tested   POSTURE: Rounded shoulders   UPPER EXTREMITY ROM:    Passive ROM Right eval Left eval 08/30/22 RT shld supine 09/06/21 RT shld supine 09/11/22 RT shld  Active /Passive Supine 09/20/22 RT shld supine Act/Passive  Shoulder flexion 71   132 155 130/163 150/170  Shoulder extension          Shoulder abduction     92 102 90/110 115/133  Shoulder adduction          Shoulder internal rotation 51   65     Shoulder external rotation 58   20 32 45/52 (45 degrees abd) 45/52(45 degrees abd)  Elbow  flexion          Elbow extension          Wrist flexion          Wrist extension          Wrist ulnar deviation          Wrist radial deviation          Wrist pronation          Wrist supination          (Blank rows = not tested)   UPPER EXTREMITY MMT: Strength testing of R shoulder deferred. Active movement in R elbow, wrist, hand/fingers     PALPATION:  Swelling noted throughout RUE from shoulder to hand. Ptient reports tenderness over incision.             TODAY'S TREATMENT:                                                                                                                                          DATE:   09/25/22 UBE L 3 3 min each way Red tband scap stab 15 x each Seated row  4# 2 sets 10 Standing shld ext 4# 2 sets 10 Supine shld ex 2#- cued to stab scap with mvmt PROM   09/20/22 UBE 3 min fwd/3 min back Red tband stab ex 15 x ext and row, 10 x IR ( limited ROM)/ER 3# backward rolls with cuing 2# cane ex standing flex and chest press Shld mobs supine and seate PROM flex,abd and ER with educ to wife as they are going on vacation Belt mobs flex and abd  09/18/22 UBE L 1 3 min fwd/ 3 min back Red tband shld ext and row 2 sets 10 Door wall slide flex, abd and ER 5 x 3 sec hold issued for HEP Isometrics PROM and stretching with and without belt Shld mobs   09/11/22 Nustep L 3 push/pull 2 min PROM and stretching with and without belt mobs flex,abd and ER 5# shruggs, backward rolls and scap squeeze 10 x Red tband shld ext and row 15 x  2# wAte bar AA to increase ROM flex and abd 10 x each 2# bar chest press ,flex ,abd and chest press     09/04/22 UBE 3 min fwd/3 mn back L 1 Finger ladder Pulleys PTA assist with some catching PROM and stretching to RT shld sitting and supine  08/30/22 PROM/stretching RT UE various positions supine and sitting, tried pulleys, tried distraction pt very quarded, spams present and increased pain with abd and ER STW to  RT shld to relax muscles,teres very tender. DN by MAlbright PT to teres Reviewed and corrected tech for pendulum and added 3# wt for distraction Mat slides- instructed to do at counter top at home  08/01/22 Eduction PROM to R shoulder in flex, ER/IR, very gentle within tolerance.  Pendulum exercises.     PATIENT EDUCATION: Education details: POC, initial HEP Person educated: Patient and Spouse Education method: Customer service manager Education comprehension: verbalized understanding   HOME EXERCISE PROGRAM: [id number]   ASSESSMENT:   CLINICAL IMPRESSION: progressed ex with cuing to squeeze scap to  stab with mvmt to decrease catch esp with eccentric mvmt OBJECTIVE IMPAIRMENTS: decreased mobility, decreased ROM, decreased strength, increased edema, increased muscle spasms, impaired flexibility, impaired sensation, impaired UE functional use, postural dysfunction, and pain.    ACTIVITY LIMITATIONS: carrying, lifting, bending, sleeping, bathing, toileting, dressing, reach over head, and hygiene/grooming   PARTICIPATION LIMITATIONS: meal prep, cleaning, laundry, interpersonal relationship, driving, shopping, community activity, occupation, and yard work   PERSONAL FACTORS: Past/current experiences are also affecting patient's functional outcome.    REHAB POTENTIAL: Good   CLINICAL DECISION MAKING: Evolving/moderate complexity   EVALUATION COMPLEXITY: Moderate     GOALS: Goals reviewed with patient? Yes   SHORT TERM GOALS: Target date: 08/22/22   I with initial HEP Baseline: Goal status: 09/20/22 met   LONG TERM GOALS: Target date: 11/22/22   I with final HEP Baseline:  Goal status: met 08/30/22   2.  Increase R shoulder strength to at least 4/5 Baseline:  Goal status: INITIAL   3.  Increase R shoulder ROM to WNL, following appropriate protocol restrictions Baseline:  Goal status: INITIAL   4.  Patient will be able to complete all of his required daily  activities with R shoulder pain < 3/10, following all appropriate restrictions Baseline:  Goal status: INITIAL   5.  Patient will be able to lift 10# weight overhead with good control, in RUE, pain < 3/10 Baseline:  Goal status: INITIAL 6.  Increase FOTO score to at least 59  Baseline: 25            Goal status: INITIAL PLAN:   PT FREQUENCY: 2x/week   PT DURATION: 12 weeks   PLANNED INTERVENTIONS: Therapeutic exercises, Therapeutic activity, Neuromuscular re-education, Balance training, Gait training, Patient/Family education, Self Care, Joint mobilization, Dry Needling, Electrical stimulation, Cryotherapy, Moist heat, Taping, Vasopneumatic device, Ultrasound, Ionotophoresis '4mg'$ /ml Dexamethasone, and Manual therapy   PLAN FOR NEXT SESSION: progress  Armed forces operational officer PTA    [facility] at [address]. [place], [place], [address] Phone: [phone]   Fax:  [facility] at [place]. [place], [place], [address] Phone: [phone]   Fax:  [facility] at [place] [place], [place], [address] Phone: [phone]   Fax:  [phone]  Patient Details  Name: DEARIES MEIKLE MRN: [id number] Date of Birth: 10-May-1966 Referring Provider:  No ref. provider found  Encounter Date: 09/25/2022   Vickii Penna 09/25/2022, 12:38 PM  [facility] at [place]. [place], [place], [address] Phone: [phone]   Fax:  [phone]

## 2022-09-27 ENCOUNTER — Ambulatory Visit: Payer: BC Managed Care – PPO | Admitting: Physical Therapy

## 2022-10-02 ENCOUNTER — Ambulatory Visit: Payer: BC Managed Care – PPO | Admitting: Physical Therapy

## 2022-10-04 ENCOUNTER — Ambulatory Visit: Payer: BC Managed Care – PPO | Admitting: Physical Therapy

## 2022-10-09 ENCOUNTER — Ambulatory Visit: Payer: BC Managed Care – PPO | Attending: Orthopaedic Surgery | Admitting: Physical Therapy

## 2022-10-09 DIAGNOSIS — M6281 Muscle weakness (generalized): Secondary | ICD-10-CM | POA: Diagnosis not present

## 2022-10-09 DIAGNOSIS — M25511 Pain in right shoulder: Secondary | ICD-10-CM | POA: Diagnosis not present

## 2022-10-09 DIAGNOSIS — M25611 Stiffness of right shoulder, not elsewhere classified: Secondary | ICD-10-CM | POA: Diagnosis not present

## 2022-10-09 NOTE — Therapy (Signed)
OUTPATIENT PHYSICAL THERAPY SHOULDER TREATMENT     Patient Name: Bruce Cobb MRN: [id number] DOB:1965-11-28, 56 y.o., male Today's Date: 08/01/2022              Past Medical History:  Diagnosis Date   BCC (basal cell carcinoma of skin)     Hematospermia      hx of, remotely    Paroxysmal atrial fibrillation ([place]) 09/2017    saw cards   Pars defect of lumbar spine     PONV (postoperative nausea and vomiting)     Right knee DJD     Shingles 2014    face         Past Surgical History:  Procedure Laterality Date   ANKLE FRACTURE SURGERY Left 1996    plated   KNEE ARTHROSCOPY Left 3017    plica removed   KNEE ARTHROSCOPY Right      7 total   NASAL SEPTUM SURGERY       ORIF DISTAL RADIUS FRACTURE Right      age 18   REVISION TOTAL KNEE ARTHROPLASTY Right 08/19/2017   REVISION TOTAL SHOULDER TO REVERSE TOTAL SHOULDER Left 10/11/2021    Procedure: LEFT REVISION TOTAL SHOULDER TO REVERSE TOTAL SHOULDER, RIGHT SHOULDER STERIOD INJECTION;  Surgeon: Hiram Gash, MD;  Location: [facility];  Service: Orthopedics;  Laterality: Left;   SHOULDER ARTHROSCOPY W/ ROTATOR CUFF REPAIR Right 2011    bicep tendon and labrium repair   SHOULDER ARTHROSCOPY WITH ROTATOR CUFF REPAIR Left 2012    w/ labrium repair   TOTAL KNEE ARTHROPLASTY Right 10/12/2013    Procedure: RIGHT TOTAL KNEE ARTHROPLASTY;  Surgeon: Lorn Junes, MD;  Location: [facility];  Service: Orthopedics;  Laterality: Right;   VASECTOMY            Patient Active Problem List    Diagnosis Date Noted   BCC (basal cell carcinoma of skin) 06/29/2019   Chronic leukopenia 09/24/2018   PCP NOTES >>>>>>>>>>>>>> 09/24/2018   Anxiety 09/24/2018   Insomnia 09/24/2018   History of atrial fibrillation 09/24/2018   Irregular heart rhythm 02/24/2016   Dysfunction of both eustachian tubes 01/18/2016   DJD (degenerative joint disease) 10/12/2013   Pars defect of lumbar spine        PCP: N/A   REFERRING PROVIDER: Ophelia Charter, MD   REFERRING DIAG: R TSR   THERAPY DIAG:  Muscle weakness (generalized)   Abnormal posture   Acute pain of right shoulder   Stiffness of right shoulder, not elsewhere classified   Rationale for Evaluation and Treatment: Rehabilitation   ONSET DATE: 07/30/22   SUBJECTIVE:  SUBJECTIVE STATEMENT: Pt arrives back after a week of vacation and states feeling better but still some bicep issues and catching,he is 10 wks today PERTINENT HISTORY: PMHx-LTSR 12/21, L TSR revision 10/11/21, TKE and revision, basal cell, PAF   PAIN:  Are you having pain? Yes: NPRS scale: 3-5/10 Pain location: R shoulder Pain description: sharp, post op Aggravating factors: moving at night Relieving factors: Not much helps currently.   PRECAUTIONS: Shoulder   WEIGHT BEARING RESTRICTIONS: No   FALLS:  Has patient fallen in last 6 months? 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, farming   PLOF: Independent   PATIENT GOALS:Recover his ability to resume his normal activities   NEXT MD VISIT:    OBJECTIVE:    DIAGNOSTIC FINDINGS:  N/A   PATIENT SURVEYS:  FOTO 25   COGNITION: Overall cognitive status: Within functional limits for tasks assessed                                  SENSATION: Not tested   POSTURE: Rounded shoulders   UPPER EXTREMITY ROM:    Passive ROM Right eval Left eval 08/30/22 RT shld supine 09/06/21 RT shld supine 09/11/22 RT shld  Active /Passive Supine 09/20/22 RT shld supine Act/Passive 10/09/22 Supine ACT/PASS  Shoulder flexion 71   132 155 130/163 150/170 165/170  Shoulder extension           Shoulder abduction     92 102 90/110 115/133 125/150  Shoulder adduction            Shoulder internal rotation 51   65    Act 78  Shoulder external rotation 58   20 32 45/52 (45 degrees abd) 45/52(45 degrees abd) 50/60 ( at 45 degrees abd)  Elbow flexion           Elbow extension           Wrist flexion           Wrist extension           Wrist ulnar deviation           Wrist radial deviation           Wrist pronation           Wrist supination           (Blank rows = not tested)   UPPER EXTREMITY MMT: Strength testing of R shoulder deferred. Active movement in R elbow, wrist, hand/fingers     PALPATION:  Swelling noted throughout RUE from shoulder to hand. Ptient reports tenderness over incision.             TODAY'S TREATMENT:  DATE:   10/09/22 UBE 2 min fwd/2 min backward L 2  Tricep ext 20# 3 sets 10 Shelve reaching 2 # flexion 10x 2 levels and then abd 10x- 2 sets Pulley shld ext and row 2 sets 10 Act/AA and PROM RT shld supine with measurements   09/25/22 UBE L 3 3 min each way Red tband scap stab 15 x each Seated row 4# 2 sets 10 Standing shld ext 4# 2 sets 10 Supine shld ex 2#- cued to stab scap with mvmt PROM   09/20/22 UBE 3 min fwd/3 min back Red tband stab ex 15 x ext and row, 10 x IR ( limited ROM)/ER 3# backward rolls with cuing 2# cane ex standing flex and chest press Shld mobs supine and seate PROM flex,abd and ER with educ to wife as they are going on vacation Belt mobs flex and abd  09/18/22 UBE L 1 3 min fwd/ 3 min back Red tband shld ext and row 2 sets 10 Door wall slide flex, abd and ER 5 x 3 sec hold issued for HEP Isometrics PROM and stretching with and without belt Shld mobs   09/11/22 Nustep L 3 push/pull 2 min PROM and stretching with and without belt mobs flex,abd and ER 5# shruggs, backward rolls and scap squeeze 10 x Red tband shld ext and row 15 x  2# wAte bar AA to increase  ROM flex and abd 10 x each 2# bar chest press ,flex ,abd and chest press     09/04/22 UBE 3 min fwd/3 mn back L 1 Finger ladder Pulleys PTA assist with some catching PROM and stretching to RT shld sitting and supine  08/30/22 PROM/stretching RT UE various positions supine and sitting, tried pulleys, tried distraction pt very quarded, spams present and increased pain with abd and ER STW to RT shld to relax muscles,teres very tender. DN by MAlbright PT to teres Reviewed and corrected tech for pendulum and added 3# wt for distraction Mat slides- instructed to do at counter top at home  08/01/22 Eduction PROM to R shoulder in flex, ER/IR, very gentle within tolerance.  Pendulum exercises.     PATIENT EDUCATION: Education details: POC, initial HEP Person educated: Patient and Spouse Education method: Customer service manager Education comprehension: verbalized understanding   HOME EXERCISE PROGRAM: [id number]   ASSESSMENT:   CLINICAL IMPRESSION: progressed ex with lt resistance and postural cuing. Guarded with PROM and  some catching if   pt can relax better motion. Assessed goals .10 weeks s/p  objectIVE IMPAIRMENTS: decreased mobility, decreased ROM, decreased strength, increased edema, increased muscle spasms, impaired flexibility, impaired sensation, impaired UE functional use, postural dysfunction, and pain.    ACTIVITY LIMITATIONS: carrying, lifting, bending, sleeping, bathing, toileting, dressing, reach over head, and hygiene/grooming   PARTICIPATION LIMITATIONS: meal prep, cleaning, laundry, interpersonal relationship, driving, shopping, community activity, occupation, and yard work   PERSONAL FACTORS: Past/current experiences are also affecting patient's functional outcome.    REHAB POTENTIAL: Good   CLINICAL DECISION MAKING: Evolving/moderate complexity   EVALUATION COMPLEXITY: Moderate     GOALS: Goals reviewed with patient? Yes   SHORT TERM GOALS:  Target date: 08/22/22   I with initial HEP Baseline: Goal status: 09/20/22 met   LONG TERM GOALS: Target date: 11/22/22   I with final HEP Baseline:  Goal status: met 08/30/22   2.  Increase R shoulder strength to at least 4/5 Baseline:  Goal status: INITIAL  10/09/22 progressing 3.  Increase R shoulder ROM  to WNL, following appropriate protocol restrictions Baseline:  Goal status: INITIAL 10/09/22 progressing   4.  Patient will be able to complete all of his required daily activities with R shoulder pain < 3/10, following all appropriate restrictions Baseline:  Goal status: INITIAL   5.  Patient will be able to lift 10# weight overhead with good control, in RUE, pain < 3/10 Baseline:  Goal status: INITIAL 6.  Increase FOTO score to at least 59            Baseline: 25            Goal status: INITIAL PLAN:   PT FREQUENCY: 2x/week   PT DURATION: 12 weeks   PLANNED INTERVENTIONS: Therapeutic exercises, Therapeutic activity, Neuromuscular re-education, Balance training, Gait training, Patient/Family education, Self Care, Joint mobilization, Dry Needling, Electrical stimulation, Cryotherapy, Moist heat, Taping, Vasopneumatic device, Ultrasound, Ionotophoresis '4mg'$ /ml Dexamethasone, and Manual therapy   PLAN FOR NEXT SESSION: progress  Armed forces operational officer PTA    [facility] at [place]. [place], [place], [address] Phone: [phone]   Fax:  [facility] at [place]. [place], [place], [address] Phone: [phone]   Fax:  [facility] at [place]. [place], [place], [address] Phone: [phone]   Fax:  [phone]  Patient Details  Name: Bruce Cobb MRN: [id number] Date of Birth: Sep 02, 1966 Referring Provider:  No ref. provider found  Encounter Date:  10/09/2022   Laqueta Carina, PTA 10/09/2022, 1:24 PM  [facility] at [place]. [place], [place], [address] Phone: [phone]   Fax:  [facility] at [place]. [place], [place], [address] Phone: [phone]   Fax:  [phone]  Patient Details  Name: Bruce Cobb MRN: [id number] Date of Birth: Jul 23, 1966 Referring Provider:  No ref. provider found  Encounter Date: 10/09/2022   Laqueta Carina, PTA 10/09/2022, 1:24 PM  [facility] at [place]. [place], [place], [address] Phone: [phone]   Fax:  [phone]

## 2022-10-11 ENCOUNTER — Ambulatory Visit: Payer: BC Managed Care – PPO | Admitting: Physical Therapy

## 2022-10-16 ENCOUNTER — Ambulatory Visit: Payer: BC Managed Care – PPO | Admitting: Physical Therapy

## 2022-10-16 DIAGNOSIS — M25511 Pain in right shoulder: Secondary | ICD-10-CM

## 2022-10-16 DIAGNOSIS — M25611 Stiffness of right shoulder, not elsewhere classified: Secondary | ICD-10-CM

## 2022-10-16 DIAGNOSIS — M6281 Muscle weakness (generalized): Secondary | ICD-10-CM | POA: Diagnosis not present

## 2022-10-16 DIAGNOSIS — Z79899 Other long term (current) drug therapy: Secondary | ICD-10-CM | POA: Diagnosis not present

## 2022-10-16 DIAGNOSIS — M25519 Pain in unspecified shoulder: Secondary | ICD-10-CM | POA: Diagnosis not present

## 2022-10-16 DIAGNOSIS — J310 Chronic rhinitis: Secondary | ICD-10-CM | POA: Diagnosis not present

## 2022-10-16 DIAGNOSIS — R030 Elevated blood-pressure reading, without diagnosis of hypertension: Secondary | ICD-10-CM | POA: Diagnosis not present

## 2022-10-16 NOTE — Therapy (Signed)
OUTPATIENT PHYSICAL THERAPY SHOULDER TREATMENT     Patient Name: Bruce Cobb MRN: [id number] DOB:1965-11-28, 57 y.o., male Today's Date: 08/01/2022              Past Medical History:  Diagnosis Date   BCC (basal cell carcinoma of skin)     Hematospermia      hx of, remotely    Paroxysmal atrial fibrillation ([place]) 09/2017    saw cards   Pars defect of lumbar spine     PONV (postoperative nausea and vomiting)     Right knee DJD     Shingles 2014    face         Past Surgical History:  Procedure Laterality Date   ANKLE FRACTURE SURGERY Left 1996    plated   KNEE ARTHROSCOPY Left 3017    plica removed   KNEE ARTHROSCOPY Right      7 total   NASAL SEPTUM SURGERY       ORIF DISTAL RADIUS FRACTURE Right      age 18   REVISION TOTAL KNEE ARTHROPLASTY Right 08/19/2017   REVISION TOTAL SHOULDER TO REVERSE TOTAL SHOULDER Left 10/11/2021    Procedure: LEFT REVISION TOTAL SHOULDER TO REVERSE TOTAL SHOULDER, RIGHT SHOULDER STERIOD INJECTION;  Surgeon: Hiram Gash, MD;  Location: [facility];  Service: Orthopedics;  Laterality: Left;   SHOULDER ARTHROSCOPY W/ ROTATOR CUFF REPAIR Right 2011    bicep tendon and labrium repair   SHOULDER ARTHROSCOPY WITH ROTATOR CUFF REPAIR Left 2012    w/ labrium repair   TOTAL KNEE ARTHROPLASTY Right 10/12/2013    Procedure: RIGHT TOTAL KNEE ARTHROPLASTY;  Surgeon: Lorn Junes, MD;  Location: [facility];  Service: Orthopedics;  Laterality: Right;   VASECTOMY            Patient Active Problem List    Diagnosis Date Noted   BCC (basal cell carcinoma of skin) 06/29/2019   Chronic leukopenia 09/24/2018   PCP NOTES >>>>>>>>>>>>>> 09/24/2018   Anxiety 09/24/2018   Insomnia 09/24/2018   History of atrial fibrillation 09/24/2018   Irregular heart rhythm 02/24/2016   Dysfunction of both eustachian tubes 01/18/2016   DJD (degenerative joint disease) 10/12/2013   Pars defect of lumbar spine        PCP: N/A   REFERRING PROVIDER: Ophelia Charter, MD   REFERRING DIAG: R TSR   THERAPY DIAG:  Muscle weakness (generalized)   Abnormal posture   Acute pain of right shoulder   Stiffness of right shoulder, not elsewhere classified   Rationale for Evaluation and Treatment: Rehabilitation   ONSET DATE: 07/30/22   SUBJECTIVE:  SUBJECTIVE STATEMENT:bicep tendon is issue. Left bothering me again    PERTINENT HISTORY: PMHx-LTSR 12/21, L TSR revision 10/11/21, TKE and revision, basal cell, PAF   PAIN:  Are you having pain? Yes: NPRS scale: 3-5/10 Pain location: R shoulder Pain description: sharp, post op Aggravating factors: moving at night Relieving factors: Not much helps currently.   PRECAUTIONS: Shoulder   WEIGHT BEARING RESTRICTIONS: No   FALLS:  Has patient fallen in last 6 months? 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, farming   PLOF: Independent   PATIENT GOALS:Recover his ability to resume his normal activities   NEXT MD VISIT:    OBJECTIVE:    DIAGNOSTIC FINDINGS:  N/A   PATIENT SURVEYS:  FOTO 25   COGNITION: Overall cognitive status: Within functional limits for tasks assessed                                  SENSATION: Not tested   POSTURE: Rounded shoulders   UPPER EXTREMITY ROM:    Passive ROM Right eval Left eval 08/30/22 RT shld supine 09/06/21 RT shld supine 09/11/22 RT shld  Active /Passive Supine 09/20/22 RT shld supine Act/Passive 10/09/22 Supine ACT/PASS  Shoulder flexion 71   132 155 130/163 150/170 165/170  Shoulder extension           Shoulder abduction     92 102 90/110 115/133 125/150  Shoulder adduction           Shoulder internal rotation 51   65    Act 78  Shoulder external  rotation 58   20 32 45/52 (45 degrees abd) 45/52(45 degrees abd) 50/60 ( at 45 degrees abd)  Elbow flexion           Elbow extension           Wrist flexion           Wrist extension           Wrist ulnar deviation           Wrist radial deviation           Wrist pronation           Wrist supination           (Blank rows = not tested)   UPPER EXTREMITY MMT: Strength testing of R shoulder deferred. Active movement in R elbow, wrist, hand/fingers     PALPATION:  Swelling noted throughout RUE from shoulder to hand. Ptient reports tenderness over incision.             TODAY'S TREATMENT:  DATE:  10/16/22 UBE L 4 3 in fwd/3 min back Finger ladder 2# flex 10 x,bad 10x 2# wall shld flex,abd and CW  and CCW 10 x each 2# standing flex,abd and ER 10 x each with PTA over pressure to increase ROM AROM standing flexion 155, abd 135 PROM and stretching RT shld    10/09/22 UBE 2 min fwd/2 min backward L 2  Tricep ext 20# 3 sets 10 Shelve reaching 2 # flexion 10x 2 levels and then abd 10x- 2 sets Pulley shld ext and row 2 sets 10 Act/AA and PROM RT shld supine with measurements   09/25/22 UBE L 3 3 min each way Red tband scap stab 15 x each Seated row 4# 2 sets 10 Standing shld ext 4# 2 sets 10 Supine shld ex 2#- cued to stab scap with mvmt PROM   09/20/22 UBE 3 min fwd/3 min back Red tband stab ex 15 x ext and row, 10 x IR ( limited ROM)/ER 3# backward rolls with cuing 2# cane ex standing flex and chest press Shld mobs supine and seate PROM flex,abd and ER with educ to wife as they are going on vacation Belt mobs flex and abd  09/18/22 UBE L 1 3 min fwd/ 3 min back Red tband shld ext and row 2 sets 10 Door wall slide flex, abd and ER 5 x 3 sec hold issued for HEP Isometrics PROM and stretching with and without belt Shld  mobs   09/11/22 Nustep L 3 push/pull 2 min PROM and stretching with and without belt mobs flex,abd and ER 5# shruggs, backward rolls and scap squeeze 10 x Red tband shld ext and row 15 x  2# wAte bar AA to increase ROM flex and abd 10 x each 2# bar chest press ,flex ,abd and chest press     09/04/22 UBE 3 min fwd/3 mn back L 1 Finger ladder Pulleys PTA assist with some catching PROM and stretching to RT shld sitting and supine  08/30/22 PROM/stretching RT UE various positions supine and sitting, tried pulleys, tried distraction pt very quarded, spams present and increased pain with abd and ER STW to RT shld to relax muscles,teres very tender. DN by MAlbright PT to teres Reviewed and corrected tech for pendulum and added 3# wt for distraction Mat slides- instructed to do at counter top at home  08/01/22 Eduction PROM to R shoulder in flex, ER/IR, very gentle within tolerance.  Pendulum exercises.     PATIENT EDUCATION: Education details: POC, initial HEP Person educated: Patient and Spouse Education method: Customer service manager Education comprehension: verbalized understanding   HOME EXERCISE PROGRAM: [id number]   ASSESSMENT:   CLINICAL IMPRESSION: progressed ex with lt resistance and postural cuing. Guarded with Pass d/t pian but actvie much improved. . Assessed goals .11weeks s/p  objectIVE IMPAIRMENTS: decreased mobility, decreased ROM, decreased strength, increased edema, increased muscle spasms, impaired flexibility, impaired sensation, impaired UE functional use, postural dysfunction, and pain.    ACTIVITY LIMITATIONS: carrying, lifting, bending, sleeping, bathing, toileting, dressing, reach over head, and hygiene/grooming   PARTICIPATION LIMITATIONS: meal prep, cleaning, laundry, interpersonal relationship, driving, shopping, community activity, occupation, and yard work   PERSONAL FACTORS: Past/current experiences are also affecting patient's functional  outcome.    REHAB POTENTIAL: Good   CLINICAL DECISION MAKING: Evolving/moderate complexity   EVALUATION COMPLEXITY: Moderate     GOALS: Goals reviewed with patient? Yes   SHORT TERM GOALS: Target date: 08/22/22   I with initial HEP Baseline:  Goal status: 09/20/22 met   LONG TERM GOALS: Target date: 11/22/22   I with final HEP Baseline:  Goal status: met 08/30/22   2.  Increase R shoulder strength to at least 4/5 Baseline:  Goal status: INITIAL  10/09/22 progressing 3.  Increase R shoulder ROM to WNL, following appropriate protocol restrictions Baseline:  Goal status: INITIAL 10/09/22 progressing  10/16/22 on oging   4.  Patient will be able to complete all of his required daily activities with R shoulder pain < 3/10, following all appropriate restrictions Baseline:  Goal status: INITIAL   5.  Patient will be able to lift 10# weight overhead with good control, in RUE, pain < 3/10 Baseline:  Goal status: INITIAL  10/16/22 on going 6.  Increase FOTO score to at least 59            Baseline: 25            Goal status: INITIAL PLAN:   PT FREQUENCY: 2x/week   PT DURATION: 12 weeks   PLANNED INTERVENTIONS: Therapeutic exercises, Therapeutic activity, Neuromuscular re-education, Balance training, Gait training, Patient/Family education, Self Care, Joint mobilization, Dry Needling, Electrical stimulation, Cryotherapy, Moist heat, Taping, Vasopneumatic device, Ultrasound, Ionotophoresis '4mg'$ /ml Dexamethasone, and Manual therapy   PLAN FOR NEXT SESSION: progress  Armed forces operational officer PTA    [facility] at [place]. [place], [place], [address] Phone: [phone]   Fax:  [facility] at [place]. [place], [place], [address] Phone: [phone]   Fax:  [facility] at [place]. [place], [place], [address] Phone: [phone]   Fax:  [phone]  Patient Details  Name: Bruce Cobb MRN: [id number] Date of Birth: [birth year] Referring Provider:  No ref. provider found  Encounter Date: 10/16/2022   Laqueta Carina, PTA 10/16/2022, 1:16 PM  [facility] at [place]. [place], [place], [address] Phone: [phone]   Fax:  [facility] at [place]. [place], [place], [address] Phone: [phone]   Fax:  [phone]  Patient Details  Name: Bruce Cobb MRN: [id number] Date of Birth: 12-19-1965 Referring Provider:  No ref. provider found  Encounter Date: 10/16/2022   Laqueta Carina, PTA 10/16/2022, 1:16 PM  [facility] at [address] [place]. [place], [place], [address] Phone: [phone]   Fax:  [facility] at [place]. [place], [place], [address] Phone: [phone]   Fax:  [phone]  Patient Details  Name: Bruce Cobb MRN: [id number] Date of Birth: September 30, 1965 Referring Provider:  No ref. provider found  Encounter Date: 10/16/2022   Laqueta Carina, PTA 10/16/2022, 1:16 PM  [facility] at [place]. [place], [place], [address] Phone: [phone]   Fax:  [phone]

## 2022-10-18 ENCOUNTER — Ambulatory Visit: Payer: BC Managed Care – PPO | Admitting: Physical Therapy

## 2022-10-25 ENCOUNTER — Ambulatory Visit: Payer: BC Managed Care – PPO | Admitting: Physical Therapy

## 2022-10-25 DIAGNOSIS — M6281 Muscle weakness (generalized): Secondary | ICD-10-CM

## 2022-10-25 DIAGNOSIS — M25611 Stiffness of right shoulder, not elsewhere classified: Secondary | ICD-10-CM | POA: Diagnosis not present

## 2022-10-25 DIAGNOSIS — M25511 Pain in right shoulder: Secondary | ICD-10-CM | POA: Diagnosis not present

## 2022-10-25 DIAGNOSIS — L111 Transient acantholytic dermatosis [Grover]: Secondary | ICD-10-CM | POA: Diagnosis not present

## 2022-10-25 DIAGNOSIS — M19011 Primary osteoarthritis, right shoulder: Secondary | ICD-10-CM | POA: Diagnosis not present

## 2022-10-25 NOTE — Therapy (Signed)
OUTPATIENT PHYSICAL THERAPY SHOULDER TREATMENT     Patient Name: Bruce Cobb MRN: [id number] DOB:1965-11-28, 56 y.o., male Today's Date: 08/01/2022              Past Medical History:  Diagnosis Date   BCC (basal cell carcinoma of skin)     Hematospermia      hx of, remotely    Paroxysmal atrial fibrillation ([place]) 09/2017    saw cards   Pars defect of lumbar spine     PONV (postoperative nausea and vomiting)     Right knee DJD     Shingles 2014    face         Past Surgical History:  Procedure Laterality Date   ANKLE FRACTURE SURGERY Left 1996    plated   KNEE ARTHROSCOPY Left 3017    plica removed   KNEE ARTHROSCOPY Right      7 total   NASAL SEPTUM SURGERY       ORIF DISTAL RADIUS FRACTURE Right      age 18   REVISION TOTAL KNEE ARTHROPLASTY Right 08/19/2017   REVISION TOTAL SHOULDER TO REVERSE TOTAL SHOULDER Left 10/11/2021    Procedure: LEFT REVISION TOTAL SHOULDER TO REVERSE TOTAL SHOULDER, RIGHT SHOULDER STERIOD INJECTION;  Surgeon: Hiram Gash, MD;  Location: [facility];  Service: Orthopedics;  Laterality: Left;   SHOULDER ARTHROSCOPY W/ ROTATOR CUFF REPAIR Right 2011    bicep tendon and labrium repair   SHOULDER ARTHROSCOPY WITH ROTATOR CUFF REPAIR Left 2012    w/ labrium repair   TOTAL KNEE ARTHROPLASTY Right 10/12/2013    Procedure: RIGHT TOTAL KNEE ARTHROPLASTY;  Surgeon: Lorn Junes, MD;  Location: [facility];  Service: Orthopedics;  Laterality: Right;   VASECTOMY            Patient Active Problem List    Diagnosis Date Noted   BCC (basal cell carcinoma of skin) 06/29/2019   Chronic leukopenia 09/24/2018   PCP NOTES >>>>>>>>>>>>>> 09/24/2018   Anxiety 09/24/2018   Insomnia 09/24/2018   History of atrial fibrillation 09/24/2018   Irregular heart rhythm 02/24/2016   Dysfunction of both eustachian tubes 01/18/2016   DJD (degenerative joint disease) 10/12/2013   Pars defect of lumbar spine        PCP: N/A   REFERRING PROVIDER: Ophelia Charter, MD   REFERRING DIAG: R TSR   THERAPY DIAG:  Muscle weakness (generalized)   Abnormal posture   Acute pain of right shoulder   Stiffness of right shoulder, not elsewhere classified   Rationale for Evaluation and Treatment: Rehabilitation   ONSET DATE: 07/30/22   SUBJECTIVE:  SUBJECTIVE STATEMENT:bicep tendon is better, no catch x 1 week. Bicep muscle aches. Doing and using it more    PERTINENT HISTORY: PMHx-LTSR 12/21, L TSR revision 10/11/21, TKE and revision, basal cell, PAF   PAIN:  Are you having pain? Yes: NPRS scale: 3-5/10 Pain location: R shoulder Pain description: sharp, post op Aggravating factors: moving at night Relieving factors: Not much helps currently.   PRECAUTIONS: Shoulder   WEIGHT BEARING RESTRICTIONS: No   FALLS:  Has patient fallen in last 6 months? 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, farming   PLOF: Independent   PATIENT GOALS:Recover his ability to resume his normal activities   NEXT MD VISIT:    OBJECTIVE:    DIAGNOSTIC FINDINGS:  N/A   PATIENT SURVEYS:  FOTO 25   COGNITION: Overall cognitive status: Within functional limits for tasks assessed                                  SENSATION: Not tested   POSTURE: Rounded shoulders   UPPER EXTREMITY ROM:    Passive ROM Right eval Left eval 08/30/22 RT shld supine 09/06/21 RT shld supine 09/11/22 RT shld  Active /Passive Supine 09/20/22 RT shld supine Act/Passive 10/09/22 Supine ACT/PASS 10/25/22 ACT/PASS standing  Shoulder flexion 71   132 155 130/163 150/170 165/170 170/180  Shoulder extension            Shoulder abduction     92 102 90/110 115/133 125/150 140/150  Shoulder  adduction            Shoulder internal rotation 51   65    Act 78 ACT 75  Shoulder external rotation 58   20 32 45/52 (45 degrees abd) 45/52(45 degrees abd) 50/60 ( at 45 degrees abd) 70/76 at 90 degrees abd  Elbow flexion            Elbow extension            Wrist flexion            Wrist extension            Wrist ulnar deviation            Wrist radial deviation            Wrist pronation            Wrist supination            (Blank rows = not tested)   UPPER EXTREMITY MMT: Strength testing of R shoulder deferred. Active movement in R elbow, wrist, hand/fingers     PALPATION:  Swelling noted throughout RUE from shoulder to hand. Ptient reports tenderness over incision.             TODAY'S TREATMENT:      10/25/22 UBE L 3 3 min fwd/3 min backward Chest press 15# 10 x 2 sets Serratus 10 x Seated row 20# 2 sets 10 Lat pull 15# 2 sets 10 Cable pulleys 10# shl shld ext and rows 2 sets 10 3# shld flex standing 10 x 3# shld abd 10x PROM and stretching  DATE:  10/16/22 UBE L 4 3 in fwd/3 min back Finger ladder 2# flex 10 x,bad 10x 2# wall shld flex,abd and CW  and CCW 10 x each 2# standing flex,abd and ER 10 x each with PTA over pressure to increase ROM AROM standing flexion 155, abd 135 PROM and stretching RT shld    10/09/22 UBE 2 min fwd/2 min backward L 2  Tricep ext 20# 3 sets 10 Shelve reaching 2 # flexion 10x 2 levels and then abd 10x- 2 sets Pulley shld ext and row 2 sets 10 Act/AA and PROM RT shld supine with measurements   09/25/22 UBE L 3 3 min each way Red tband scap stab 15 x each Seated row 4# 2 sets 10 Standing shld ext 4# 2 sets 10 Supine shld ex 2#- cued to stab scap with mvmt PROM   09/20/22 UBE 3 min fwd/3 min back Red tband stab ex 15 x ext and row, 10 x IR ( limited ROM)/ER 3# backward rolls with cuing 2# cane  ex standing flex and chest press Shld mobs supine and seate PROM flex,abd and ER with educ to wife as they are going on vacation Belt mobs flex and abd  09/18/22 UBE L 1 3 min fwd/ 3 min back Red tband shld ext and row 2 sets 10 Door wall slide flex, abd and ER 5 x 3 sec hold issued for HEP Isometrics PROM and stretching with and without belt Shld mobs   09/11/22 Nustep L 3 push/pull 2 min PROM and stretching with and without belt mobs flex,abd and ER 5# shruggs, backward rolls and scap squeeze 10 x Red tband shld ext and row 15 x  2# wAte bar AA to increase ROM flex and abd 10 x each 2# bar chest press ,flex ,abd and chest press     09/04/22 UBE 3 min fwd/3 mn back L 1 Finger ladder Pulleys PTA assist with some catching PROM and stretching to RT shld sitting and supine  08/30/22 PROM/stretching RT UE various positions supine and sitting, tried pulleys, tried distraction pt very quarded, spams present and increased pain with abd and ER STW to RT shld to relax muscles,teres very tender. DN by MAlbright PT to teres Reviewed and corrected tech for pendulum and added 3# wt for distraction Mat slides- instructed to do at counter top at home  08/01/22 Eduction PROM to R shoulder in flex, ER/IR, very gentle within tolerance.  Pendulum exercises.     PATIENT EDUCATION: Education details: POC, initial HEP Person educated: Patient and Spouse Education method: Customer service manager Education comprehension: verbalized understanding   HOME EXERCISE PROGRAM: [id number]   ASSESSMENT:   CLINICAL IMPRESSION: progressed ex with lt resistance and postural cuing. Guarded with Pass d/t pian but actvie much improved. . Assessed goals .12weeks s/p  objectIVE IMPAIRMENTS: decreased mobility, decreased ROM, decreased strength, increased edema, increased muscle spasms, impaired flexibility, impaired sensation, impaired UE functional use, postural dysfunction, and pain.    ACTIVITY  LIMITATIONS: carrying, lifting, bending, sleeping, bathing, toileting, dressing, reach over head, and hygiene/grooming   PARTICIPATION LIMITATIONS: meal prep, cleaning, laundry, interpersonal relationship, driving, shopping, community activity, occupation, and yard work   PERSONAL FACTORS: Past/current experiences are also affecting patient's functional outcome.    REHAB POTENTIAL: Good   CLINICAL DECISION MAKING: Evolving/moderate complexity   EVALUATION COMPLEXITY: Moderate     GOALS: Goals reviewed with patient? Yes   SHORT TERM GOALS: Target date: 08/22/22   I with initial HEP Baseline:  Goal status: 09/20/22 met   LONG TERM GOALS: Target date: 11/22/22   I with final HEP Baseline:  Goal status: met 08/30/22   2.  Increase R shoulder strength to at least 4/5 Baseline:  Goal status: INITIAL  10/09/22 progressing 10/25/22 on going 3.  Increase R shoulder ROM to WNL, following appropriate protocol restrictions Baseline:  Goal status: INITIAL 10/09/22 progressing  10/16/22 on going  10/25/22 progressing   4.  Patient will be able to complete all of his required daily activities with R shoulder pain < 3/10, following all appropriate restrictions Baseline:  Goal status: INITIAL  progressing 10/25/22   5.  Patient will be able to lift 10# weight overhead with good control, in RUE, pain < 3/10 Baseline:  Goal status: INITIAL  10/16/22 on going 6.  Increase FOTO score to at least 59            Baseline: 25            Goal status: INITIAL PLAN:   PT FREQUENCY: 2x/week   PT DURATION: 12 weeks   PLANNED INTERVENTIONS: Therapeutic exercises, Therapeutic activity, Neuromuscular re-education, Balance training, Gait training, Patient/Family education, Self Care, Joint mobilization, Dry Needling, Electrical stimulation, Cryotherapy, Moist heat, Taping, Vasopneumatic device, Ultrasound, Ionotophoresis 72m/ml Dexamethasone, and Manual therapy   PLAN FOR NEXT SESSION: 12 weeks s/p ANY  LIMITATIONS? MD note sent with pt  AFranki MontePTA    CLanai Cityat AL[place] [address]Phone: [phone]  Fax:  3[place]at [place] [address]Phone: [phone]  Fax:  3Welcomeat AH[place] [address]Phone: [phone]  Fax:  [phone] Patient Details  Name: Bruce DOEDENMRN: [id number] of Birth: 912/03/1967Referring Provider:  No ref. provider found  Encounter Date: 10/25/2022   PVickii Penna2/22/2024, 2:48 PM  CCambrian Parkat [place]Ver[place] [address]Phone: [phone]  Fax:  3[place]amanat AS[place] [address]Phone: [phone]  Fax:  [phone] Patient Details  Name: Bruce TOSIMRN: [id number] of Birth: [birth year]Referring Provider:  No ref. provider found  Encounter Date: 10/25/2022   PVickii Penna2/22/2024, 2:48 PM  CFriendshipat AFarm[place] [address]Phone: [phone]  Fax:  3[place]at [place]lo[place] [address]Phone: [phone]  Fax:  [phone] Patient Details  Name: Bruce RIZKMRN: [id number] of Birth: 9Aug 29, 1967Referring Provider:  No ref. provider found  Encounter Date: 10/25/2022   PVickii Penna2/22/2024, 2:48 PM  CEast Lynneat ABloom[place]ef[place] [address]109[address]Phone: [phone]  Fax:  3[place]at [place] [address]Phone: [phone]  Fax:  [phone] Patient Details  Name: Bruce DEROUSSEMRN: [id number] of Birth: [birth year]Referring Provider:  No ref. provider found  Encounter Date: 10/25/2022   PVickii Penna2/22/2024, 2:48 PM  [facility]  [facility]. [place], [place], [address] Phone: [phone]   Fax:  [phone]

## 2022-10-30 ENCOUNTER — Ambulatory Visit: Payer: BC Managed Care – PPO | Admitting: Physical Therapy

## 2022-10-30 DIAGNOSIS — M6281 Muscle weakness (generalized): Secondary | ICD-10-CM | POA: Diagnosis not present

## 2022-10-30 DIAGNOSIS — M25611 Stiffness of right shoulder, not elsewhere classified: Secondary | ICD-10-CM | POA: Diagnosis not present

## 2022-10-30 DIAGNOSIS — M25511 Pain in right shoulder: Secondary | ICD-10-CM

## 2022-10-30 NOTE — Therapy (Signed)
OUTPATIENT PHYSICAL THERAPY SHOULDER TREATMENT     Patient Name: Bruce Cobb MRN: [id number] DOB:1965-11-28, 56 y.o., male Today's Date: 08/01/2022              Past Medical History:  Diagnosis Date   BCC (basal cell carcinoma of skin)     Hematospermia      hx of, remotely    Paroxysmal atrial fibrillation ([place]) 09/2017    saw cards   Pars defect of lumbar spine     PONV (postoperative nausea and vomiting)     Right knee DJD     Shingles 2014    face         Past Surgical History:  Procedure Laterality Date   ANKLE FRACTURE SURGERY Left 1996    plated   KNEE ARTHROSCOPY Left 3017    plica removed   KNEE ARTHROSCOPY Right      7 total   NASAL SEPTUM SURGERY       ORIF DISTAL RADIUS FRACTURE Right      age 18   REVISION TOTAL KNEE ARTHROPLASTY Right 08/19/2017   REVISION TOTAL SHOULDER TO REVERSE TOTAL SHOULDER Left 10/11/2021    Procedure: LEFT REVISION TOTAL SHOULDER TO REVERSE TOTAL SHOULDER, RIGHT SHOULDER STERIOD INJECTION;  Surgeon: Hiram Gash, MD;  Location: [facility];  Service: Orthopedics;  Laterality: Left;   SHOULDER ARTHROSCOPY W/ ROTATOR CUFF REPAIR Right 2011    bicep tendon and labrium repair   SHOULDER ARTHROSCOPY WITH ROTATOR CUFF REPAIR Left 2012    w/ labrium repair   TOTAL KNEE ARTHROPLASTY Right 10/12/2013    Procedure: RIGHT TOTAL KNEE ARTHROPLASTY;  Surgeon: Lorn Junes, MD;  Location: [facility];  Service: Orthopedics;  Laterality: Right;   VASECTOMY            Patient Active Problem List    Diagnosis Date Noted   BCC (basal cell carcinoma of skin) 06/29/2019   Chronic leukopenia 09/24/2018   PCP NOTES >>>>>>>>>>>>>> 09/24/2018   Anxiety 09/24/2018   Insomnia 09/24/2018   History of atrial fibrillation 09/24/2018   Irregular heart rhythm 02/24/2016   Dysfunction of both eustachian tubes 01/18/2016   DJD (degenerative joint disease) 10/12/2013   Pars defect of lumbar spine        PCP: N/A   REFERRING PROVIDER: Ophelia Charter, MD   REFERRING DIAG: R TSR   THERAPY DIAG:  Muscle weakness (generalized)   Abnormal posture   Acute pain of right shoulder   Stiffness of right shoulder, not elsewhere classified   Rationale for Evaluation and Treatment: Rehabilitation   ONSET DATE: 07/30/22   SUBJECTIVE:  SUBJECTIVE STATEMENT:used chain saw and used joystick in tractor so very sore. Weather is not helpful  PERTINENT HISTORY: PMHx-LTSR 12/21, L TSR revision 10/11/21, TKE and revision, basal cell, PAF   PAIN:  Are you having pain? Yes: NPRS scale: 3-5/10 Pain location: R shoulder Pain description: sharp, post op Aggravating factors: moving at night Relieving factors: Not much helps currently.   PRECAUTIONS: Shoulder   WEIGHT BEARING RESTRICTIONS: No   FALLS:  Has patient fallen in last 6 months? 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, farming   PLOF: Independent   PATIENT GOALS:Recover his ability to resume his normal activities   NEXT MD VISIT:    OBJECTIVE:    DIAGNOSTIC FINDINGS:  N/A   PATIENT SURVEYS:  FOTO 25   COGNITION: Overall cognitive status: Within functional limits for tasks assessed                                  SENSATION: Not tested   POSTURE: Rounded shoulders   UPPER EXTREMITY ROM:    Passive ROM Right eval Left eval 08/30/22 RT shld supine 09/06/21 RT shld supine 09/11/22 RT shld  Active /Passive Supine 09/20/22 RT shld supine Act/Passive 10/09/22 Supine ACT/PASS 10/25/22 ACT/PASS standing  Shoulder flexion 71   132 155 130/163 150/170 165/170 170/180  Shoulder extension            Shoulder abduction     92 102 90/110 115/133 125/150 140/150  Shoulder adduction             Shoulder internal rotation 51   65    Act 78 ACT 75  Shoulder external rotation 58   20 32 45/52 (45 degrees abd) 45/52(45 degrees abd) 50/60 ( at 45 degrees abd) 70/76 at 90 degrees abd  Elbow flexion            Elbow extension            Wrist flexion            Wrist extension            Wrist ulnar deviation            Wrist radial deviation            Wrist pronation            Wrist supination            (Blank rows = not tested)   UPPER EXTREMITY MMT: Strength testing of R shoulder deferred. Active movement in R elbow, wrist, hand/fingers     PALPATION:  Swelling noted throughout RUE from shoulder to hand. Ptient reports tenderness over incision.             TODAY'S TREATMENT:       10/30/22 UBE L 3 3 min fwd and back PROM and stretching RT UE- more painful and guarded Supine 2# chest press and flex 12 x SL 2# abd and ER 12 x Seated row 2 sets 10 25# Lat Pull 25# 2 sets 10 Chest press with serratus 10 # 2 sets 10    10/25/22 UBE L 3 3 min fwd/3 min backward Chest press 15# 10 x 2 sets Serratus 10 x Seated row 20# 2 sets 10 Lat pull 15# 2 sets 10 Cable pulleys 10# shl shld ext and rows  2 sets 10 3# shld flex standing 10 x 3# shld abd 10x PROM and stretching                                                                                                                                       DATE:  10/16/22 UBE L 4 3 in fwd/3 min back Finger ladder 2# flex 10 x,bad 10x 2# wall shld flex,abd and CW  and CCW 10 x each 2# standing flex,abd and ER 10 x each with PTA over pressure to increase ROM AROM standing flexion 155, abd 135 PROM and stretching RT shld    10/09/22 UBE 2 min fwd/2 min backward L 2  Tricep ext 20# 3 sets 10 Shelve reaching 2 # flexion 10x 2 levels and then abd 10x- 2 sets Pulley shld ext and row 2 sets 10 Act/AA and PROM RT shld supine with measurements   09/25/22 UBE L 3 3 min each way Red tband scap stab 15 x each Seated row 4# 2  sets 10 Standing shld ext 4# 2 sets 10 Supine shld ex 2#- cued to stab scap with mvmt PROM   09/20/22 UBE 3 min fwd/3 min back Red tband stab ex 15 x ext and row, 10 x IR ( limited ROM)/ER 3# backward rolls with cuing 2# cane ex standing flex and chest press Shld mobs supine and seate PROM flex,abd and ER with educ to wife as they are going on vacation Belt mobs flex and abd  09/18/22 UBE L 1 3 min fwd/ 3 min back Red tband shld ext and row 2 sets 10 Door wall slide flex, abd and ER 5 x 3 sec hold issued for HEP Isometrics PROM and stretching with and without belt Shld mobs   09/11/22 Nustep L 3 push/pull 2 min PROM and stretching with and without belt mobs flex,abd and ER 5# shruggs, backward rolls and scap squeeze 10 x Red tband shld ext and row 15 x  2# wAte bar AA to increase ROM flex and abd 10 x each 2# bar chest press ,flex ,abd and chest press     09/04/22 UBE 3 min fwd/3 mn back L 1 Finger ladder Pulleys PTA assist with some catching PROM and stretching to RT shld sitting and supine  08/30/22 PROM/stretching RT UE various positions supine and sitting, tried pulleys, tried distraction pt very quarded, spams present and increased pain with abd and ER STW to RT shld to relax muscles,teres very tender. DN by MAlbright PT to teres Reviewed and corrected tech for pendulum and added 3# wt for distraction Mat slides- instructed to do at counter top at home  08/01/22 Eduction PROM to R shoulder in flex, ER/IR, very gentle within tolerance.  Pendulum exercises.     PATIENT EDUCATION: Education details: POC, initial HEP Person educated: Patient and Spouse Education method: Customer service manager Education comprehension: verbalized understanding  HOME EXERCISE PROGRAM: [id number]   ASSESSMENT:   CLINICAL IMPRESSION: pt arrived with increased pain after pulling and using chainsaw and joystick on tractor. No moving as well , catch was back and more guarding.  Cautioned against no over doing and NO pulling to crank anything objectIVE IMPAIRMENTS: decreased mobility, decreased ROM, decreased strength, increased edema, increased muscle spasms, impaired flexibility, impaired sensation, impaired UE functional use, postural dysfunction, and pain.    ACTIVITY LIMITATIONS: carrying, lifting, bending, sleeping, bathing, toileting, dressing, reach over head, and hygiene/grooming   PARTICIPATION LIMITATIONS: meal prep, cleaning, laundry, interpersonal relationship, driving, shopping, community activity, occupation, and yard work   PERSONAL FACTORS: Past/current experiences are also affecting patient's functional outcome.    REHAB POTENTIAL: Good   CLINICAL DECISION MAKING: Evolving/moderate complexity   EVALUATION COMPLEXITY: Moderate     GOALS: Goals reviewed with patient? Yes   SHORT TERM GOALS: Target date: 08/22/22   I with initial HEP Baseline: Goal status: 09/20/22 met   LONG TERM GOALS: Target date: 11/22/22   I with final HEP Baseline:  Goal status: met 08/30/22   2.  Increase R shoulder strength to at least 4/5 Baseline:  Goal status: INITIAL  10/09/22 progressing 10/25/22 on going 3.  Increase R shoulder ROM to WNL, following appropriate protocol restrictions Baseline:  Goal status: INITIAL 10/09/22 progressing  10/16/22 on going  10/25/22 progressing   4.  Patient will be able to complete all of his required daily activities with R shoulder pain < 3/10, following all appropriate restrictions Baseline:  Goal status: INITIAL  progressing 10/25/22   5.  Patient will be able to lift 10# weight overhead with good control, in RUE, pain < 3/10 Baseline:  Goal status: INITIAL  10/16/22 on going 6.  Increase FOTO score to at least 59            Baseline: 25            Goal status: INITIAL PLAN:   PT FREQUENCY: 2x/week   PT DURATION: 12 weeks   PLANNED INTERVENTIONS: Therapeutic exercises, Therapeutic activity, Neuromuscular  re-education, Balance training, Gait training, Patient/Family education, Self Care, Joint mobilization, Dry Needling, Electrical stimulation, Cryotherapy, Moist heat, Taping, Vasopneumatic device, Ultrasound, Ionotophoresis 34m/ml Dexamethasone, and Manual therapy   PLAN FOR NEXT SESSION: per pt MD stated no limitations. Assess and progress  AFranki MontePTA    CHoliday Lakesat AM[place] [address]Phone: [phone]  Fax:  3[place]at [place] [address]Phone: [phone]  Fax:  3[place]at [place] [address]Phone: [phone]  Fax:  [phone] Patient Details  Name: SNIXSON BUTTAMRN: [id number] of Birth: 913-Dec-1967Referring Provider:  No ref. provider found  Encounter Date: 10/30/2022   PVickii Penna2/27/2024, 11:51 AM  CDel Rey Oaksat [place]ch[place] [address]Phone: [phone]  Fax:  3[place] [address]Phone: [phone]  Fax:  [phone] Patient Details  Name: STAVIO MAJKAMRN: [id number] of Birth: [birth year]Referring Provider:  No ref. provider found  Encounter Date: 10/30/2022   PVickii Penna2/27/2024, 11:51 AM  [place] [place]  at [place]. [place], [place], [address] Phone: [phone]   Fax:  [facility] at [place]. [place], [place], [address] Phone: [phone]   Fax:  [phone]  Patient Details  Name: IMOGENE KAMATH MRN: [id number] Date of Birth:  Jan 04, 1966 Referring Provider:  No ref. provider found  Encounter Date: 10/30/2022   Vickii Penna 10/30/2022, 11:51 AM  [facility] at [place]. [place], [place], [address] Phone: [phone]   Fax:  [facility] at [place]. [place], [place], [address] Phone: [phone]   Fax:  [phone]  Patient Details  Name: YORDIN MAIS MRN: [id number] Date of Birth: Oct 20, 1965 Referring Provider:  No ref. provider found  Encounter Date: 10/30/2022   Vickii Penna 10/30/2022, 11:51 AM  [facility] at [place]. [place], [place], [address] Phone: [phone]   Fax:  [facility] at [place]. [place], [place], [address] Phone: [phone]   Fax:  [phone]  Patient Details  Name: JILL ADEN MRN: [id number] Date of Birth: Jul 28, 1966 Referring Provider:  No ref. provider found  Encounter Date: 10/30/2022   Vickii Penna 10/30/2022, 11:51 AM  [place] at [place]. [place], [place], [address] Phone: [phone]   Fax:  [phone]

## 2022-11-01 ENCOUNTER — Ambulatory Visit: Payer: BC Managed Care – PPO | Admitting: Physical Therapy

## 2022-11-01 DIAGNOSIS — M25511 Pain in right shoulder: Secondary | ICD-10-CM

## 2022-11-01 DIAGNOSIS — M25611 Stiffness of right shoulder, not elsewhere classified: Secondary | ICD-10-CM | POA: Diagnosis not present

## 2022-11-01 DIAGNOSIS — M6281 Muscle weakness (generalized): Secondary | ICD-10-CM | POA: Diagnosis not present

## 2022-11-01 NOTE — Therapy (Signed)
OUTPATIENT PHYSICAL THERAPY SHOULDER TREATMENT     Patient Name: Bruce Cobb MRN: [id number] DOB:1965-11-28, 56 y.o., male Today's Date: 08/01/2022              Past Medical History:  Diagnosis Date   BCC (basal cell carcinoma of skin)     Hematospermia      hx of, remotely    Paroxysmal atrial fibrillation ([place]) 09/2017    saw cards   Pars defect of lumbar spine     PONV (postoperative nausea and vomiting)     Right knee DJD     Shingles 2014    face         Past Surgical History:  Procedure Laterality Date   ANKLE FRACTURE SURGERY Left 1996    plated   KNEE ARTHROSCOPY Left 3017    plica removed   KNEE ARTHROSCOPY Right      7 total   NASAL SEPTUM SURGERY       ORIF DISTAL RADIUS FRACTURE Right      age 18   REVISION TOTAL KNEE ARTHROPLASTY Right 08/19/2017   REVISION TOTAL SHOULDER TO REVERSE TOTAL SHOULDER Left 10/11/2021    Procedure: LEFT REVISION TOTAL SHOULDER TO REVERSE TOTAL SHOULDER, RIGHT SHOULDER STERIOD INJECTION;  Surgeon: Hiram Gash, MD;  Location: [facility];  Service: Orthopedics;  Laterality: Left;   SHOULDER ARTHROSCOPY W/ ROTATOR CUFF REPAIR Right 2011    bicep tendon and labrium repair   SHOULDER ARTHROSCOPY WITH ROTATOR CUFF REPAIR Left 2012    w/ labrium repair   TOTAL KNEE ARTHROPLASTY Right 10/12/2013    Procedure: RIGHT TOTAL KNEE ARTHROPLASTY;  Surgeon: Lorn Junes, MD;  Location: [facility];  Service: Orthopedics;  Laterality: Right;   VASECTOMY            Patient Active Problem List    Diagnosis Date Noted   BCC (basal cell carcinoma of skin) 06/29/2019   Chronic leukopenia 09/24/2018   PCP NOTES >>>>>>>>>>>>>> 09/24/2018   Anxiety 09/24/2018   Insomnia 09/24/2018   History of atrial fibrillation 09/24/2018   Irregular heart rhythm 02/24/2016   Dysfunction of both eustachian tubes 01/18/2016   DJD (degenerative joint disease) 10/12/2013   Pars defect of lumbar spine        PCP: N/A   REFERRING PROVIDER: Ophelia Charter, MD   REFERRING DIAG: R TSR   THERAPY DIAG:  Muscle weakness (generalized)   Abnormal posture   Acute pain of right shoulder   Stiffness of right shoulder, not elsewhere classified   Rationale for Evaluation and Treatment: Rehabilitation   ONSET DATE: 07/30/22   SUBJECTIVE:  SUBJECTIVE STATEMENT:bicep and bicep tendon still very sore, doing a lot with it  PERTINENT HISTORY: PMHx-LTSR 12/21, L TSR revision 10/11/21, TKE and revision, basal cell, PAF   PAIN:  Are you having pain? Yes: NPRS scale: 3-5/10 Pain location: R shoulder Pain description: sharp, post op Aggravating factors: moving at night Relieving factors: Not much helps currently.   PRECAUTIONS: Shoulder   WEIGHT BEARING RESTRICTIONS: No   FALLS:  Has patient fallen in last 6 months? 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, farming   PLOF: Independent   PATIENT GOALS:Recover his ability to resume his normal activities   NEXT MD VISIT:    OBJECTIVE:    DIAGNOSTIC FINDINGS:  N/A   PATIENT SURVEYS:  FOTO 25   COGNITION: Overall cognitive status: Within functional limits for tasks assessed                                  SENSATION: Not tested   POSTURE: Rounded shoulders   UPPER EXTREMITY ROM:    Passive ROM Right eval Left eval 08/30/22 RT shld supine 09/06/21 RT shld supine 09/11/22 RT shld  Active /Passive Supine 09/20/22 RT shld supine Act/Passive 10/09/22 Supine ACT/PASS 10/25/22 ACT/PASS standing  Shoulder flexion 71   132 155 130/163 150/170 165/170 170/180  Shoulder extension            Shoulder abduction     92 102 90/110 115/133 125/150 140/150  Shoulder adduction            Shoulder  internal rotation 51   65    Act 78 ACT 75  Shoulder external rotation 58   20 32 45/52 (45 degrees abd) 45/52(45 degrees abd) 50/60 ( at 45 degrees abd) 70/76 at 90 degrees abd  Elbow flexion            Elbow extension            Wrist flexion            Wrist extension            Wrist ulnar deviation            Wrist radial deviation            Wrist pronation            Wrist supination            (Blank rows = not tested)   UPPER EXTREMITY MMT: Strength testing of R shoulder deferred. Active movement in R elbow, wrist, hand/fingers     PALPATION:  Swelling noted throughout RUE from shoulder to hand. Ptient reports tenderness over incision.             TODAY'S TREATMENT:      11/01/22 UBE L 3 3 fwd/3 back Lat pull and row 20# 2 sets 10 Chest press 15# 2 sets 10 with serratus Pulleys ext ,row,IR ,ER Rhy stab red tband DN to RT bicep PROM Rt shld   10/30/22 UBE L 3 3 min fwd and back PROM and stretching RT UE- more painful and guarded Supine 2# chest press and flex 12 x SL 2# abd and ER 12 x Seated row 2 sets 10 25# Lat Pull 25# 2 sets 10 Chest press with serratus 10 # 2 sets 10    10/25/22 UBE L  3 3 min fwd/3 min backward Chest press 15# 10 x 2 sets Serratus 10 x Seated row 20# 2 sets 10 Lat pull 15# 2 sets 10 Cable pulleys 10# shl shld ext and rows 2 sets 10 3# shld flex standing 10 x 3# shld abd 10x PROM and stretching                                                                                                                                       DATE:  10/16/22 UBE L 4 3 in fwd/3 min back Finger ladder 2# flex 10 x,bad 10x 2# wall shld flex,abd and CW  and CCW 10 x each 2# standing flex,abd and ER 10 x each with PTA over pressure to increase ROM AROM standing flexion 155, abd 135 PROM and stretching RT shld    10/09/22 UBE 2 min fwd/2 min backward L 2  Tricep ext 20# 3 sets 10 Shelve reaching 2 # flexion 10x 2 levels and then abd 10x- 2  sets Pulley shld ext and row 2 sets 10 Act/AA and PROM RT shld supine with measurements   09/25/22 UBE L 3 3 min each way Red tband scap stab 15 x each Seated row 4# 2 sets 10 Standing shld ext 4# 2 sets 10 Supine shld ex 2#- cued to stab scap with mvmt PROM   09/20/22 UBE 3 min fwd/3 min back Red tband stab ex 15 x ext and row, 10 x IR ( limited ROM)/ER 3# backward rolls with cuing 2# cane ex standing flex and chest press Shld mobs supine and seate PROM flex,abd and ER with educ to wife as they are going on vacation Belt mobs flex and abd  09/18/22 UBE L 1 3 min fwd/ 3 min back Red tband shld ext and row 2 sets 10 Door wall slide flex, abd and ER 5 x 3 sec hold issued for HEP [employer] PROM and stretching with and without belt Shld mobs   09/11/22 Nustep L 3 push/pull 2 min PROM and stretching with and without belt mobs flex,abd and ER 5# shruggs, backward rolls and scap squeeze 10 x Red tband shld ext and row 15 x  2# wAte bar AA to increase ROM flex and abd 10 x each 2# bar chest press ,flex ,abd and chest press     09/04/22 UBE 3 min fwd/3 mn back L 1 Finger ladder Pulleys PTA assist with some catching PROM and stretching to RT shld sitting and supine  08/30/22 PROM/stretching RT UE various positions supine and sitting, tried pulleys, tried distraction pt very quarded, spams present and increased pain with abd and ER STW to RT shld to relax muscles,teres very tender. DN by MAlbright PT to teres Reviewed and corrected tech for pendulum and added 3# wt for distraction Mat slides- instructed to do at counter top at home  08/01/22 Eduction PROM to R shoulder in  flex, ER/IR, very gentle within tolerance.  Pendulum exercises.     PATIENT EDUCATION: Education details: POC, initial HEP Person educated: Patient and Spouse Education method: Customer service manager Education comprehension: verbalized understanding   HOME EXERCISE PROGRAM: [id number]    ASSESSMENT:   CLINICAL IMPRESSION: bicep pain so did DN. Progress ex and PROM   objectIVE IMPAIRMENTS: decreased mobility, decreased ROM, decreased strength, increased edema, increased muscle spasms, impaired flexibility, impaired sensation, impaired UE functional use, postural dysfunction, and pain.    ACTIVITY LIMITATIONS: carrying, lifting, bending, sleeping, bathing, toileting, dressing, reach over head, and hygiene/grooming   PARTICIPATION LIMITATIONS: meal prep, cleaning, laundry, interpersonal relationship, driving, shopping, community activity, occupation, and yard work   PERSONAL FACTORS: Past/current experiences are also affecting patient's functional outcome.    REHAB POTENTIAL: Good   CLINICAL DECISION MAKING: Evolving/moderate complexity   EVALUATION COMPLEXITY: Moderate     GOALS: Goals reviewed with patient? Yes   SHORT TERM GOALS: Target date: 08/22/22   I with initial HEP Baseline: Goal status: 09/20/22 met   LONG TERM GOALS: Target date: 11/22/22   I with final HEP Baseline:  Goal status: met 08/30/22   2.  Increase R shoulder strength to at least 4/5 Baseline:  Goal status: INITIAL  10/09/22 progressing 10/25/22 on going 3.  Increase R shoulder ROM to WNL, following appropriate protocol restrictions Baseline:  Goal status: INITIAL 10/09/22 progressing  10/16/22 on going  10/25/22 progressing 11/01/22 progressing   4.  Patient will be able to complete all of his required daily activities with R shoulder pain < 3/10, following all appropriate restrictions Baseline:  Goal status: INITIAL  progressing 10/25/22   5.  Patient will be able to lift 10# weight overhead with good control, in RUE, pain < 3/10 Baseline:  Goal status: INITIAL  10/16/22 on going 6.  Increase FOTO score to at least 59            Baseline: 25            Goal status: INITIAL PLAN:   PT FREQUENCY: 2x/week   PT DURATION: 12 weeks   PLANNED INTERVENTIONS: Therapeutic exercises,  Therapeutic activity, Neuromuscular re-education, Balance training, Gait training, Patient/Family education, Self Care, Joint mobilization, Dry Needling, Electrical stimulation, Cryotherapy, Moist heat, Taping, Vasopneumatic device, Ultrasound, Ionotophoresis 72m/ml Dexamethasone, and Manual therapy   PLAN FOR NEXT SESSION: assess and progress  AArmed forces operational officerPTA    [place] [address]Phone: [phone]  Fax:  [address] [address]Phone: [phone]  Fax:  [address] [address]Phone: [phone]  Fax:  [phone] Patient Details  Name: Bruce SERAPHINMRN: [id number] of Birth: 919-Apr-1967Referring Provider:  No ref. provider found  Encounter Date: 11/01/2022   PVickii Penna2/29/2024, 2:52 PM  [place] [address]Phone: [phone]  Fax:  [address] [place] [address]Phone: [phone]  Fax:  [phone] Patient Details  Name: Bruce KIMBERMRN: [id number] of Birth: [birth year]Referring Provider:  No ref. provider found  Encounter Date: 11/01/2022   [facility] P[facility] 11/01/2022, 2:52 PM  [facility] at [place]. [place], [place], [address] Phone: [phone]   Fax:  [place] at [place]. [place], [place], [address] Phone: [phone]   Fax:  [phone]  Patient Details  Name: Bruce Cobb MRN: [id number] Date of Birth:  [birth year] Referring Provider:  No ref. provider found  Encounter Date: 11/01/2022   Vickii Penna 11/01/2022, 2:52 PM  [facility] at [place]. [place], [place], [address] Phone: [phone]   Fax:  [facility] at [place]. [place], [place], [address] Phone: [phone]   Fax:  [phone]  Patient Details  Name: Bruce Cobb MRN: [id number] Date of Birth: [birth year] Referring Provider:  No ref. provider found  Encounter Date: 11/01/2022   Vickii Penna 11/01/2022, 2:52 PM  [place] at [place]. [place], [place], [address] Phone: [phone]   Fax:  [place] at [place]. [place], [place], [address] Phone: [phone]   Fax:  [phone]  Patient Details  Name: Bruce Cobb MRN: [id number] Date of Birth: December 20, 1965 Referring Provider:  No ref. provider found  Encounter Date: 11/01/2022   Vickii Penna 11/01/2022, 2:52 PM  [place] at [place]. [place], [place], [address] Phone: [phone]   Fax:  [place] at [place]. [place], [place], [address] Phone: [phone]   Fax:  [phone]  Patient Details  Name: Bruce Cobb MRN: [id number] Date of Birth: Jul 02, 1966 Referring Provider:  No ref. provider found  Encounter Date: 11/01/2022   Vickii Penna 11/01/2022, 2:52 PM  [place] at [place]. [place], [place], [address] Phone: [phone]   Fax:  [phone]

## 2022-11-06 ENCOUNTER — Ambulatory Visit: Payer: BC Managed Care – PPO | Attending: Orthopaedic Surgery | Admitting: Physical Therapy

## 2022-11-06 DIAGNOSIS — M25511 Pain in right shoulder: Secondary | ICD-10-CM | POA: Diagnosis not present

## 2022-11-06 DIAGNOSIS — M6281 Muscle weakness (generalized): Secondary | ICD-10-CM

## 2022-11-06 DIAGNOSIS — M25611 Stiffness of right shoulder, not elsewhere classified: Secondary | ICD-10-CM

## 2022-11-06 DIAGNOSIS — R293 Abnormal posture: Secondary | ICD-10-CM | POA: Diagnosis not present

## 2022-11-06 NOTE — Therapy (Signed)
OUTPATIENT PHYSICAL THERAPY SHOULDER TREATMENT     Patient Name: Bruce Cobb MRN: [id number] DOB:1965-11-28, 57 y.o., male Today's Date: 08/01/2022              Past Medical History:  Diagnosis Date   BCC (basal cell carcinoma of skin)     Hematospermia      hx of, remotely    Paroxysmal atrial fibrillation ([place]) 09/2017    saw cards   Pars defect of lumbar spine     PONV (postoperative nausea and vomiting)     Right knee DJD     Shingles 2014    face         Past Surgical History:  Procedure Laterality Date   ANKLE FRACTURE SURGERY Left 1996    plated   KNEE ARTHROSCOPY Left 3017    plica removed   KNEE ARTHROSCOPY Right      7 total   NASAL SEPTUM SURGERY       ORIF DISTAL RADIUS FRACTURE Right      age 18   REVISION TOTAL KNEE ARTHROPLASTY Right 08/19/2017   REVISION TOTAL SHOULDER TO REVERSE TOTAL SHOULDER Left 10/11/2021    Procedure: LEFT REVISION TOTAL SHOULDER TO REVERSE TOTAL SHOULDER, RIGHT SHOULDER STERIOD INJECTION;  Surgeon: Hiram Gash, MD;  Location: [facility];  Service: Orthopedics;  Laterality: Left;   SHOULDER ARTHROSCOPY W/ ROTATOR CUFF REPAIR Right 2011    bicep tendon and labrium repair   SHOULDER ARTHROSCOPY WITH ROTATOR CUFF REPAIR Left 2012    w/ labrium repair   TOTAL KNEE ARTHROPLASTY Right 10/12/2013    Procedure: RIGHT TOTAL KNEE ARTHROPLASTY;  Surgeon: Lorn Junes, MD;  Location: [facility];  Service: Orthopedics;  Laterality: Right;   VASECTOMY            Patient Active Problem List    Diagnosis Date Noted   BCC (basal cell carcinoma of skin) 06/29/2019   Chronic leukopenia 09/24/2018   PCP NOTES >>>>>>>>>>>>>> 09/24/2018   Anxiety 09/24/2018   Insomnia 09/24/2018   History of atrial fibrillation 09/24/2018   Irregular heart rhythm 02/24/2016   Dysfunction of both eustachian tubes 01/18/2016   DJD (degenerative joint disease) 10/12/2013   Pars defect of lumbar spine        PCP: N/A   REFERRING PROVIDER: Ophelia Charter, MD   REFERRING DIAG: R TSR   THERAPY DIAG:  Muscle weakness (generalized)   Abnormal posture   Acute pain of right shoulder   Stiffness of right shoulder, not elsewhere classified   Rationale for Evaluation and Treatment: Rehabilitation   ONSET DATE: 07/30/22   SUBJECTIVE:  SUBJECTIVE STATEMENT:bicep tendon is still catching and acting up. Getting very frustrated  PERTINENT HISTORY: PMHx-LTSR 12/21, L TSR revision 10/11/21, TKE and revision, basal cell, PAF   PAIN:  Are you having pain? Yes: NPRS scale: 3-5/10 Pain location: R shoulder Pain description: sharp, post op Aggravating factors: moving at night Relieving factors: Not much helps currently.   PRECAUTIONS: Shoulder   WEIGHT BEARING RESTRICTIONS: No   FALLS:  Has patient fallen in last 6 months? 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, farming   PLOF: Independent   PATIENT GOALS:Recover his ability to resume his normal activities   NEXT MD VISIT:    OBJECTIVE:    DIAGNOSTIC FINDINGS:  N/A   PATIENT SURVEYS:  FOTO 25   COGNITION: Overall cognitive status: Within functional limits for tasks assessed                                  SENSATION: Not tested   POSTURE: Rounded shoulders   UPPER EXTREMITY ROM:    Passive ROM Right eval Left eval 08/30/22 RT shld supine 09/06/21 RT shld supine 09/11/22 RT shld  Active /Passive Supine 09/20/22 RT shld supine Act/Passive 10/09/22 Supine ACT/PASS 10/25/22 ACT/PASS standing  Shoulder flexion 71   132 155 130/163 150/170 165/170 170/180  Shoulder extension            Shoulder abduction     92 102 90/110 115/133 125/150 140/150  Shoulder adduction             Shoulder internal rotation 51   65    Act 78 ACT 75  Shoulder external rotation 58   20 32 45/52 (45 degrees abd) 45/52(45 degrees abd) 50/60 ( at 45 degrees abd) 70/76 at 90 degrees abd  Elbow flexion            Elbow extension            Wrist flexion            Wrist extension            Wrist ulnar deviation            Wrist radial deviation            Wrist pronation            Wrist supination            (Blank rows = not tested)   UPPER EXTREMITY MMT: Strength testing of R shoulder deferred. Active movement in R elbow, wrist, hand/fingers     PALPATION:  Swelling noted throughout RUE from shoulder to hand. Ptient reports tenderness over incision.             TODAY'S TREATMENT:      11/06/22 UBE 3 min fwd and back Func reaching standing various angles and directions 3# 3 sets 10 Attempted standing and SL abd but pain and catch PROM and STW also with painful catch esp with esp Prone ITW variations with 3# PROM prone   11/01/22 UBE L 3 3 fwd/3 back Lat pull and row 20# 2 sets 10 Chest press 15# 2 sets 10 with serratus Pulleys ext ,row,IR ,ER Rhy stab red tband DN to RT bicep PROM Rt shld   10/30/22 UBE L 3 3 min fwd and back PROM and stretching RT UE- more  painful and guarded Supine 2# chest press and flex 12 x SL 2# abd and ER 12 x Seated row 2 sets 10 25# Lat Pull 25# 2 sets 10 Chest press with serratus 10 # 2 sets 10    10/25/22 UBE L 3 3 min fwd/3 min backward Chest press 15# 10 x 2 sets Serratus 10 x Seated row 20# 2 sets 10 Lat pull 15# 2 sets 10 Cable pulleys 10# shl shld ext and rows 2 sets 10 3# shld flex standing 10 x 3# shld abd 10x PROM and stretching                                                                                                                                       DATE:  10/16/22 UBE L 4 3 in fwd/3 min back Finger ladder 2# flex 10 x,bad 10x 2# wall shld flex,abd and CW  and CCW 10 x each 2# standing flex,abd and ER 10  x each with PTA over pressure to increase ROM AROM standing flexion 155, abd 135 PROM and stretching RT shld    10/09/22 UBE 2 min fwd/2 min backward L 2  Tricep ext 20# 3 sets 10 Shelve reaching 2 # flexion 10x 2 levels and then abd 10x- 2 sets Pulley shld ext and row 2 sets 10 Act/AA and PROM RT shld supine with measurements   09/25/22 UBE L 3 3 min each way Red tband scap stab 15 x each Seated row 4# 2 sets 10 Standing shld ext 4# 2 sets 10 Supine shld ex 2#- cued to stab scap with mvmt PROM   09/20/22 UBE 3 min fwd/3 min back Red tband stab ex 15 x ext and row, 10 x IR ( limited ROM)/ER 3# backward rolls with cuing 2# cane ex standing flex and chest press Shld mobs supine and seate PROM flex,abd and ER with educ to wife as they are going on vacation Belt mobs flex and abd  09/18/22 UBE L 1 3 min fwd/ 3 min back Red tband shld ext and row 2 sets 10 Door wall slide flex, abd and ER 5 x 3 sec hold issued for HEP Isometrics PROM and stretching with and without belt Shld mobs   09/11/22 Nustep L 3 push/pull 2 min PROM and stretching with and without belt mobs flex,abd and ER 5# shruggs, backward rolls and scap squeeze 10 x Red tband shld ext and row 15 x  2# wAte bar AA to increase ROM flex and abd 10 x each 2# bar chest press ,flex ,abd and chest press     09/04/22 UBE 3 min fwd/3 mn back L 1 Finger ladder Pulleys PTA assist with some catching PROM and stretching to RT shld sitting and supine  08/30/22 PROM/stretching RT UE various positions supine and sitting, tried pulleys, tried distraction pt very quarded, spams present and increased pain with abd and ER  STW to RT shld to relax muscles,teres very tender. DN by MAlbright PT to teres Reviewed and corrected tech for pendulum and added 3# wt for distraction Mat slides- instructed to do at counter top at home  08/01/22 Eduction PROM to R shoulder in flex, ER/IR, very gentle within tolerance.  Pendulum  exercises.     PATIENT EDUCATION: Education details: POC, initial HEP Person educated: Patient and Spouse Education method: Customer service manager Education comprehension: verbalized understanding   HOME EXERCISE PROGRAM: [id number]   ASSESSMENT:   CLINICAL IMPRESSION- issues still with catching esp with eccentric lowering, pt struggles to relax. Pt did much better in prone ,antigravity . ROM is not bad but catching with activation is an issue   objectIVE IMPAIRMENTS: decreased mobility, decreased ROM, decreased strength, increased edema, increased muscle spasms, impaired flexibility, impaired sensation, impaired UE functional use, postural dysfunction, and pain.    ACTIVITY LIMITATIONS: carrying, lifting, bending, sleeping, bathing, toileting, dressing, reach over head, and hygiene/grooming   PARTICIPATION LIMITATIONS: meal prep, cleaning, laundry, interpersonal relationship, driving, shopping, community activity, occupation, and yard work   PERSONAL FACTORS: Past/current experiences are also affecting patient's functional outcome.    REHAB POTENTIAL: Good   CLINICAL DECISION MAKING: Evolving/moderate complexity   EVALUATION COMPLEXITY: Moderate     GOALS: Goals reviewed with patient? Yes   SHORT TERM GOALS: Target date: 08/22/22   I with initial HEP Baseline: Goal status: 09/20/22 met   LONG TERM GOALS: Target date: 11/22/22   I with final HEP Baseline:  Goal status: met 08/30/22   2.  Increase R shoulder strength to at least 4/5 Baseline:  Goal status: INITIAL  10/09/22 progressing 10/25/22 on going 3.  Increase R shoulder ROM to WNL, following appropriate protocol restrictions Baseline:  Goal status: INITIAL 10/09/22 progressing  10/16/22 on going  10/25/22 progressing 11/01/22 progressing   4.  Patient will be able to complete all of his required daily activities with R shoulder pain < 3/10, following all appropriate restrictions Baseline:  Goal status:  INITIAL  progressing 10/25/22   5.  Patient will be able to lift 10# weight overhead with good control, in RUE, pain < 3/10 Baseline:  Goal status: INITIAL  10/16/22 on going 6.  Increase FOTO score to at least 59            Baseline: 25            Goal status: INITIAL PLAN:   PT FREQUENCY: 2x/week   PT DURATION: 12 weeks   PLANNED INTERVENTIONS: Therapeutic exercises, Therapeutic activity, Neuromuscular re-education, Balance training, Gait training, Patient/Family education, Self Care, Joint mobilization, Dry Needling, Electrical stimulation, Cryotherapy, Moist heat, Taping, Vasopneumatic device, Ultrasound, Ionotophoresis '4mg'$ /ml Dexamethasone, and Manual therapy   PLAN FOR NEXT SESSION: assess and progress  Armed forces operational officer PTA    [facility] [place]. [place], [place], [address] Phone: [phone]   Fax:  [facility] [place]. [place], [place], [address] Phone: [phone]   Fax:  [facility] [place]. [place], [place], [address] Phone: [phone]   Fax:  [phone]  Patient Details  Name: UDAY ARTRIP MRN: [id number] Date of Birth: 1966/01/31 Referring Provider:  No ref. provider found  Encounter Date: 11/06/2022   Laqueta Carina, PTA 11/06/2022, 2:01 PM  [facility] [place]  [place]. [place], [place], [address] Phone: [phone]   Fax:  [facility] at [place]. [place], [place], [address] Phone: [phone]   Fax:  [phone]  Patient Details  Name: MAXIMILLIAN SHINDLE MRN: [id number] Date of Birth: [birth year] Referring Provider:  No ref. provider found  Encounter Date: 11/06/2022   Laqueta Carina, PTA 11/06/2022, 2:01 PM  [facility] at [place]. [place], [place], [address] Phone: [phone]   Fax:  [facility] at [place]. [place], [place], [address] Phone: [phone]   Fax:  [phone]  Patient Details  Name: SOLIMAN PULEIO MRN: [id number] Date of Birth: 07-12-1966 Referring Provider:  No ref. provider found  Encounter Date: 11/06/2022   Laqueta Carina, PTA 11/06/2022, 2:01 PM  [facility] at [place]. [place], [place], [address] Phone: [phone]   Fax:  [facility] [place]. [place], [place], [address] Phone: [phone]   Fax:  [phone]  Patient Details  Name: MIKELLE MINEAR MRN: [id number] Date of Birth: 1965/10/23 Referring Provider:  No ref. provider found  Encounter Date: 11/06/2022   Laqueta Carina, PTA 11/06/2022, 2:01 PM  [facility] at [place]. [place], [place], [address] Phone: [phone]   Fax:  [facility] at [place]. [place], [place], [address] Phone: [phone]   Fax:  [phone]  Patient Details  Name: NICKOLES PACYNA MRN: [id number] Date of Birth: Sep 16, 1965 Referring Provider:  No ref. provider found  Encounter Date: 11/06/2022   Laqueta Carina, PTA 11/06/2022, 2:01 PM  [facility] at [place]. [place], [place], [address] Phone: [phone]   Fax:  [facility] [place]. [place], [place], [address] Phone: [phone]   Fax:  [phone]  Patient Details  Name: STEAVEN ARN MRN: [id number] Date of Birth: 04-22-1966 Referring Provider:  No ref.  provider found  Encounter Date: 11/06/2022   Laqueta Carina, PTA 11/06/2022, 2:01 PM  [facility] at [place]. [place], [place], [address] Phone: [phone]   Fax:  [facility] at [place]. [place], [place], [address] Phone: [phone]   Fax:  [phone]  Patient Details  Name: FRANCOIS OLIVA MRN: [id number] Date of Birth: 10/14/1965 Referring Provider:  No ref. provider found  Encounter Date: 11/06/2022   Laqueta Carina, PTA 11/06/2022, 2:01 PM  [facility] at [place]. [place], [place], [address] Phone: [phone]   Fax:  [phone]

## 2022-11-08 ENCOUNTER — Ambulatory Visit: Payer: BC Managed Care – PPO | Admitting: Physical Therapy

## 2022-11-08 DIAGNOSIS — M6281 Muscle weakness (generalized): Secondary | ICD-10-CM

## 2022-11-08 DIAGNOSIS — R293 Abnormal posture: Secondary | ICD-10-CM | POA: Diagnosis not present

## 2022-11-08 DIAGNOSIS — M25511 Pain in right shoulder: Secondary | ICD-10-CM

## 2022-11-08 DIAGNOSIS — M25611 Stiffness of right shoulder, not elsewhere classified: Secondary | ICD-10-CM | POA: Diagnosis not present

## 2022-11-08 NOTE — Therapy (Signed)
OUTPATIENT PHYSICAL THERAPY SHOULDER TREATMENT     Patient Name: Bruce Cobb MRN: [id number] DOB:1965-11-28, 57 y.o., male Today's Date: 08/01/2022              Past Medical History:  Diagnosis Date   BCC (basal cell carcinoma of skin)     Hematospermia      hx of, remotely    Paroxysmal atrial fibrillation ([facility]) 09/2017    saw cards   Pars defect of lumbar spine     PONV (postoperative nausea and vomiting)     Right knee DJD     Shingles 2014    face         Past Surgical History:  Procedure Laterality Date   ANKLE FRACTURE SURGERY Left 1996    plated   KNEE ARTHROSCOPY Left 3017    plica removed   KNEE ARTHROSCOPY Right      7 total   NASAL SEPTUM SURGERY       ORIF DISTAL RADIUS FRACTURE Right      age 18   REVISION TOTAL KNEE ARTHROPLASTY Right 08/19/2017   REVISION TOTAL SHOULDER TO REVERSE TOTAL SHOULDER Left 10/11/2021    Procedure: LEFT REVISION TOTAL SHOULDER TO REVERSE TOTAL SHOULDER, RIGHT SHOULDER STERIOD INJECTION;  Surgeon: Hiram Gash, MD;  Location: [facility];  Service: Orthopedics;  Laterality: Left;   SHOULDER ARTHROSCOPY W/ ROTATOR CUFF REPAIR Right 2011    bicep tendon and labrium repair   SHOULDER ARTHROSCOPY WITH ROTATOR CUFF REPAIR Left 2012    w/ labrium repair   TOTAL KNEE ARTHROPLASTY Right 10/12/2013    Procedure: RIGHT TOTAL KNEE ARTHROPLASTY;  Surgeon: Lorn Junes, MD;  Location: [facility];  Service: Orthopedics;  Laterality: Right;   VASECTOMY            Patient Active Problem List    Diagnosis Date Noted   BCC (basal cell carcinoma of skin) 06/29/2019   Chronic leukopenia 09/24/2018   PCP NOTES >>>>>>>>>>>>>> 09/24/2018   Anxiety 09/24/2018   Insomnia 09/24/2018   History of atrial fibrillation 09/24/2018   Irregular heart rhythm 02/24/2016   Dysfunction of both eustachian tubes 01/18/2016   DJD (degenerative joint disease) 10/12/2013   Pars defect of lumbar spine        PCP: N/A   REFERRING PROVIDER: Ophelia Charter, MD   REFERRING DIAG: R TSR   THERAPY DIAG:  Muscle weakness (generalized)   Abnormal posture   Acute pain of right shoulder   Stiffness of right shoulder, not elsewhere classified   Rationale for Evaluation and Treatment: Rehabilitation   ONSET DATE: 07/30/22   SUBJECTIVE:  SUBJECTIVE STATEMENT:got a massage and sore after that- she said I would be. Bicep still an issue, shower really kills me  PERTINENT HISTORY: PMHx-LTSR 12/21, L TSR revision 10/11/21, TKE and revision, basal cell, PAF   PAIN:  Are you having pain? Yes: NPRS scale: 3-5/10 Pain location: R shoulder Pain description: sharp, post op Aggravating factors: moving at night Relieving factors: Not much helps currently.   PRECAUTIONS: Shoulder   WEIGHT BEARING RESTRICTIONS: No   FALLS:  Has patient fallen in last 6 months? 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, farming   PLOF: Independent   PATIENT GOALS:Recover his ability to resume his normal activities   NEXT MD VISIT:    OBJECTIVE:    DIAGNOSTIC FINDINGS:  N/A   PATIENT SURVEYS:  FOTO 25   COGNITION: Overall cognitive status: Within functional limits for tasks assessed                                  SENSATION: Not tested   POSTURE: Rounded shoulders   UPPER EXTREMITY ROM:    Passive ROM Right eval Left eval 08/30/22 RT shld supine 09/06/21 RT shld supine 09/11/22 RT shld  Active /Passive Supine 09/20/22 RT shld supine Act/Passive 10/09/22 Supine ACT/PASS 10/25/22 ACT/PASS standing 11/08/22  Act Standing  Shoulder flexion 71   132 155 130/163 150/170 165/170 170/180 170  Shoulder extension             Shoulder abduction     92 102 90/110  115/133 125/150 140/150 158  Shoulder adduction             Shoulder internal rotation 51   65    Act 78 ACT 75   Shoulder external rotation 58   20 32 45/52 (45 degrees abd) 45/52(45 degrees abd) 50/60 ( at 45 degrees abd) 70/76 at 90 degrees abd   Elbow flexion             Elbow extension             Wrist flexion             Wrist extension             Wrist ulnar deviation             Wrist radial deviation             Wrist pronation             Wrist supination             (Blank rows = not tested)   UPPER EXTREMITY MMT: Strength testing of R shoulder deferred. Active movement in R elbow, wrist, hand/fingers     PALPATION:  Swelling noted throughout RUE from shoulder to hand. Ptient reports tenderness over incision.             TODAY'S TREATMENT:      11/08/22 UBE L 3 3 min fwd/3 min back Red tband shld ext and row 2 sets 15 Red tband ER 2 sets 10,, IR 2 sets 10 3# upper cuts and hitchhiker 2 sets 10 Triceps ext 35# and Bicep 25# 2 sets 10 each 10# shruggs and rolls 15 x each OH press, shld flex,abd chest press,ER 2 sets 10 3# prone I T W PROM RT shld  11/06/22 UBE 3  min fwd and back Func reaching standing various angles and directions 3# 3 sets 10 Attempted standing and SL abd but pain and catch PROM and STW also with painful catch esp with esp Prone ITW variations with 3# PROM prone   11/01/22 UBE L 3 3 fwd/3 back Lat pull and row 20# 2 sets 10 Chest press 15# 2 sets 10 with serratus Pulleys ext ,row,IR ,ER Rhy stab red tband DN to RT bicep PROM Rt shld   10/30/22 UBE L 3 3 min fwd and back PROM and stretching RT UE- more painful and guarded Supine 2# chest press and flex 12 x SL 2# abd and ER 12 x Seated row 2 sets 10 25# Lat Pull 25# 2 sets 10 Chest press with serratus 10 # 2 sets 10    10/25/22 UBE L 3 3 min fwd/3 min backward Chest press 15# 10 x 2 sets Serratus 10 x Seated row 20# 2 sets 10 Lat pull 15# 2 sets 10 Cable pulleys 10# shl shld  ext and rows 2 sets 10 3# shld flex standing 10 x 3# shld abd 10x PROM and stretching                                                                                                                                       DATE:  10/16/22 UBE L 4 3 in fwd/3 min back Finger ladder 2# flex 10 x,bad 10x 2# wall shld flex,abd and CW  and CCW 10 x each 2# standing flex,abd and ER 10 x each with PTA over pressure to increase ROM AROM standing flexion 155, abd 135 PROM and stretching RT shld    10/09/22 UBE 2 min fwd/2 min backward L 2  Tricep ext 20# 3 sets 10 Shelve reaching 2 # flexion 10x 2 levels and then abd 10x- 2 sets Pulley shld ext and row 2 sets 10 Act/AA and PROM RT shld supine with measurements   09/25/22 UBE L 3 3 min each way Red tband scap stab 15 x each Seated row 4# 2 sets 10 Standing shld ext 4# 2 sets 10 Supine shld ex 2#- cued to stab scap with mvmt PROM   09/20/22 UBE 3 min fwd/3 min back Red tband stab ex 15 x ext and row, 10 x IR ( limited ROM)/ER 3# backward rolls with cuing 2# cane ex standing flex and chest press Shld mobs supine and seate PROM flex,abd and ER with educ to wife as they are going on vacation Belt mobs flex and abd  09/18/22 UBE L 1 3 min fwd/ 3 min back Red tband shld ext and row 2 sets 10 Door wall slide flex, abd and ER 5 x 3 sec hold issued for HEP Isometrics PROM and stretching with and without belt Shld mobs   09/11/22 Nustep L 3 push/pull 2 min PROM and stretching with and without belt mobs flex,abd  and ER 5# shruggs, backward rolls and scap squeeze 10 x Red tband shld ext and row 15 x  2# wAte bar AA to increase ROM flex and abd 10 x each 2# bar chest press ,flex ,abd and chest press     09/04/22 UBE 3 min fwd/3 mn back L 1 Finger ladder Pulleys PTA assist with some catching PROM and stretching to RT shld sitting and supine  08/30/22 PROM/stretching RT UE various positions supine and sitting, tried pulleys, tried  distraction pt very quarded, spams present and increased pain with abd and ER STW to RT shld to relax muscles,teres very tender. DN by MAlbright PT to teres Reviewed and corrected tech for pendulum and added 3# wt for distraction Mat slides- instructed to do at counter top at home  08/01/22 Eduction PROM to R shoulder in flex, ER/IR, very gentle within tolerance.  Pendulum exercises.     PATIENT EDUCATION: Education details: POC, initial HEP Person educated: Patient and Spouse Education method: Customer service manager Education comprehension: verbalized understanding   HOME EXERCISE PROGRAM: [id number]   ASSESSMENT:   CLINICAL IMPRESSION- goals and ROM assessed. Pt still with bicep issues but seems to be getting better with less catching but showering is still an issue that sets it off. Progressing strength and ROM objectIVE IMPAIRMENTS: decreased mobility, decreased ROM, decreased strength, increased edema, increased muscle spasms, impaired flexibility, impaired sensation, impaired UE functional use, postural dysfunction, and pain.    ACTIVITY LIMITATIONS: carrying, lifting, bending, sleeping, bathing, toileting, dressing, reach over head, and hygiene/grooming   PARTICIPATION LIMITATIONS: meal prep, cleaning, laundry, interpersonal relationship, driving, shopping, community activity, occupation, and yard work   PERSONAL FACTORS: Past/current experiences are also affecting patient's functional outcome.    REHAB POTENTIAL: Good   CLINICAL DECISION MAKING: Evolving/moderate complexity   EVALUATION COMPLEXITY: Moderate     GOALS: Goals reviewed with patient? Yes   SHORT TERM GOALS: Target date: 08/22/22   I with initial HEP Baseline: Goal status: 09/20/22 met   LONG TERM GOALS: Target date: 11/22/22   I with final HEP Baseline:  Goal status: met 08/30/22   2.  Increase R shoulder strength to at least 4/5 Baseline:  Goal status: INITIAL  10/09/22 progressing  10/25/22 on going  11/08/22 progressing 3.  Increase R shoulder ROM to WNL, following appropriate protocol restrictions Baseline:  Goal status: INITIAL 10/09/22 progressing  10/16/22 on going  10/25/22 progressing 11/01/22 progressing and 11/08/22   4.  Patient will be able to complete all of his required daily activities with R shoulder pain < 3/10, following all appropriate restrictions Baseline:  Goal status: INITIAL  progressing 10/25/22 and 11/08/22   5.  Patient will be able to lift 10# weight overhead with good control, in RUE, pain < 3/10 Baseline:  Goal status: INITIAL  10/16/22 on going 6.  Increase FOTO score to at least 59            Baseline: 25            Goal status: INITIAL PLAN:   PT FREQUENCY: 2x/week   PT DURATION: 12 weeks   PLANNED INTERVENTIONS: Therapeutic exercises, Therapeutic activity, Neuromuscular re-education, Balance training, Gait training, Patient/Family education, Self Care, Joint mobilization, Dry Needling, Electrical stimulation, Cryotherapy, Moist heat, Taping, Vasopneumatic device, Ultrasound, Ionotophoresis '4mg'$ /ml Dexamethasone, and Manual therapy   PLAN FOR NEXT SESSION: assess and progress  Armed forces operational officer PTA    [facility] [place].  [place], [place], [address] Phone: [phone]   Fax:  [facility] at [place]. [place], [place], [address] Phone: [phone]   Fax:  [facility] [place]. [place], [place], [address] Phone: [phone]   Fax:  [phone]  Patient Details  Name: Bruce Cobb MRN: [id number] Date of Birth: September 23, 1965 Referring Provider:  No ref. provider found  Encounter Date: 11/08/2022   Elisabella Hacker,[name], PTA 11/08/2022, 3:21 PM

## 2022-11-16 ENCOUNTER — Encounter: Payer: Self-pay | Admitting: Physical Therapy

## 2022-11-16 ENCOUNTER — Ambulatory Visit: Payer: BC Managed Care – PPO | Admitting: Physical Therapy

## 2022-11-16 DIAGNOSIS — R293 Abnormal posture: Secondary | ICD-10-CM | POA: Diagnosis not present

## 2022-11-16 DIAGNOSIS — M25511 Pain in right shoulder: Secondary | ICD-10-CM

## 2022-11-16 DIAGNOSIS — M25611 Stiffness of right shoulder, not elsewhere classified: Secondary | ICD-10-CM

## 2022-11-16 DIAGNOSIS — M6281 Muscle weakness (generalized): Secondary | ICD-10-CM | POA: Diagnosis not present

## 2022-11-16 DIAGNOSIS — J310 Chronic rhinitis: Secondary | ICD-10-CM | POA: Diagnosis not present

## 2022-11-16 DIAGNOSIS — R030 Elevated blood-pressure reading, without diagnosis of hypertension: Secondary | ICD-10-CM | POA: Diagnosis not present

## 2022-11-16 DIAGNOSIS — Z79899 Other long term (current) drug therapy: Secondary | ICD-10-CM | POA: Diagnosis not present

## 2022-11-16 DIAGNOSIS — M25519 Pain in unspecified shoulder: Secondary | ICD-10-CM | POA: Diagnosis not present

## 2022-11-16 NOTE — Therapy (Signed)
OUTPATIENT PHYSICAL THERAPY SHOULDER TREATMENT     Patient Name: Bruce Cobb MRN: [id number] DOB:1965-11-28, 57 y.o., male Today's Date: 08/01/2022              Past Medical History:  Diagnosis Date   BCC (basal cell carcinoma of skin)     Hematospermia      hx of, remotely    Paroxysmal atrial fibrillation ([place]) 09/2017    saw cards   Pars defect of lumbar spine     PONV (postoperative nausea and vomiting)     Right knee DJD     Shingles 2014    face         Past Surgical History:  Procedure Laterality Date   ANKLE FRACTURE SURGERY Left 1996    plated   KNEE ARTHROSCOPY Left 3017    plica removed   KNEE ARTHROSCOPY Right      7 total   NASAL SEPTUM SURGERY       ORIF DISTAL RADIUS FRACTURE Right      age 18   REVISION TOTAL KNEE ARTHROPLASTY Right 08/19/2017   REVISION TOTAL SHOULDER TO REVERSE TOTAL SHOULDER Left 10/11/2021    Procedure: LEFT REVISION TOTAL SHOULDER TO REVERSE TOTAL SHOULDER, RIGHT SHOULDER STERIOD INJECTION;  Surgeon: Hiram Gash, MD;  Location: [facility];  Service: Orthopedics;  Laterality: Left;   SHOULDER ARTHROSCOPY W/ ROTATOR CUFF REPAIR Right 2011    bicep tendon and labrium repair   SHOULDER ARTHROSCOPY WITH ROTATOR CUFF REPAIR Left 2012    w/ labrium repair   TOTAL KNEE ARTHROPLASTY Right 10/12/2013    Procedure: RIGHT TOTAL KNEE ARTHROPLASTY;  Surgeon: Lorn Junes, MD;  Location: [facility];  Service: Orthopedics;  Laterality: Right;   VASECTOMY            Patient Active Problem List    Diagnosis Date Noted   BCC (basal cell carcinoma of skin) 06/29/2019   Chronic leukopenia 09/24/2018   PCP NOTES >>>>>>>>>>>>>> 09/24/2018   Anxiety 09/24/2018   Insomnia 09/24/2018   History of atrial fibrillation 09/24/2018   Irregular heart rhythm 02/24/2016   Dysfunction of both eustachian tubes 01/18/2016   DJD (degenerative joint disease) 10/12/2013   Pars defect of lumbar spine        PCP: N/A   REFERRING PROVIDER: Ophelia Charter, MD   REFERRING DIAG: R TSR   THERAPY DIAG:  Muscle weakness (generalized)   Abnormal posture   Acute pain of right shoulder   Stiffness of right shoulder, not elsewhere classified   Rationale for Evaluation and Treatment: Rehabilitation   ONSET DATE: 07/30/22   SUBJECTIVE:  SUBJECTIVE STATEMENT:  Doing ok, not a lot of backlash from trip n regards to his arm.    PERTINENT HISTORY: PMHx-LTSR 12/21, L TSR revision 10/11/21, TKE and revision, basal cell, PAF   PAIN:  Are you having pain? Yes: NPRS scale: 0/10 Pain location: R shoulder Pain description: sharp, post op Aggravating factors: moving at night Relieving factors: Not much helps currently.   PRECAUTIONS: Shoulder   WEIGHT BEARING RESTRICTIONS: No   FALLS:  Has patient fallen in last 6 months? 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, farming   PLOF: Independent   PATIENT GOALS:Recover his ability to resume his normal activities   NEXT MD VISIT:    OBJECTIVE:    DIAGNOSTIC FINDINGS:  N/A   PATIENT SURVEYS:  FOTO 25   COGNITION: Overall cognitive status: Within functional limits for tasks assessed                                  SENSATION: Not tested   POSTURE: Rounded shoulders   UPPER EXTREMITY ROM:    Passive ROM Right eval Left eval 08/30/22 RT shld supine 09/06/21 RT shld supine 09/11/22 RT shld  Active /Passive Supine 09/20/22 RT shld supine Act/Passive 10/09/22 Supine ACT/PASS 10/25/22 ACT/PASS standing 11/08/22  Act Standing  Shoulder flexion 71   132 155 130/163 150/170 165/170 170/180 170  Shoulder extension             Shoulder abduction     92 102 90/110 115/133 125/150 140/150 158   Shoulder adduction             Shoulder internal rotation 51   65    Act 78 ACT 75   Shoulder external rotation 58   20 32 45/52 (45 degrees abd) 45/52(45 degrees abd) 50/60 ( at 45 degrees abd) 70/76 at 90 degrees abd   Elbow flexion             Elbow extension             Wrist flexion             Wrist extension             Wrist ulnar deviation             Wrist radial deviation             Wrist pronation             Wrist supination             (Blank rows = not tested)   UPPER EXTREMITY MMT: Strength testing of R shoulder deferred. Active movement in R elbow, wrist, hand/fingers     PALPATION:  Swelling noted throughout RUE from shoulder to hand. Ptient reports tenderness over incision.             TODAY'S TREATMENT:     11/16/22 UBE L 3 3 min fwd/3 min back Red tband shld ext and row 2 sets 15  Red tband ER 2 sets 10,, IR 2 sets 10 Standing shoulder flex and abd 2x10 Triceps ext 45# and Bicep 25# 2 sets 10 each PROM RT shld  11/08/22 UBE L 3 3 min fwd/3 min back Red tband shld ext and row 2 sets 15 Red tband ER 2 sets 10,, IR 2 sets 10  3# upper cuts and hitchhiker 2 sets 10 Triceps ext 35# and Bicep 25# 2 sets 10 each 10# shruggs and rolls 15 x each OH press, shld flex,abd chest press,ER 2 sets 10 3# prone I T W PROM RT shld  11/06/22 UBE 3 min fwd and back Func reaching standing various angles and directions 3# 3 sets 10 Attempted standing and SL abd but pain and catch PROM and STW also with painful catch esp with esp Prone ITW variations with 3# PROM prone   11/01/22 UBE L 3 3 fwd/3 back Lat pull and row 20# 2 sets 10 Chest press 15# 2 sets 10 with serratus Pulleys ext ,row,IR ,ER Rhy stab red tband DN to RT bicep PROM Rt shld   10/30/22 UBE L 3 3 min fwd and back PROM and stretching RT UE- more painful and guarded Supine 2# chest press and flex 12 x SL 2# abd and ER 12 x Seated row 2 sets 10 25# Lat Pull 25# 2 sets 10 Chest press with serratus 10  # 2 sets 10        PATIENT EDUCATION: Education details: POC, initial HEP Person educated: Patient and Spouse Education method: Explanation and Demonstration Education comprehension: verbalized understanding   HOME EXERCISE PROGRAM: [id number]   ASSESSMENT:   CLINICAL IMPRESSION-  Pt enters with reports of improvement. Continued with RUE and scapular strengthening. Tactile cues to keep elbows in with standing rows. Pt abel to increase the load with triceps extensions. Some shoulder elevating note with standing flexion and abductions. Improved PROM after anchoring scapula and stretching R shoulder flexion.  objectIVE IMPAIRMENTS: decreased mobility, decreased ROM, decreased strength, increased edema, increased muscle spasms, impaired flexibility, impaired sensation, impaired UE functional use, postural dysfunction, and pain.    ACTIVITY LIMITATIONS: carrying, lifting, bending, sleeping, bathing, toileting, dressing, reach over head, and hygiene/grooming   PARTICIPATION LIMITATIONS: meal prep, cleaning, laundry, interpersonal relationship, driving, shopping, community activity, occupation, and yard work   PERSONAL FACTORS: Past/current experiences are also affecting patient's functional outcome.    REHAB POTENTIAL: Good   CLINICAL DECISION MAKING: Evolving/moderate complexity   EVALUATION COMPLEXITY: Moderate     GOALS: Goals reviewed with patient? Yes   SHORT TERM GOALS: Target date: 08/22/22   I with initial HEP Baseline: Goal status: 09/20/22 met   LONG TERM GOALS: Target date: 11/22/22   I with final HEP Baseline:  Goal status: met 08/30/22   2.  Increase R shoulder strength to at least 4/5 Baseline:  Goal status: INITIAL  10/09/22 progressing 10/25/22 on going  11/08/22 progressing 3.  Increase R shoulder ROM to WNL, following appropriate protocol restrictions Baseline:  Goal status: INITIAL 10/09/22 progressing  10/16/22 on going  10/25/22 progressing 11/01/22  progressing and 11/08/22   4.  Patient will be able to complete all of his required daily activities with R shoulder pain < 3/10, following all appropriate restrictions Baseline:  Goal status: INITIAL  progressing 10/25/22 and 11/08/22   5.  Patient will be able to lift 10# weight overhead with good control, in RUE, pain < 3/10 Baseline:  Goal status: INITIAL  10/16/22 on going 6.  Increase FOTO score to at least 59            Baseline: 25            Goal status: INITIAL PLAN:   PT FREQUENCY: 2x/week   PT DURATION: 12 weeks   PLANNED INTERVENTIONS: Therapeutic exercises, Therapeutic activity, Neuromuscular re-education, Balance training, Gait training,  Patient/Family education, Self Care, Joint mobilization, Dry Needling, Electrical stimulation, Cryotherapy, Moist heat, Taping, Vasopneumatic device, Ultrasound, Ionotophoresis 4mg /ml Dexamethasone, and Manual therapy   PLAN FOR NEXT SESSION: assess and progress        Scot Jun, PTA 11/16/2022, 11:06 AM

## 2022-11-20 DIAGNOSIS — Z79899 Other long term (current) drug therapy: Secondary | ICD-10-CM | POA: Diagnosis not present

## 2022-11-21 ENCOUNTER — Ambulatory Visit: Payer: BC Managed Care – PPO | Admitting: Physical Therapy

## 2022-11-21 ENCOUNTER — Encounter: Payer: Self-pay | Admitting: Physical Therapy

## 2022-11-21 DIAGNOSIS — M25511 Pain in right shoulder: Secondary | ICD-10-CM

## 2022-11-21 DIAGNOSIS — R293 Abnormal posture: Secondary | ICD-10-CM | POA: Diagnosis not present

## 2022-11-21 DIAGNOSIS — M6281 Muscle weakness (generalized): Secondary | ICD-10-CM | POA: Diagnosis not present

## 2022-11-21 DIAGNOSIS — M25611 Stiffness of right shoulder, not elsewhere classified: Secondary | ICD-10-CM | POA: Diagnosis not present

## 2022-11-21 NOTE — Therapy (Signed)
OUTPATIENT PHYSICAL THERAPY SHOULDER TREATMENT     Patient Name: Bruce Cobb MRN: [id number] DOB:1965-11-28, 56 y.o., male Today's Date: 08/01/2022              Past Medical History:  Diagnosis Date   BCC (basal cell carcinoma of skin)     Hematospermia      hx of, remotely    Paroxysmal atrial fibrillation ([place]) 09/2017    saw cards   Pars defect of lumbar spine     PONV (postoperative nausea and vomiting)     Right knee DJD     Shingles 2014    face         Past Surgical History:  Procedure Laterality Date   ANKLE FRACTURE SURGERY Left 1996    plated   KNEE ARTHROSCOPY Left 3017    plica removed   KNEE ARTHROSCOPY Right      7 total   NASAL SEPTUM SURGERY       ORIF DISTAL RADIUS FRACTURE Right      age 18   REVISION TOTAL KNEE ARTHROPLASTY Right 08/19/2017   REVISION TOTAL SHOULDER TO REVERSE TOTAL SHOULDER Left 10/11/2021    Procedure: LEFT REVISION TOTAL SHOULDER TO REVERSE TOTAL SHOULDER, RIGHT SHOULDER STERIOD INJECTION;  Surgeon: Hiram Gash, MD;  Location: [facility];  Service: Orthopedics;  Laterality: Left;   SHOULDER ARTHROSCOPY W/ ROTATOR CUFF REPAIR Right 2011    bicep tendon and labrium repair   SHOULDER ARTHROSCOPY WITH ROTATOR CUFF REPAIR Left 2012    w/ labrium repair   TOTAL KNEE ARTHROPLASTY Right 10/12/2013    Procedure: RIGHT TOTAL KNEE ARTHROPLASTY;  Surgeon: Lorn Junes, MD;  Location: [facility];  Service: Orthopedics;  Laterality: Right;   VASECTOMY            Patient Active Problem List    Diagnosis Date Noted   BCC (basal cell carcinoma of skin) 06/29/2019   Chronic leukopenia 09/24/2018   PCP NOTES >>>>>>>>>>>>>> 09/24/2018   Anxiety 09/24/2018   Insomnia 09/24/2018   History of atrial fibrillation 09/24/2018   Irregular heart rhythm 02/24/2016   Dysfunction of both eustachian tubes 01/18/2016   DJD (degenerative joint disease) 10/12/2013   Pars defect of lumbar spine        PCP: N/A   REFERRING PROVIDER: Ophelia Charter, MD   REFERRING DIAG: R TSR   THERAPY DIAG:  Muscle weakness (generalized)   Abnormal posture   Acute pain of right shoulder   Stiffness of right shoulder, not elsewhere classified   Rationale for Evaluation and Treatment: Rehabilitation   ONSET DATE: 07/30/22   SUBJECTIVE:  SUBJECTIVE STATEMENT:  "Can't really say" Biceps has let go. Felt both shoulders after weedeating   PERTINENT HISTORY: PMHx-LTSR 12/21, L TSR revision 10/11/21, TKE and revision, basal cell, PAF   PAIN:  Are you having pain? Yes: NPRS scale: 3/10 Pain location: R shoulder Pain description: sharp, post op Aggravating factors: moving at night Relieving factors: Not much helps currently.   PRECAUTIONS: Shoulder   WEIGHT BEARING RESTRICTIONS: No   FALLS:  Has patient fallen in last 6 months? 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, farming   PLOF: Independent   PATIENT GOALS:Recover his ability to resume his normal activities   NEXT MD VISIT:    OBJECTIVE:    DIAGNOSTIC FINDINGS:  N/A   PATIENT SURVEYS:  FOTO 25   COGNITION: Overall cognitive status: Within functional limits for tasks assessed                                  SENSATION: Not tested   POSTURE: Rounded shoulders   UPPER EXTREMITY ROM:    Passive ROM Right eval Left eval 08/30/22 RT shld supine 09/06/21 RT shld supine 09/11/22 RT shld  Active /Passive Supine 09/20/22 RT shld supine Act/Passive 10/09/22 Supine ACT/PASS 10/25/22 ACT/PASS standing 11/08/22  Act Standing  Shoulder flexion 71   132 155 130/163 150/170 165/170 170/180 170  Shoulder extension             Shoulder abduction     92 102 90/110 115/133 125/150 140/150  158  Shoulder adduction             Shoulder internal rotation 51   65    Act 78 ACT 75   Shoulder external rotation 58   20 32 45/52 (45 degrees abd) 45/52(45 degrees abd) 50/60 ( at 45 degrees abd) 70/76 at 90 degrees abd   Elbow flexion             Elbow extension             Wrist flexion             Wrist extension             Wrist ulnar deviation             Wrist radial deviation             Wrist pronation             Wrist supination             (Blank rows = not tested)   UPPER EXTREMITY MMT: Strength testing of R shoulder deferred. Active movement in R elbow, wrist, hand/fingers     PALPATION:  Swelling noted throughout RUE from shoulder to hand. Ptient reports tenderness over incision.             TODAY'S TREATMENT:     11/21/22 UBE L 3 3 min fwd/3 min back Standing shoulder flex and abd 2x10 RUE ER/IR Red 2x10 Triceps ext 45# and Bicep 25# 2 sets 15 each PROM RT shld    11/16/22 UBE L 3 3 min fwd/3 min back Red tband shld ext and row 2 sets 15  Red tband ER 2 sets 10,, IR 2 sets 10 Standing shoulder flex and abd 2x10 Triceps ext 45# and Bicep 25# 2 sets 10 each  PROM RT shld  11/08/22 UBE L 3 3 min fwd/3 min back Red tband shld ext and row 2 sets 15 Red tband ER 2 sets 10,, IR 2 sets 10 3# upper cuts and hitchhiker 2 sets 10 Triceps ext 35# and Bicep 25# 2 sets 10 each 10# shruggs and rolls 15 x each OH press, shld flex,abd chest press,ER 2 sets 10 3# prone I T W PROM RT shld  11/06/22 UBE 3 min fwd and back Func reaching standing various angles and directions 3# 3 sets 10 Attempted standing and SL abd but pain and catch PROM and STW also with painful catch esp with esp Prone ITW variations with 3# PROM prone   11/01/22 UBE L 3 3 fwd/3 back Lat pull and row 20# 2 sets 10 Chest press 15# 2 sets 10 with serratus Pulleys ext ,row,IR ,ER Rhy stab red tband DN to RT bicep PROM Rt shld   10/30/22 UBE L 3 3 min fwd and back PROM and stretching RT  UE- more painful and guarded Supine 2# chest press and flex 12 x SL 2# abd and ER 12 x Seated row 2 sets 10 25# Lat Pull 25# 2 sets 10 Chest press with serratus 10 # 2 sets 10        PATIENT EDUCATION: Education details: POC, initial HEP Person educated: Patient and Spouse Education method: Explanation and Demonstration Education comprehension: verbalized understanding   HOME EXERCISE PROGRAM: [id number]   ASSESSMENT:   CLINICAL IMPRESSION-  Continued with RUE and scapular strengthening. Tactile cues to keep elbows in with internal and external rotation. Some shoulder elevating note with standing flexion and abductions. Some end range tightness with PROM. R shoulder passive abduction is the most limited.  objectIVE IMPAIRMENTS: decreased mobility, decreased ROM, decreased strength, increased edema, increased muscle spasms, impaired flexibility, impaired sensation, impaired UE functional use, postural dysfunction, and pain.    ACTIVITY LIMITATIONS: carrying, lifting, bending, sleeping, bathing, toileting, dressing, reach over head, and hygiene/grooming   PARTICIPATION LIMITATIONS: meal prep, cleaning, laundry, interpersonal relationship, driving, shopping, community activity, occupation, and yard work   PERSONAL FACTORS: Past/current experiences are also affecting patient's functional outcome.    REHAB POTENTIAL: Good   CLINICAL DECISION MAKING: Evolving/moderate complexity   EVALUATION COMPLEXITY: Moderate     GOALS: Goals reviewed with patient? Yes   SHORT TERM GOALS: Target date: 08/22/22   I with initial HEP Baseline: Goal status: 09/20/22 met   LONG TERM GOALS: Target date: 11/22/22   I with final HEP Baseline:  Goal status: met 08/30/22   2.  Increase R shoulder strength to at least 4/5 Baseline:  Goal status: INITIAL  10/09/22 progressing 10/25/22 on going  11/08/22 progressing 3.  Increase R shoulder ROM to WNL, following appropriate protocol  restrictions Baseline:  Goal status: INITIAL 10/09/22 progressing  10/16/22 on going  10/25/22 progressing 11/01/22 progressing and 11/08/22   4.  Patient will be able to complete all of his required daily activities with R shoulder pain < 3/10, following all appropriate restrictions Baseline:  Goal status: INITIAL  progressing 10/25/22 and 11/08/22   5.  Patient will be able to lift 10# weight overhead with good control, in RUE, pain < 3/10 Baseline:  Goal status: INITIAL  10/16/22 on going 6.  Increase FOTO score to at least 59            Baseline: 25            Goal status: INITIAL PLAN:  PT FREQUENCY: 2x/week   PT DURATION: 12 weeks   PLANNED INTERVENTIONS: Therapeutic exercises, Therapeutic activity, Neuromuscular re-education, Balance training, Gait training, Patient/Family education, Self Care, Joint mobilization, Dry Needling, Electrical stimulation, Cryotherapy, Moist heat, Taping, Vasopneumatic device, Ultrasound, Ionotophoresis 4mg /ml Dexamethasone, and Manual therapy   PLAN FOR NEXT SESSION: assess and progress        Scot Jun, PTA 11/21/2022, 11:48 AM

## 2022-11-29 ENCOUNTER — Ambulatory Visit: Payer: BC Managed Care – PPO | Admitting: Physical Therapy

## 2022-11-29 ENCOUNTER — Encounter: Payer: Self-pay | Admitting: Physical Therapy

## 2022-11-29 DIAGNOSIS — M6281 Muscle weakness (generalized): Secondary | ICD-10-CM | POA: Diagnosis not present

## 2022-11-29 DIAGNOSIS — M25611 Stiffness of right shoulder, not elsewhere classified: Secondary | ICD-10-CM

## 2022-11-29 DIAGNOSIS — M25511 Pain in right shoulder: Secondary | ICD-10-CM

## 2022-11-29 DIAGNOSIS — R293 Abnormal posture: Secondary | ICD-10-CM | POA: Diagnosis not present

## 2022-11-29 NOTE — Therapy (Signed)
PT End of Session - 11/29/22 1151     Visit Number 17    Date for PT Re-Evaluation 01/29/23    PT Start Time 1150    PT Stop Time 1230    PT Time Calculation (min) 40 min    Activity Tolerance Patient tolerated treatment well    Behavior During Therapy [facility] for tasks assessed/performed                         OUTPATIENT PHYSICAL THERAPY SHOULDER TREATMENT     Patient Name: Bruce Cobb MRN: [id number] DOB:May 29, 1966, 57 y.o., male Today's Date: 08/01/2022              Past Medical History:  Diagnosis Date   BCC (basal cell carcinoma of skin)     Hematospermia      hx of, remotely    Paroxysmal atrial fibrillation ([place]) 09/2017    saw cards   Pars defect of lumbar spine     PONV (postoperative nausea and vomiting)     Right knee DJD     Shingles 2014    face         Past Surgical History:  Procedure Laterality Date   ANKLE FRACTURE SURGERY Left 1996    plated   KNEE ARTHROSCOPY Left 0000000    plica removed   KNEE ARTHROSCOPY Right      7 total   NASAL SEPTUM SURGERY       ORIF DISTAL RADIUS FRACTURE Right      age 53   REVISION TOTAL KNEE ARTHROPLASTY Right 08/19/2017   REVISION TOTAL SHOULDER TO REVERSE TOTAL SHOULDER Left 10/11/2021    Procedure: LEFT REVISION TOTAL SHOULDER TO REVERSE TOTAL SHOULDER, RIGHT SHOULDER STERIOD INJECTION;  Surgeon: Hiram Gash, MD;  Location: [facility];  Service: Orthopedics;  Laterality: Left;   SHOULDER ARTHROSCOPY W/ ROTATOR CUFF REPAIR Right 2011    bicep tendon and labrium repair   SHOULDER ARTHROSCOPY WITH ROTATOR CUFF REPAIR Left 2012    w/ labrium repair   TOTAL KNEE ARTHROPLASTY Right 10/12/2013    Procedure: RIGHT TOTAL KNEE ARTHROPLASTY;  Surgeon: Lorn Junes, MD;  Location: [place];  Service: Orthopedics;  Laterality: Right;   VASECTOMY            Patient Active Problem List    Diagnosis Date Noted   BCC (basal cell carcinoma of skin) 06/29/2019   Chronic leukopenia 09/24/2018   PCP NOTES >>>>>>>>>>>>>>  09/24/2018   Anxiety 09/24/2018   Insomnia 09/24/2018   History of atrial fibrillation 09/24/2018   Irregular heart rhythm 02/24/2016   Dysfunction of both eustachian tubes 01/18/2016   DJD (degenerative joint disease) 10/12/2013   Pars defect of lumbar spine        PCP: N/A   REFERRING PROVIDER: Ophelia Charter, MD   REFERRING DIAG: R TSR   THERAPY DIAG:  Muscle weakness (generalized)   Abnormal posture   Acute pain of right shoulder   Stiffness of right shoulder, not elsewhere classified   Rationale for Evaluation and Treatment: Rehabilitation   ONSET DATE: 07/30/22   SUBJECTIVE:  SUBJECTIVE STATEMENT:  "Pretty good"   PERTINENT HISTORY: PMHx-LTSR 12/21, L TSR revision 10/11/21, TKE and revision, basal cell, PAF   PAIN:  Are you having pain? Yes: NPRS scale: 1/10 Pain location: R shoulder Pain description: sharp, post op Aggravating factors: moving at night Relieving factors: Not much helps currently.   PRECAUTIONS: Shoulder   WEIGHT BEARING RESTRICTIONS: No   FALLS:  Has patient fallen in last 6 months? 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, farming   PLOF: Independent   PATIENT GOALS:Recover his ability to resume his normal activities   NEXT MD VISIT:    OBJECTIVE:    DIAGNOSTIC FINDINGS:  N/A   PATIENT SURVEYS:  FOTO 25   COGNITION: Overall cognitive status: Within functional limits for tasks assessed                                  SENSATION: Not tested   POSTURE: Rounded shoulders   UPPER EXTREMITY ROM:    Passive ROM Right eval Left eval 08/30/22 RT shld supine 09/06/21 RT shld supine 09/11/22 RT shld  Active /Passive Supine 09/20/22 RT shld  supine Act/Passive 10/09/22 Supine ACT/PASS 10/25/22 ACT/PASS standing 11/08/22  Act Standing 11/29/22 Act Standing  Shoulder flexion 71   132 155 130/163 150/170 165/170 170/180 170 175  Shoulder extension              Shoulder abduction     92 102 90/110 115/133 125/150 140/150 158 165  Shoulder adduction              Shoulder internal rotation 51   65    Act 78 ACT 75    Shoulder external rotation 58   20 32 45/52 (45 degrees abd) 45/52(45 degrees abd) 50/60 ( at 45 degrees abd) 70/76 at 90 degrees abd    Elbow flexion              Elbow extension              Wrist flexion              Wrist extension              Wrist ulnar deviation              Wrist radial deviation              Wrist pronation              Wrist supination              (Blank rows = not tested)   UPPER EXTREMITY MMT: Strength testing of R shoulder deferred. Active movement in R elbow, wrist, hand/fingers     PALPATION:  Swelling noted throughout RUE from shoulder to hand. Ptient reports tenderness over incision.             TODAY'S TREATMENT:     11/29/27 UBE L3 x 3 min each Ladder flex & abductions stretch x 7 each  Seated Rows 35lb & Lats 45lb 2x10 Shoulder Ext 10lb 2x10 ER red 2x10  PROM Rt shoulder    11/21/22 UBE L 3 3 min fwd/3 min back Standing shoulder flex and abd 2x10 RUE ER/IR Red 2x10 Triceps ext 45# and Bicep 25# 2 sets 15 each PROM RT shld   11/16/22 UBE  L 3 3 min fwd/3 min back Red tband shld ext and row 2 sets 15  Red tband ER 2 sets 10,, IR 2 sets 10 Standing shoulder flex and abd 2x10 Triceps ext 45# and Bicep 25# 2 sets 10 each PROM RT shld  11/08/22 UBE L 3 3 min fwd/3 min back Red tband shld ext and row 2 sets 15 Red tband ER 2 sets 10,, IR 2 sets 10 3# upper cuts and hitchhiker 2 sets 10 Triceps ext 35# and Bicep 25# 2 sets 10 each 10# shruggs and rolls 15 x each OH press, shld flex,abd chest press,ER 2 sets 10 3# prone I T W PROM RT shld  11/06/22 UBE 3 min fwd  and back Func reaching standing various angles and directions 3# 3 sets 10 Attempted standing and SL abd but pain and catch PROM and STW also with painful catch esp with esp Prone ITW variations with 3# PROM prone   11/01/22 UBE L 3 3 fwd/3 back Lat pull and row 20# 2 sets 10 Chest press 15# 2 sets 10 with serratus Pulleys ext ,row,IR ,ER Rhy stab red tband DN to RT bicep PROM Rt shld   10/30/22 UBE L 3 3 min fwd and back PROM and stretching RT UE- more painful and guarded Supine 2# chest press and flex 12 x SL 2# abd and ER 12 x Seated row 2 sets 10 25# Lat Pull 25# 2 sets 10 Chest press with serratus 10 # 2 sets 10        PATIENT EDUCATION: Education details: POC, initial HEP Person educated: Patient and Spouse Education method: Explanation and Demonstration Education comprehension: verbalized understanding   HOME EXERCISE PROGRAM: [id number]   ASSESSMENT:   CLINICAL IMPRESSION-  Pt has progressed increasing his R shoulder AROM. 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  Continued with RUE and scapular strengthening. Some end range tightness with PROM. R shoulder passive abduction is the most limited.  objectIVE IMPAIRMENTS: decreased mobility, decreased ROM, decreased strength, increased edema, increased muscle spasms, impaired flexibility, impaired sensation, impaired UE functional use, postural dysfunction, and pain.    ACTIVITY LIMITATIONS: carrying, lifting, bending, sleeping, bathing, toileting, dressing, reach over head, and hygiene/grooming   PARTICIPATION LIMITATIONS: meal prep, cleaning, laundry, interpersonal relationship, driving, shopping, community activity, occupation, and yard work   PERSONAL FACTORS: Past/current experiences are also affecting patient's functional outcome.    REHAB POTENTIAL: Good   CLINICAL DECISION MAKING: Evolving/moderate complexity   EVALUATION COMPLEXITY: Moderate     GOALS: Goals  reviewed with patient? Yes   SHORT TERM GOALS: Target date: 08/22/22   I with initial HEP Baseline: Goal status: 09/20/22 met   LONG TERM GOALS: Target date: 11/22/22   I with final HEP Baseline:  Goal status: met 08/30/22   2.  Increase R shoulder strength to at least 4/5 Baseline:  Goal status: INITIAL  10/09/22 progressing 10/25/22 on going  11/08/22 progressing, 11/29/22 Progressing   3.  Increase R shoulder ROM to WNL, following appropriate protocol restrictions Baseline:  Goal status: INITIAL 10/09/22 progressing  10/16/22 on going  10/25/22 progressing 11/01/22 progressing and 11/08/22   4.  Patient will be able to complete all of his required daily activities with R shoulder pain < 3/10, following all appropriate restrictions Baseline:  Goal status: INITIAL  progressing 10/25/22 and 11/08/22, Met 11/29/22 for ADL, not farm stuff   5.  Patient will be able to lift 10#  weight overhead with good control, in RUE, pain < 3/10 Baseline:  Goal status: INITIAL  11/29/22 on going 6.  Increase FOTO score to at least 59            Baseline: 25            Goal status: INITIAL PLAN:   PT FREQUENCY: 2x/week   PT DURATION: 12 weeks   PLANNED INTERVENTIONS: Therapeutic exercises, Therapeutic activity, Neuromuscular re-education, Balance training, Gait training, Patient/Family education, Self Care, Joint mobilization, Dry Needling, Electrical stimulation, Cryotherapy, Moist heat, Taping, Vasopneumatic device, Ultrasound, Ionotophoresis 4mg /ml Dexamethasone, and Manual therapy   PLAN FOR NEXT SESSION: assess and progress        Sumner Boast, PT 11/29/2022, 12:41 PM

## 2022-12-04 ENCOUNTER — Ambulatory Visit: Payer: BC Managed Care – PPO | Attending: Orthopaedic Surgery | Admitting: Physical Therapy

## 2022-12-04 ENCOUNTER — Encounter: Payer: Self-pay | Admitting: Physical Therapy

## 2022-12-04 DIAGNOSIS — R293 Abnormal posture: Secondary | ICD-10-CM | POA: Insufficient documentation

## 2022-12-04 DIAGNOSIS — M6281 Muscle weakness (generalized): Secondary | ICD-10-CM | POA: Insufficient documentation

## 2022-12-04 DIAGNOSIS — M25611 Stiffness of right shoulder, not elsewhere classified: Secondary | ICD-10-CM | POA: Diagnosis not present

## 2022-12-04 DIAGNOSIS — M25511 Pain in right shoulder: Secondary | ICD-10-CM | POA: Diagnosis not present

## 2022-12-04 NOTE — Therapy (Signed)
PT End of Session - 12/04/22 1148     Visit Number 18    Date for PT Re-Evaluation 01/29/23    PT Start Time 1148    PT Stop Time 1230    PT Time Calculation (min) 42 min    Activity Tolerance Patient tolerated treatment well    Behavior During Therapy [facility] for tasks assessed/performed                         OUTPATIENT PHYSICAL THERAPY SHOULDER TREATMENT     Patient Name: Bruce Cobb MRN: [id number] DOB:[birth year], 57 y.o., male Today's Date: 08/01/2022              Past Medical History:  Diagnosis Date   BCC (basal cell carcinoma of skin)     Hematospermia      hx of, remotely    Paroxysmal atrial fibrillation ([place]) 09/2017    saw cards   Pars defect of lumbar spine     PONV (postoperative nausea and vomiting)     Right knee DJD     Shingles 2014    face         Past Surgical History:  Procedure Laterality Date   ANKLE FRACTURE SURGERY Left 1996    plated   KNEE ARTHROSCOPY Left 0000000    plica removed   KNEE ARTHROSCOPY Right      7 total   NASAL SEPTUM SURGERY       ORIF DISTAL RADIUS FRACTURE Right      age 44   REVISION TOTAL KNEE ARTHROPLASTY Right 08/19/2017   REVISION TOTAL SHOULDER TO REVERSE TOTAL SHOULDER Left 10/11/2021    Procedure: LEFT REVISION TOTAL SHOULDER TO REVERSE TOTAL SHOULDER, RIGHT SHOULDER STERIOD INJECTION;  Surgeon: Hiram Gash, MD;  Location: [facility];  Service: Orthopedics;  Laterality: Left;   SHOULDER ARTHROSCOPY W/ ROTATOR CUFF REPAIR Right 2011    bicep tendon and labrium repair   SHOULDER ARTHROSCOPY WITH ROTATOR CUFF REPAIR Left 2012    w/ labrium repair   TOTAL KNEE ARTHROPLASTY Right 10/12/2013    Procedure: RIGHT TOTAL KNEE ARTHROPLASTY;  Surgeon: Lorn Junes, MD;  Location: [place];  Service: Orthopedics;  Laterality: Right;   VASECTOMY            Patient Active Problem List    Diagnosis Date Noted   BCC (basal cell carcinoma of skin) 06/29/2019   Chronic leukopenia 09/24/2018   PCP NOTES >>>>>>>>>>>>>>  09/24/2018   Anxiety 09/24/2018   Insomnia 09/24/2018   History of atrial fibrillation 09/24/2018   Irregular heart rhythm 02/24/2016   Dysfunction of both eustachian tubes 01/18/2016   DJD (degenerative joint disease) 10/12/2013   Pars defect of lumbar spine        PCP: N/A   REFERRING PROVIDER: Ophelia Charter, MD   REFERRING DIAG: R TSR   THERAPY DIAG:  Muscle weakness (generalized)   Abnormal posture   Acute pain of right shoulder   Stiffness of right shoulder, not elsewhere classified   Rationale for Evaluation and Treatment: Rehabilitation   ONSET DATE: 07/30/22   SUBJECTIVE:  SUBJECTIVE STATEMENT:  "Back spasms other than that ok"   PERTINENT HISTORY: PMHx-LTSR 12/21, L TSR revision 10/11/21, TKE and revision, basal cell, PAF   PAIN:  Are you having pain? Yes: NPRS scale: 1/10 Pain location: R shoulder Pain description: sharp, post op Aggravating factors: moving at night Relieving factors: Not much helps currently.   PRECAUTIONS: Shoulder   WEIGHT BEARING RESTRICTIONS: No   FALLS:  Has patient fallen in last 6 months? 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, farming   PLOF: Independent   PATIENT GOALS:Recover his ability to resume his normal activities   NEXT MD VISIT:    OBJECTIVE:    DIAGNOSTIC FINDINGS:  N/A   PATIENT SURVEYS:  FOTO 25   COGNITION: Overall cognitive status: Within functional limits for tasks assessed                                  SENSATION: Not tested   POSTURE: Rounded shoulders   UPPER EXTREMITY ROM:    Passive ROM Right eval Left eval 08/30/22 RT shld supine 09/06/21 RT shld supine 09/11/22 RT shld  Active /Passive Supine  09/20/22 RT shld supine Act/Passive 10/09/22 Supine ACT/PASS 10/25/22 ACT/PASS standing 11/08/22  Act Standing 11/29/22 Act Standing  Shoulder flexion 71   132 155 130/163 150/170 165/170 170/180 170 175  Shoulder extension              Shoulder abduction     92 102 90/110 115/133 125/150 140/150 158 165  Shoulder adduction              Shoulder internal rotation 51   65    Act 78 ACT 75    Shoulder external rotation 58   20 32 45/52 (45 degrees abd) 45/52(45 degrees abd) 50/60 ( at 45 degrees abd) 70/76 at 90 degrees abd    Elbow flexion              Elbow extension              Wrist flexion              Wrist extension              Wrist ulnar deviation              Wrist radial deviation              Wrist pronation              Wrist supination              (Blank rows = not tested)   UPPER EXTREMITY MMT: Strength testing of R shoulder deferred. Active movement in R elbow, wrist, hand/fingers     PALPATION:  Swelling noted throughout RUE from shoulder to hand. Ptient reports tenderness over incision.             TODAY'S TREATMENT:     12/04/22 UBE L3.5 x 3 min each Ladder flex & abductions stretch x 5each  Shoulder Flex & abd 2lb 2x10 each RUE green ER/IR 2x10 Seated Rows 35lb & Lats 45lb 2x15 PROM Rt shoulder   11/29/27 UBE L3 x 3 min each Ladder flex & abductions stretch x 7 each  Seated Rows 35lb & Lats 45lb 2x10 Shoulder Ext 10lb 2x10 ER red 2x10  PROM Rt shoulder    11/21/22 UBE L 3 3 min fwd/3 min back Standing shoulder flex and abd 2x10 RUE ER/IR Red 2x10 Triceps ext 45# and Bicep 25# 2 sets 15 each PROM RT shld   11/16/22 UBE L 3 3 min fwd/3 min back Red tband shld ext and row 2 sets 15  Red tband ER 2 sets 10,, IR 2 sets 10 Standing shoulder flex and abd 2x10 Triceps ext 45# and Bicep 25# 2 sets 10 each PROM RT shld  11/08/22 UBE L 3 3 min fwd/3 min back Red tband shld ext and row 2 sets 15 Red tband ER 2 sets 10,, IR 2 sets 10 3# upper cuts and  hitchhiker 2 sets 10 Triceps ext 35# and Bicep 25# 2 sets 10 each 10# shruggs and rolls 15 x each OH press, shld flex,abd chest press,ER 2 sets 10 3# prone I T W PROM RT shld  11/06/22 UBE 3 min fwd and back Func reaching standing various angles and directions 3# 3 sets 10 Attempted standing and SL abd but pain and catch PROM and STW also with painful catch esp with esp Prone ITW variations with 3# PROM prone   11/01/22 UBE L 3 3 fwd/3 back Lat pull and row 20# 2 sets 10 Chest press 15# 2 sets 10 with serratus Pulleys ext ,row,IR ,ER Rhy stab red tband DN to RT bicep PROM Rt shld   PATIENT EDUCATION: Education details: POC, initial HEP Person educated: Patient and Spouse Education method: Customer service manager Education comprehension: verbalized understanding   HOME EXERCISE PROGRAM: [id number]   ASSESSMENT:   CLINICAL IMPRESSION-  Again he reports no pain with ADL's but 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  R shoulder elevation present with standing shoulder flexion and abduction.  Some end range tightness with PROM. R shoulder passive abduction is the most limited.  objectIVE IMPAIRMENTS: decreased mobility, decreased ROM, decreased strength, increased edema, increased muscle spasms, impaired flexibility, impaired sensation, impaired UE functional use, postural dysfunction, and pain.    ACTIVITY LIMITATIONS: carrying, lifting, bending, sleeping, bathing, toileting, dressing, reach over head, and hygiene/grooming   PARTICIPATION LIMITATIONS: meal prep, cleaning, laundry, interpersonal relationship, driving, shopping, community activity, occupation, and yard work   PERSONAL FACTORS: Past/current experiences are also affecting patient's functional outcome.    REHAB POTENTIAL: Good   CLINICAL DECISION MAKING: Evolving/moderate complexity   EVALUATION COMPLEXITY: Moderate     GOALS: Goals reviewed with patient? Yes   SHORT TERM GOALS: Target date:  08/22/22   I with initial HEP Baseline: Goal status: 09/20/22 met   LONG TERM GOALS: Target date: 11/22/22   I with final HEP Baseline:  Goal status: met 08/30/22   2.  Increase R shoulder strength to at least 4/5 Baseline:  Goal status: INITIAL  10/09/22 progressing 10/25/22 on going  11/08/22 progressing, 11/29/22 Progressing   3.  Increase R shoulder ROM to WNL, following appropriate protocol restrictions Baseline:  Goal status: INITIAL 10/09/22 progressing  10/16/22 on going  10/25/22 progressing 11/01/22 progressing and 11/08/22   4.  Patient will be able to complete all of his required daily activities with R shoulder pain < 3/10, following all appropriate restrictions Baseline:  Goal status: INITIAL  progressing 10/25/22 and 11/08/22, Met 11/29/22 for ADL, not farm stuff   5.  Patient will be able to lift 10# weight overhead with good control, in RUE, pain < 3/10 Baseline:  Goal status: INITIAL  11/29/22 on going 6.  Increase FOTO score to at least 59            Baseline: 25            Goal status: INITIAL PLAN:   PT FREQUENCY: 2x/week   PT DURATION: 12 weeks   PLANNED INTERVENTIONS: Therapeutic exercises, Therapeutic activity, Neuromuscular re-education, Balance training, Gait training, Patient/Family education, Self Care, Joint mobilization, Dry Needling, Electrical stimulation, Cryotherapy, Moist heat, Taping, Vasopneumatic device, Ultrasound, Ionotophoresis 4mg /ml Dexamethasone, and Manual therapy   PLAN FOR NEXT SESSION: assess and progress        Scot Jun, PTA 12/04/2022, 11:48 AM

## 2022-12-13 ENCOUNTER — Ambulatory Visit: Payer: BC Managed Care – PPO | Admitting: Physical Therapy

## 2022-12-17 DIAGNOSIS — M25519 Pain in unspecified shoulder: Secondary | ICD-10-CM | POA: Diagnosis not present

## 2022-12-17 DIAGNOSIS — R030 Elevated blood-pressure reading, without diagnosis of hypertension: Secondary | ICD-10-CM | POA: Diagnosis not present

## 2022-12-17 DIAGNOSIS — J310 Chronic rhinitis: Secondary | ICD-10-CM | POA: Diagnosis not present

## 2022-12-17 DIAGNOSIS — Z79899 Other long term (current) drug therapy: Secondary | ICD-10-CM | POA: Diagnosis not present

## 2022-12-17 DIAGNOSIS — G8929 Other chronic pain: Secondary | ICD-10-CM | POA: Diagnosis not present

## 2022-12-18 ENCOUNTER — Encounter: Payer: Self-pay | Admitting: Physical Therapy

## 2022-12-18 ENCOUNTER — Ambulatory Visit: Payer: BC Managed Care – PPO | Admitting: Physical Therapy

## 2022-12-18 DIAGNOSIS — R293 Abnormal posture: Secondary | ICD-10-CM | POA: Diagnosis not present

## 2022-12-18 DIAGNOSIS — M25511 Pain in right shoulder: Secondary | ICD-10-CM

## 2022-12-18 DIAGNOSIS — M25611 Stiffness of right shoulder, not elsewhere classified: Secondary | ICD-10-CM | POA: Diagnosis not present

## 2022-12-18 DIAGNOSIS — M6281 Muscle weakness (generalized): Secondary | ICD-10-CM

## 2022-12-18 NOTE — Therapy (Signed)
PT End of Session - 12/18/22 1258     Visit Number 19    Date for PT Re-Evaluation 01/29/23    PT Start Time 1300    PT Stop Time 1345    PT Time Calculation (min) 45 min    Activity Tolerance Patient tolerated treatment well    Behavior During Therapy [facility] for tasks assessed/performed                         OUTPATIENT PHYSICAL THERAPY SHOULDER TREATMENT     Patient Name: Bruce Cobb MRN: [id number] DOB:08/09/1966, 57 y.o., male Today's Date: 08/01/2022              Past Medical History:  Diagnosis Date   BCC (basal cell carcinoma of skin)     Hematospermia      hx of, remotely    Paroxysmal atrial fibrillation (HCC) 09/2017    saw cards   Pars defect of lumbar spine     PONV (postoperative nausea and vomiting)     Right knee DJD     Shingles 2014    face         Past Surgical History:  Procedure Laterality Date   ANKLE FRACTURE SURGERY Left 1996    plated   KNEE ARTHROSCOPY Left 1990    plica removed   KNEE ARTHROSCOPY Right      7 total   NASAL SEPTUM SURGERY       ORIF DISTAL RADIUS FRACTURE Right      age 39   REVISION TOTAL KNEE ARTHROPLASTY Right 08/19/2017   REVISION TOTAL SHOULDER TO REVERSE TOTAL SHOULDER Left 10/11/2021    Procedure: LEFT REVISION TOTAL SHOULDER TO REVERSE TOTAL SHOULDER, RIGHT SHOULDER STERIOD INJECTION;  Surgeon: Bjorn Pippin, MD;  Location: [facility];  Service: Orthopedics;  Laterality: Left;   SHOULDER ARTHROSCOPY W/ ROTATOR CUFF REPAIR Right 2011    bicep tendon and labrium repair   SHOULDER ARTHROSCOPY WITH ROTATOR CUFF REPAIR Left 2012    w/ labrium repair   TOTAL KNEE ARTHROPLASTY Right 10/12/2013    Procedure: RIGHT TOTAL KNEE ARTHROPLASTY;  Surgeon: Nilda Simmer, MD;  Location: [facility];  Service: Orthopedics;  Laterality: Right;   VASECTOMY            Patient Active Problem List    Diagnosis Date Noted   BCC (basal cell carcinoma of skin) 06/29/2019   Chronic leukopenia 09/24/2018   PCP NOTES >>>>>>>>>>>>>>  09/24/2018   Anxiety 09/24/2018   Insomnia 09/24/2018   History of atrial fibrillation 09/24/2018   Irregular heart rhythm 02/24/2016   Dysfunction of both eustachian tubes 01/18/2016   DJD (degenerative joint disease) 10/12/2013   Pars defect of lumbar spine        PCP: N/A   REFERRING PROVIDER: Ramond Marrow, MD   REFERRING DIAG: R TSR   THERAPY DIAG:  Muscle weakness (generalized)   Abnormal posture   Acute pain of right shoulder   Stiffness of right shoulder, not elsewhere classified   Rationale for Evaluation and Treatment: Rehabilitation   ONSET DATE: 07/30/22   SUBJECTIVE:  SUBJECTIVE STATEMENT: "Both shoulders are stiff today"    PERTINENT HISTORY: PMHx-LTSR 12/21, L TSR revision 10/11/21, TKE and revision, basal cell, PAF   PAIN:  Are you having pain? Yes: NPRS scale: 5/10 Pain location: R shoulder Pain description: discomfort Aggravating factors: moving at night Relieving factors: Not much helps currently.   PRECAUTIONS: Shoulder   WEIGHT BEARING RESTRICTIONS: No   FALLS:  Has patient fallen in last 6 months? No   LIVING ENVIRONMENT: Lives with: lives with their family Lives in: House/apartment Stairs: N/A     OCCUPATION: Real estate Therapist, sports, farming   PLOF: Independent   PATIENT GOALS:Recover his ability to resume his normal activities   NEXT MD VISIT:    OBJECTIVE:    DIAGNOSTIC FINDINGS:  N/A   PATIENT SURVEYS:  FOTO 25   COGNITION: Overall cognitive status: Within functional limits for tasks assessed                                  SENSATION: Not tested   POSTURE: Rounded shoulders   UPPER EXTREMITY ROM:    Passive ROM Right eval Left eval 08/30/22 RT shld supine 09/06/21 RT shld supine 09/11/22 RT shld  Active /Passive Supine  09/20/22 RT shld supine Act/Passive 10/09/22 Supine ACT/PASS 10/25/22 ACT/PASS standing 11/08/22  Act Standing 11/29/22 Act Standing 12/18/22 Act standing  Shoulder flexion 71   132 155 130/163 150/170 165/170 170/180 170 175 176  Shoulder extension               Shoulder abduction     92 102 90/110 115/133 125/150 140/150 158 165 162  Shoulder adduction               Shoulder internal rotation 51   65    Act 78 ACT 75     Shoulder external rotation 58   20 32 45/52 (45 degrees abd) 45/52(45 degrees abd) 50/60 ( at 45 degrees abd) 70/76 at 90 degrees abd     Elbow flexion               Elbow extension               Wrist flexion               Wrist extension               Wrist ulnar deviation               Wrist radial deviation               Wrist pronation               Wrist supination               (Blank rows = not tested)   UPPER EXTREMITY MMT: Strength testing of R shoulder deferred. Active movement in R elbow, wrist, hand/fingers     PALPATION:  Swelling noted throughout RUE from shoulder to hand. Ptient reports tenderness over incision.             TODAY'S TREATMENT:     12/18/22 UBE L3.5 x 3 min each Ladder flex & abductions stretch x 5each  Seated Rows 35lb & Lats 45lb 2x15 RUE blue ER/IR 2x10 Shoulder Ext 10lb 2x15 PROM Rt shoulder   12/04/22 UBE L3.5 x 3 min each Ladder flex & abductions stretch x 5each  Shoulder Flex & abd 2lb  2x10 each RUE green ER/IR 2x10 Seated Rows 35lb & Lats 45lb 2x15 PROM Rt shoulder   11/29/27 UBE L3 x 3 min each Ladder flex & abductions stretch x 7 each  Seated Rows 35lb & Lats 45lb 2x10 Shoulder Ext 10lb 2x10 ER red 2x10  PROM Rt shoulder    PATIENT EDUCATION: Education details: POC, initial HEP Person educated: Patient and Spouse Education method: Medical illustrator Education comprehension: verbalized understanding   HOME EXERCISE PROGRAM: [email or web address]   ASSESSMENT:   CLINICAL IMPRESSION-  Again he reports no  pain with ADL's but 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 ROM remains well overall. Weakness remains with R shoulder abduction and flexion. He has good strength with RUE ER/IR. No pain with strengthening interventions. Some tightness with end range PROM.  objectIVE IMPAIRMENTS: decreased mobility, decreased ROM, decreased strength, increased edema, increased muscle spasms, impaired flexibility, impaired sensation, impaired UE functional use, postural dysfunction, and pain.    ACTIVITY LIMITATIONS: carrying, lifting, bending, sleeping, bathing, toileting, dressing, reach over head, and hygiene/grooming   PARTICIPATION LIMITATIONS: meal prep, cleaning, laundry, interpersonal relationship, driving, shopping, community activity, occupation, and yard work   PERSONAL FACTORS: Past/current experiences are also affecting patient's functional outcome.    REHAB POTENTIAL: Good   CLINICAL DECISION MAKING: Evolving/moderate complexity   EVALUATION COMPLEXITY: Moderate     GOALS: Goals reviewed with patient? Yes   SHORT TERM GOALS: Target date: 08/22/22   I with initial HEP Baseline: Goal status: 09/20/22 met   LONG TERM GOALS: Target date: 11/22/22   I with final HEP Baseline:  Goal status: met 08/30/22   2.  Increase R shoulder strength to at least 4/5 Baseline:  Goal status: RUE abd 4-/5, Flex 4/5, ER/IR 5/5  12/18/22 Progressing   3.  Increase R shoulder ROM to WNL, following appropriate protocol restrictions Baseline:  Goal status: Met 12/18/22    4.  Patient will be able to complete all of his required daily activities with R shoulder pain < 3/10, following all appropriate restrictions Baseline:  Goal status: Met 11/29/22 for AD, not farm stuff   5.  Patient will be able to lift 10# weight overhead with good control, in RUE, pain < 3/10 Baseline:  Goal status: INITIAL  11/29/22 on going  6.  Increase FOTO score to at least 59            Baseline: 25            Goal status:  INITIAL PLAN:   PT FREQUENCY: 2x/week   PT DURATION: 12 weeks   PLANNED INTERVENTIONS: Therapeutic exercises, Therapeutic activity, Neuromuscular re-education, Balance training, Gait training, Patient/Family education, Self Care, Joint mobilization, Dry Needling, Electrical stimulation, Cryotherapy, Moist heat, Taping, Vasopneumatic device, Ultrasound, Ionotophoresis /ml Dexamethasone, and Manual therapy   PLAN FOR NEXT SESSION: assess and progress        Grayce Sessions, PTA 12/18/2022, 1:00 PM

## 2022-12-19 DIAGNOSIS — Z79899 Other long term (current) drug therapy: Secondary | ICD-10-CM | POA: Diagnosis not present

## 2022-12-25 ENCOUNTER — Ambulatory Visit: Payer: BC Managed Care – PPO | Admitting: Physical Therapy

## 2022-12-25 DIAGNOSIS — M25611 Stiffness of right shoulder, not elsewhere classified: Secondary | ICD-10-CM | POA: Diagnosis not present

## 2022-12-25 DIAGNOSIS — R293 Abnormal posture: Secondary | ICD-10-CM

## 2022-12-25 DIAGNOSIS — M25511 Pain in right shoulder: Secondary | ICD-10-CM

## 2022-12-25 DIAGNOSIS — M6281 Muscle weakness (generalized): Secondary | ICD-10-CM | POA: Diagnosis not present

## 2022-12-25 NOTE — Therapy (Signed)
PT End of Session - 12/25/22 1147     Visit Number 20    Number of Visits 30    Date for PT Re-Evaluation 01/29/23    PT Start Time 1145    PT Stop Time 1230    PT Time Calculation (min) 45 min                         OUTPATIENT PHYSICAL THERAPY SHOULDER TREATMENT     Patient Name: Bruce Cobb MRN: [id number] DOB:1966/06/12, 57 y.o., male Today's Date: 08/01/2022              Past Medical History:  Diagnosis Date   BCC (basal cell carcinoma of skin)     Hematospermia      hx of, remotely    Paroxysmal atrial fibrillation (HCC) 09/2017    saw cards   Pars defect of lumbar spine     PONV (postoperative nausea and vomiting)     Right knee DJD     Shingles 2014    face         Past Surgical History:  Procedure Laterality Date   ANKLE FRACTURE SURGERY Left 1996    plated   KNEE ARTHROSCOPY Left 1990    plica removed   KNEE ARTHROSCOPY Right      7 total   NASAL SEPTUM SURGERY       ORIF DISTAL RADIUS FRACTURE Right      age 76   REVISION TOTAL KNEE ARTHROPLASTY Right 08/19/2017   REVISION TOTAL SHOULDER TO REVERSE TOTAL SHOULDER Left 10/11/2021    Procedure: LEFT REVISION TOTAL SHOULDER TO REVERSE TOTAL SHOULDER, RIGHT SHOULDER STERIOD INJECTION;  Surgeon: Bjorn Pippin, MD;  Location: [facility];  Service: Orthopedics;  Laterality: Left;   SHOULDER ARTHROSCOPY W/ ROTATOR CUFF REPAIR Right 2011    bicep tendon and labrium repair   SHOULDER ARTHROSCOPY WITH ROTATOR CUFF REPAIR Left 2012    w/ labrium repair   TOTAL KNEE ARTHROPLASTY Right 10/12/2013    Procedure: RIGHT TOTAL KNEE ARTHROPLASTY;  Surgeon: Nilda Simmer, MD;  Location: [facility];  Service: Orthopedics;  Laterality: Right;   VASECTOMY            Patient Active Problem List    Diagnosis Date Noted   BCC (basal cell carcinoma of skin) 06/29/2019   Chronic leukopenia 09/24/2018   PCP NOTES >>>>>>>>>>>>>> 09/24/2018   Anxiety 09/24/2018   Insomnia 09/24/2018   History of atrial fibrillation 09/24/2018    Irregular heart rhythm 02/24/2016   Dysfunction of both eustachian tubes 01/18/2016   DJD (degenerative joint disease) 10/12/2013   Pars defect of lumbar spine        PCP: N/A   REFERRING PROVIDER: Ramond Marrow, MD   REFERRING DIAG: R TSR   THERAPY DIAG:  Muscle weakness (generalized)   Abnormal posture   Acute pain of right shoulder   Stiffness of right shoulder, not elsewhere classified   Rationale for Evaluation and Treatment: Rehabilitation   ONSET DATE: 07/30/22   SUBJECTIVE:  SUBJECTIVE STATEMENT: Both shoulders feel the same- stiff. Lots of farm work   PERTINENT HISTORY: PMHx-LTSR 12/21, L TSR revision 10/11/21, TKE and revision, basal cell, PAF   PAIN:  Are you having pain? Yes: NPRS scale: 5/10 Pain location: R shoulder Pain description: discomfort Aggravating factors: moving at night Relieving factors: Not much helps currently.   PRECAUTIONS: Shoulder   WEIGHT BEARING RESTRICTIONS: No   FALLS:  Has patient fallen in last 6 months? No   LIVING ENVIRONMENT: Lives with: lives with their family Lives in: House/apartment Stairs: N/A     OCCUPATION: Real estate Therapist, sports, farming   PLOF: Independent   PATIENT GOALS:Recover his ability to resume his normal activities   NEXT MD VISIT:    OBJECTIVE:    DIAGNOSTIC FINDINGS:  N/A   PATIENT SURVEYS:  FOTO 25   COGNITION: Overall cognitive status: Within functional limits for tasks assessed                                  SENSATION: Not tested   POSTURE: Rounded shoulders   UPPER EXTREMITY ROM:    Passive ROM Right eval Left eval 08/30/22 RT shld supine 09/06/21 RT shld supine 09/11/22 RT shld  Active /Passive Supine 09/20/22 RT shld supine Act/Passive 10/09/22 Supine ACT/PASS  10/25/22 ACT/PASS standing 11/08/22  Act Standing 11/29/22 Act Standing 12/18/22 Act standing  Shoulder flexion 71   132 155 130/163 150/170 165/170 170/180 170 175 176  Shoulder extension               Shoulder abduction     92 102 90/110 115/133 125/150 140/150 158 165 162  Shoulder adduction               Shoulder internal rotation 51   65    Act 78 ACT 75     Shoulder external rotation 58   20 32 45/52 (45 degrees abd) 45/52(45 degrees abd) 50/60 ( at 45 degrees abd) 70/76 at 90 degrees abd     Elbow flexion               Elbow extension               Wrist flexion               Wrist extension               Wrist ulnar deviation               Wrist radial deviation               Wrist pronation               Wrist supination               (Blank rows = not tested)   UPPER EXTREMITY MMT: Strength testing of R shoulder deferred. Active movement in R elbow, wrist, hand/fingers     PALPATION:  Swelling noted throughout RUE from shoulder to hand. Ptient reports tenderness over incision.             TODAY'S TREATMENT:      12/25/22 UBE L 4 2 min fwd/ 2 min backward Pulley 15# shld ext 2 sets 10 Pulley rows 20# 2 sets 10 Incline pull up 2 sets 10 5# PNF 2 sets 10 5# OH press 10 x  8# 10 x 5# shld flex 10 x 8# 10  x 5# shld abd 10 x 8# 10 x PROM BIL shld, pain with ER on RT Rec ant shdl and pec stretching as 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[employer]   12/18/22 UBE L3.5 x 3 min each Ladder flex & abductions stretch x 5each  Seated Rows 35lb & Lats 45lb 2x15 RUE blue ER/IR 2x10 Shoulder Ext 10lb 2x15 PROM Rt shoulder   12/04/22 UBE L3.5 x 3 min each Ladder flex & abductions stretch x 5each  Shoulder Flex & abd 2lb 2x10 each RUE green ER/IR 2x10 Seated Rows 35lb & Lats 45lb 2x15 PROM Rt shoulder   11/29/27 UBE L3 x 3 min each Ladder flex & abductions stretch x 7 each  Seated Rows 35lb & Lats 45lb 2x10 Shoulder Ext 10lb 2x10 ER red 2x10  PROM  Rt shoulder    PATIENT EDUCATION: Education details: POC, initial HEP Person educated: Patient and Spouse Education method: Medical illustrator Education comprehension: verbalized understanding   HOME EXERCISE PROGRAM: [id number]   ASSESSMENT:   CLINICAL IMPRESSION-  .PROM BIL shld, pain with ER on RT Rec ant shld and pec stretching as he 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 Progressing with strength and func   objectIVE IMPAIRMENTS: decreased mobility, decreased ROM, decreased strength, increased edema, increased muscle spasms, impaired flexibility, impaired sensation, impaired UE functional use, postural dysfunction, and pain.    ACTIVITY LIMITATIONS: carrying, lifting, bending, sleeping, bathing, toileting, dressing, reach over head, and hygiene/grooming   PARTICIPATION LIMITATIONS: meal prep, cleaning, laundry, interpersonal relationship, driving, shopping, community activity, occupation, and yard work   PERSONAL FACTORS: Past/current experiences are also affecting patient's functional outcome.    REHAB POTENTIAL: Good   CLINICAL DECISION MAKING: Evolving/moderate complexity   EVALUATION COMPLEXITY: Moderate     GOALS: Goals reviewed with patient? Yes   SHORT TERM GOALS: Target date: 08/22/22   I with initial HEP Baseline: Goal status: 09/20/22 met   LONG TERM GOALS: Target date: 11/22/22   I with final HEP Baseline:  Goal status: met 08/30/22   2.  Increase R shoulder strength to at least 4/5 Baseline:  Goal status: RUE abd 4-/5, Flex 4/5, ER/IR 5/5  12/18/22 Progressing 12/25/22  3.  Increase R shoulder ROM to WNL, following appropriate protocol restrictions Baseline:  Goal status: Met 12/18/22    4.  Patient will be able to complete all of his required daily activities with R shoulder pain < 3/10, following all appropriate restrictions Baseline:  Goal status: Met 11/29/22 for AD, not farm stuff   5.  Patient will be  able to lift 10# weight overhead with good control, in RUE, pain < 3/10 Baseline:  Goal status: INITIAL  11/29/22 on going  progressing 12/25/22  6.  Increase FOTO score to at least 59            Baseline: 25            Goal status: INITIAL PLAN:   PT FREQUENCY: 2x/week   PT DURATION: 12 weeks   PLANNED INTERVENTIONS: Therapeutic exercises, Therapeutic activity, Neuromuscular re-education, Balance training, Gait training, Patient/Family education, Self Care, Joint mobilization, Dry Needling, Electrical stimulation, Cryotherapy, Moist heat, Taping, Vasopneumatic device, Ultrasound, Ionotophoresis /ml Dexamethasone, and Manual therapy   PLAN FOR NEXT SESSION: assess and progress        Bruce Cobb,Bruce Cobb, PTA 12/25/2022, 11:48 [facility] at [facility] [address]  [employer] [place], [place], [address] Phone: [phone]   Fax:  [phone]  Patient Details  Name: Bruce Cobb MRN: [id number] Date of Birth: July 17, 1966 Referring Provider:  No ref. provider found  Encounter Date: 12/25/2022   Nicola Girt 12/25/2022, 11:48 AM  [facility] at [facility] [address] [facility]. [place], [place], [address] Phone: [phone]   Fax:  [phone]

## 2023-01-01 ENCOUNTER — Ambulatory Visit: Payer: BC Managed Care – PPO | Admitting: Physical Therapy

## 2023-01-01 ENCOUNTER — Encounter: Payer: Self-pay | Admitting: Physical Medicine and Rehabilitation

## 2023-01-01 ENCOUNTER — Encounter: Payer: Self-pay | Admitting: Physical Therapy

## 2023-01-01 DIAGNOSIS — M6281 Muscle weakness (generalized): Secondary | ICD-10-CM

## 2023-01-01 DIAGNOSIS — R293 Abnormal posture: Secondary | ICD-10-CM | POA: Diagnosis not present

## 2023-01-01 DIAGNOSIS — M25511 Pain in right shoulder: Secondary | ICD-10-CM

## 2023-01-01 DIAGNOSIS — M25611 Stiffness of right shoulder, not elsewhere classified: Secondary | ICD-10-CM | POA: Diagnosis not present

## 2023-01-01 NOTE — Therapy (Signed)
PT End of Session - 01/01/23 1348     Visit Number 21    Date for PT Re-Evaluation 01/29/23    PT Start Time 1348    PT Stop Time 1430    PT Time Calculation (min) 42 min    Activity Tolerance Patient tolerated treatment well    Behavior During Therapy [facility] for tasks assessed/performed                         OUTPATIENT PHYSICAL THERAPY SHOULDER TREATMENT     Patient Name: Bruce Cobb MRN: [id number] DOB:Nov 12, 1965, 57 y.o., male Today's Date: 08/01/2022              Past Medical History:  Diagnosis Date   BCC (basal cell carcinoma of skin)     Hematospermia      hx of, remotely    Paroxysmal atrial fibrillation (HCC) 09/2017    saw cards   Pars defect of lumbar spine     PONV (postoperative nausea and vomiting)     Right knee DJD     Shingles 2014    face         Past Surgical History:  Procedure Laterality Date   ANKLE FRACTURE SURGERY Left 1996    plated   KNEE ARTHROSCOPY Left 1990    plica removed   KNEE ARTHROSCOPY Right      7 total   NASAL SEPTUM SURGERY       ORIF DISTAL RADIUS FRACTURE Right      age 53   REVISION TOTAL KNEE ARTHROPLASTY Right 08/19/2017   REVISION TOTAL SHOULDER TO REVERSE TOTAL SHOULDER Left 10/11/2021    Procedure: LEFT REVISION TOTAL SHOULDER TO REVERSE TOTAL SHOULDER, RIGHT SHOULDER STERIOD INJECTION;  Surgeon: Bjorn Pippin, MD;  Location: [facility];  Service: Orthopedics;  Laterality: Left;   SHOULDER ARTHROSCOPY W/ ROTATOR CUFF REPAIR Right 2011    bicep tendon and labrium repair   SHOULDER ARTHROSCOPY WITH ROTATOR CUFF REPAIR Left 2012    w/ labrium repair   TOTAL KNEE ARTHROPLASTY Right 10/12/2013    Procedure: RIGHT TOTAL KNEE ARTHROPLASTY;  Surgeon: Nilda Simmer, MD;  Location: [facility];  Service: Orthopedics;  Laterality: Right;   VASECTOMY            Patient Active Problem List    Diagnosis Date Noted   BCC (basal cell carcinoma of skin) 06/29/2019   Chronic leukopenia 09/24/2018   PCP NOTES >>>>>>>>>>>>>>  09/24/2018   Anxiety 09/24/2018   Insomnia 09/24/2018   History of atrial fibrillation 09/24/2018   Irregular heart rhythm 02/24/2016   Dysfunction of both eustachian tubes 01/18/2016   DJD (degenerative joint disease) 10/12/2013   Pars defect of lumbar spine        PCP: N/A   REFERRING PROVIDER: Ramond Marrow, MD   REFERRING DIAG: R TSR   THERAPY DIAG:  Muscle weakness (generalized)   Abnormal posture   Acute pain of right shoulder   Stiffness of right shoulder, not elsewhere classified   Rationale for Evaluation and Treatment: Rehabilitation   ONSET DATE: 07/30/22   SUBJECTIVE:  SUBJECTIVE STATEMENT:  Rough, washed and waxed a car yesterday   PERTINENT HISTORY: PMHx-LTSR 12/21, L TSR revision 10/11/21, TKE and revision, basal cell, PAF   PAIN:  Are you having pain? Yes: NPRS scale: 4/10 Pain location: R shoulder Pain description: discomfort Aggravating factors: moving at night Relieving factors: Not much helps currently.   PRECAUTIONS: Shoulder   WEIGHT BEARING RESTRICTIONS: No   FALLS:  Has patient fallen in last 6 months? No   LIVING ENVIRONMENT: Lives with: lives with their family Lives in: House/apartment Stairs: N/A     OCCUPATION: Real estate Therapist, sports, farming   PLOF: Independent   PATIENT GOALS:Recover his ability to resume his normal activities   NEXT MD VISIT:    OBJECTIVE:    DIAGNOSTIC FINDINGS:  N/A   PATIENT SURVEYS:  FOTO 25   COGNITION: Overall cognitive status: Within functional limits for tasks assessed                                  SENSATION: Not tested   POSTURE: Rounded shoulders   UPPER EXTREMITY ROM:    Passive ROM Right eval Left eval 08/30/22 RT shld supine 09/06/21 RT shld supine 09/11/22 RT shld  Active /Passive  Supine 09/20/22 RT shld supine Act/Passive 10/09/22 Supine ACT/PASS 10/25/22 ACT/PASS standing 11/08/22  Act Standing 11/29/22 Act Standing 12/18/22 Act standing  Shoulder flexion 71   132 155 130/163 150/170 165/170 170/180 170 175 176  Shoulder extension               Shoulder abduction     92 102 90/110 115/133 125/150 140/150 158 165 162  Shoulder adduction               Shoulder internal rotation 51   65    Act 78 ACT 75     Shoulder external rotation 58   20 32 45/52 (45 degrees abd) 45/52(45 degrees abd) 50/60 ( at 45 degrees abd) 70/76 at 90 degrees abd     Elbow flexion               Elbow extension               Wrist flexion               Wrist extension               Wrist ulnar deviation               Wrist radial deviation               Wrist pronation               Wrist supination               (Blank rows = not tested)   UPPER EXTREMITY MMT: Strength testing of R shoulder deferred. Active movement in R elbow, wrist, hand/fingers     PALPATION:  Swelling noted throughout RUE from shoulder to hand. Ptient reports tenderness over incision.             TODAY'S TREATMENT:     01/01/23 UBE L 4 3 min fwd/ 3 min backward 5# PNF 2 sets 10 5# OH press 10 x  8# 10 x 8lb shld flex 2x10 5# shld abd 10 x 8# 10 x Elevated push ups 3x10 Pulley 15# shld ext 2 sets 10 Pulley rows 20# 2 sets 10  PROM BIL shld, pain with ER on RT  12/25/22 UBE L 4 2 min fwd/ 2 min backward Pulley 15# shld ext 2 sets 10 Pulley rows 20# 2 sets 10 Incline pull up 2 sets 10 5# PNF 2 sets 10 5# OH press 10 x  8# 10 x 5# shld flex 10 x 8# 10 x 5# shld abd 10 x 8# 10 x PROM BIL shld, pain with ER on RT Rec ant shdl and pec stretching as 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[employer]   12/18/22 UBE L3.5 x 3 min each Ladder flex & abductions stretch x 5each  Seated Rows 35lb & Lats 45lb 2x15 RUE blue ER/IR 2x10 Shoulder Ext 10lb 2x15 PROM Rt shoulder   12/04/22 UBE L3.5 x 3  min each Ladder flex & abductions stretch x 5each  Shoulder Flex & abd 2lb 2x10 each RUE green ER/IR 2x10 Seated Rows 35lb & Lats 45lb 2x15 PROM Rt shoulder   11/29/27 UBE L3 x 3 min each Ladder flex & abductions stretch x 7 each  Seated Rows 35lb & Lats 45lb 2x10 Shoulder Ext 10lb 2x10 ER red 2x10  PROM Rt shoulder    PATIENT EDUCATION: Education details: POC, initial HEP Person educated: Patient and Spouse Education method: Medical illustrator Education comprehension: verbalized understanding   HOME EXERCISE PROGRAM: [email or web address]   ASSESSMENT:   CLINICAL IMPRESSION-  .PROM BIL shld, pain with ER on RT 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 Progressing with strength and func. Some difficulty with OHP. Postural cues needed with shoulder Ext.   objectIVE IMPAIRMENTS: decreased mobility, decreased ROM, decreased strength, increased edema, increased muscle spasms, impaired flexibility, impaired sensation, impaired UE functional use, postural dysfunction, and pain.    ACTIVITY LIMITATIONS: carrying, lifting, bending, sleeping, bathing, toileting, dressing, reach over head, and hygiene/grooming   PARTICIPATION LIMITATIONS: meal prep, cleaning, laundry, interpersonal relationship, driving, shopping, community activity, occupation, and yard work   PERSONAL FACTORS: Past/current experiences are also affecting patient's functional outcome.    REHAB POTENTIAL: Good   CLINICAL DECISION MAKING: Evolving/moderate complexity   EVALUATION COMPLEXITY: Moderate     GOALS: Goals reviewed with patient? Yes   SHORT TERM GOALS: Target date: 08/22/22   I with initial HEP Baseline: Goal status: 09/20/22 met   LONG TERM GOALS: Target date: 11/22/22   I with final HEP Baseline:  Goal status: met 08/30/22   2.  Increase R shoulder strength to at least 4/5 Baseline:  Goal status: RUE abd 4-/5, Flex 4/5, ER/IR 5/5  12/18/22 Progressing 12/25/22  3.   Increase R shoulder ROM to WNL, following appropriate protocol restrictions Baseline:  Goal status: Met 12/18/22    4.  Patient will be able to complete all of his required daily activities with R shoulder pain < 3/10, following all appropriate restrictions Baseline:  Goal status: Met 11/29/22 for AD, not farm stuff   5.  Patient will be able to lift 10# weight overhead with good control, in RUE, pain < 3/10 Baseline:  Goal status: INITIAL  11/29/22 on going  progressing 12/25/22  6.  Increase FOTO score to at least 59            Baseline: 25            Goal status: INITIAL PLAN:   PT FREQUENCY: 2x/week   PT DURATION: 12 weeks   PLANNED INTERVENTIONS: Therapeutic exercises, Therapeutic  activity, Neuromuscular re-education, Balance training, Gait training, Patient/Family education, Self Care, Joint mobilization, Dry Needling, Electrical stimulation, Cryotherapy, Moist heat, Taping, Vasopneumatic device, Ultrasound, Ionotophoresis 4mg /ml Dexamethasone, and Manual therapy   PLAN FOR NEXT SESSION: assess and progress        Grayce Sessions, PTA 01/01/2023, 1:48 PM  [facility] at [facility] [address] [facility]. [place], [place], [address] Phone: [phone]   Fax:  [phone]

## 2023-01-08 ENCOUNTER — Ambulatory Visit: Payer: BC Managed Care – PPO | Attending: Orthopaedic Surgery | Admitting: Physical Therapy

## 2023-01-08 DIAGNOSIS — M25612 Stiffness of left shoulder, not elsewhere classified: Secondary | ICD-10-CM | POA: Insufficient documentation

## 2023-01-08 DIAGNOSIS — M25611 Stiffness of right shoulder, not elsewhere classified: Secondary | ICD-10-CM

## 2023-01-08 DIAGNOSIS — M25511 Pain in right shoulder: Secondary | ICD-10-CM | POA: Diagnosis not present

## 2023-01-08 DIAGNOSIS — M6281 Muscle weakness (generalized): Secondary | ICD-10-CM | POA: Diagnosis not present

## 2023-01-08 DIAGNOSIS — R293 Abnormal posture: Secondary | ICD-10-CM | POA: Diagnosis not present

## 2023-01-08 NOTE — Therapy (Signed)
PT End of Session - 01/08/23 1400     Visit Number 22    Number of Visits 30    Date for PT Re-Evaluation 01/29/23    Authorization Type [employer]    PT Start Time 1400    PT Stop Time 1445    PT Time Calculation (min) 45 min                         OUTPATIENT PHYSICAL THERAPY SHOULDER TREATMENT     Patient Name: Bruce Cobb MRN: [id number] DOB:09/20/1965, 57 y.o., male Today's Date: 08/01/2022              Past Medical History:  Diagnosis Date   BCC (basal cell carcinoma of skin)     Hematospermia      hx of, remotely    Paroxysmal atrial fibrillation (HCC) 09/2017    saw cards   Pars defect of lumbar spine     PONV (postoperative nausea and vomiting)     Right knee DJD     Shingles 2014    face         Past Surgical History:  Procedure Laterality Date   ANKLE FRACTURE SURGERY Left 1996    plated   KNEE ARTHROSCOPY Left 1990    plica removed   KNEE ARTHROSCOPY Right      7 total   NASAL SEPTUM SURGERY       ORIF DISTAL RADIUS FRACTURE Right      age 23   REVISION TOTAL KNEE ARTHROPLASTY Right 08/19/2017   REVISION TOTAL SHOULDER TO REVERSE TOTAL SHOULDER Left 10/11/2021    Procedure: LEFT REVISION TOTAL SHOULDER TO REVERSE TOTAL SHOULDER, RIGHT SHOULDER STERIOD INJECTION;  Surgeon: Bjorn Pippin, MD;  Location: [facility];  Service: Orthopedics;  Laterality: Left;   SHOULDER ARTHROSCOPY W/ ROTATOR CUFF REPAIR Right 2011    bicep tendon and labrium repair   SHOULDER ARTHROSCOPY WITH ROTATOR CUFF REPAIR Left 2012    w/ labrium repair   TOTAL KNEE ARTHROPLASTY Right 10/12/2013    Procedure: RIGHT TOTAL KNEE ARTHROPLASTY;  Surgeon: Nilda Simmer, MD;  Location: [facility];  Service: Orthopedics;  Laterality: Right;   VASECTOMY            Patient Active Problem List    Diagnosis Date Noted   BCC (basal cell carcinoma of skin) 06/29/2019   Chronic leukopenia 09/24/2018   PCP NOTES >>>>>>>>>>>>>> 09/24/2018   Anxiety 09/24/2018   Insomnia 09/24/2018   History of  atrial fibrillation 09/24/2018   Irregular heart rhythm 02/24/2016   Dysfunction of both eustachian tubes 01/18/2016   DJD (degenerative joint disease) 10/12/2013   Pars defect of lumbar spine        PCP: N/A   REFERRING PROVIDER: Ramond Marrow, MD   REFERRING DIAG: R TSR   THERAPY DIAG:  Muscle weakness (generalized)   Abnormal posture   Acute pain of right shoulder   Stiffness of right shoulder, not elsewhere classified   Rationale for Evaluation and Treatment: Rehabilitation   ONSET DATE: 07/30/22   SUBJECTIVE:  SUBJECTIVE STATEMENT:  Shld feel the same whiich is not good. They both hurt and stiff   PERTINENT HISTORY: PMHx-LTSR 12/21, L TSR revision 10/11/21, TKE and revision, basal cell, PAF   PAIN:  Are you having pain? Yes: NPRS scale: 4/10 Pain location: R shoulder Pain description: discomfort Aggravating factors: moving at night Relieving factors: Not much helps currently.   PRECAUTIONS: Shoulder   WEIGHT BEARING RESTRICTIONS: No   FALLS:  Has patient fallen in last 6 months? No   LIVING ENVIRONMENT: Lives with: lives with their family Lives in: House/apartment Stairs: N/A     OCCUPATION: Real estate Therapist, sports, farming   PLOF: Independent   PATIENT GOALS:Recover his ability to resume his normal activities   NEXT MD VISIT:    OBJECTIVE:    DIAGNOSTIC FINDINGS:  N/A   PATIENT SURVEYS:  FOTO 25   COGNITION: Overall cognitive status: Within functional limits for tasks assessed                                  SENSATION: Not tested   POSTURE: Rounded shoulders   UPPER EXTREMITY ROM:    Passive ROM Right eval Left eval 08/30/22 RT shld supine 09/06/21 RT shld supine 09/11/22 RT shld  Active /Passive Supine 09/20/22 RT shld supine Act/Passive  10/09/22 Supine ACT/PASS 10/25/22 ACT/PASS standing 11/08/22  Act Standing 11/29/22 Act Standing 12/18/22 Act standing  Shoulder flexion 71   132 155 130/163 150/170 165/170 170/180 170 175 176  Shoulder extension               Shoulder abduction     92 102 90/110 115/133 125/150 140/150 158 165 162  Shoulder adduction               Shoulder internal rotation 51   65    Act 78 ACT 75     Shoulder external rotation 58   20 32 45/52 (45 degrees abd) 45/52(45 degrees abd) 50/60 ( at 45 degrees abd) 70/76 at 90 degrees abd     Elbow flexion               Elbow extension               Wrist flexion               Wrist extension               Wrist ulnar deviation               Wrist radial deviation               Wrist pronation               Wrist supination               (Blank rows = not tested)   UPPER EXTREMITY MMT: Strength testing of R shoulder deferred. Active movement in R elbow, wrist, hand/fingers     PALPATION:  Swelling noted throughout RUE from shoulder to hand. Ptient reports tenderness over incision.             TODAY'S TREATMENT:      01/08/23 UBE 3 min each way Seated Row Lat Pull 7# free wt ex Cable pulley ex Wt ball toss Push ups PROM RT shld and STW to teres AROM RT 5-10 degrees less than LEft MMT WFLs BIL   01/01/23 UBE L 4 3  min fwd/ 3 min backward 5# PNF 2 sets 10 5# OH press 10 x  8# 10 x 8lb shld flex 2x10 5# shld abd 10 x 8# 10 x Elevated push ups 3x10 Pulley 15# shld ext 2 sets 10 Pulley rows 20# 2 sets 10 PROM BIL shld, pain with ER on RT  12/25/22 UBE L 4 2 min fwd/ 2 min backward Pulley 15# shld ext 2 sets 10 Pulley rows 20# 2 sets 10 Incline pull up 2 sets 10 5# PNF 2 sets 10 5# OH press 10 x  8# 10 x 5# shld flex 10 x 8# 10 x 5# shld abd 10 x 8# 10 x PROM BIL shld, pain with ER on RT Rec ant shdl and pec stretching as 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[employer]   12/18/22 UBE L3.5 x 3 min each Ladder flex &  abductions stretch x 5each  Seated Rows 35lb & Lats 45lb 2x15 RUE blue ER/IR 2x10 Shoulder Ext 10lb 2x15 PROM Rt shoulder   12/04/22 UBE L3.5 x 3 min each Ladder flex & abductions stretch x 5each  Shoulder Flex & abd 2lb 2x10 each RUE green ER/IR 2x10 Seated Rows 35lb & Lats 45lb 2x15 PROM Rt shoulder   11/29/27 UBE L3 x 3 min each Ladder flex & abductions stretch x 7 each  Seated Rows 35lb & Lats 45lb 2x10 Shoulder Ext 10lb 2x10 ER red 2x10  PROM Rt shoulder    PATIENT EDUCATION: Education details: POC, initial HEP Person educated: Patient and Spouse Education method: Medical illustrator Education comprehension: verbalized understanding   HOME EXERCISE PROGRAM: [id number]   ASSESSMENT:   CLINICAL IMPRESSION-  .PROM BIL shld, pain with ER on RT 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 Progressing with strength and func. Postural cues needed .   objectIVE IMPAIRMENTS: decreased mobility, decreased ROM, decreased strength, increased edema, increased muscle spasms, impaired flexibility, impaired sensation, impaired UE functional use, postural dysfunction, and pain.    ACTIVITY LIMITATIONS: carrying, lifting, bending, sleeping, bathing, toileting, dressing, reach over head, and hygiene/grooming   PARTICIPATION LIMITATIONS: meal prep, cleaning, laundry, interpersonal relationship, driving, shopping, community activity, occupation, and yard work   PERSONAL FACTORS: Past/current experiences are also affecting patient's functional outcome.    REHAB POTENTIAL: Good   CLINICAL DECISION MAKING: Evolving/moderate complexity   EVALUATION COMPLEXITY: Moderate     GOALS: Goals reviewed with patient? Yes   SHORT TERM GOALS: Target date: 08/22/22   I with initial HEP Baseline: Goal status: 09/20/22 met   LONG TERM GOALS: Target date: 11/22/22   I with final HEP Baseline:  Goal status: met 08/30/22   2.  Increase R shoulder strength to at  least 4/5 Baseline:  Goal status: RUE abd 4-/5, Flex 4/5, ER/IR 5/5  12/18/22 Progressing 12/25/22  MET 01/08/23   3.  Increase R shoulder ROM to WNL, following appropriate protocol restrictions Baseline:  Goal status: Met 12/18/22 01/08/23 RT 5-10 degrees less than left    4.  Patient will be able to complete all of his required daily activities with R shoulder pain < 3/10, following all appropriate restrictions Baseline:  Goal status: Met 11/29/22 for AD, not farm stuff  01/08/23 not met but doing everything    5.  Patient will be able to lift 10# weight overhead with good control, in RUE, pain < 3/10 Baseline:  Goal status: INITIAL  11/29/22  on going  progressing 12/25/22  01/08/23 progressing  6.  Increase FOTO score to at least 59            Baseline: 25            Goal status: INITIAL PLAN:   PT FREQUENCY: 2x/week   PT DURATION: 12 weeks   PLANNED INTERVENTIONS: Therapeutic exercises, Therapeutic activity, Neuromuscular re-education, Balance training, Gait training, Patient/Family education, Self Care, Joint mobilization, Dry Needling, Electrical stimulation, Cryotherapy, Moist heat, Taping, Vasopneumatic device, Ultrasound, Ionotophoresis 4mg /ml Dexamethasone, and Manual therapy   PLAN FOR NEXT SESSION: assess and progress        Billi Bright,[name], PTA 01/08/2023, 2:02 PM  [facility] [facility] at [facility] [address] [facility]. [place], [place], [address] Phone: [phone]   Fax:  [phone] at [facility] [address] [address]. [place], [place], [address] Phone: [phone]   Fax:  [phone]  Patient Details  Name: KASHYAP NUTTLE MRN: [id number] Date of Birth: 09/21/1965 Referring Provider:  No ref. provider found  Encounter Date: 01/08/2023   Suanne Marker, PTA 01/08/2023, 2:02 PM   [facility] at [facility] [address] [facility]. [place], [place], [address] Phone:  [phone]   Fax:  [phone]

## 2023-01-15 ENCOUNTER — Ambulatory Visit: Payer: BC Managed Care – PPO | Admitting: Physical Therapy

## 2023-01-15 ENCOUNTER — Encounter: Payer: Self-pay | Admitting: Physical Therapy

## 2023-01-15 DIAGNOSIS — M25611 Stiffness of right shoulder, not elsewhere classified: Secondary | ICD-10-CM | POA: Diagnosis not present

## 2023-01-15 DIAGNOSIS — M6281 Muscle weakness (generalized): Secondary | ICD-10-CM

## 2023-01-15 DIAGNOSIS — R293 Abnormal posture: Secondary | ICD-10-CM | POA: Diagnosis not present

## 2023-01-15 DIAGNOSIS — M25612 Stiffness of left shoulder, not elsewhere classified: Secondary | ICD-10-CM | POA: Diagnosis not present

## 2023-01-15 DIAGNOSIS — M25511 Pain in right shoulder: Secondary | ICD-10-CM | POA: Diagnosis not present

## 2023-01-15 NOTE — Therapy (Signed)
PT End of Session - 01/15/23 1307     Visit Number 23    Date for PT Re-Evaluation 01/29/23    PT Start Time 1306    PT Stop Time 1345    PT Time Calculation (min) 39 min    Activity Tolerance Patient tolerated treatment well    Behavior During Therapy [facility] for tasks assessed/performed                         OUTPATIENT PHYSICAL THERAPY SHOULDER TREATMENT     Patient Name: Bruce Cobb MRN: [id number] DOB:[birth year], 57 y.o., male Today's Date: 08/01/2022              Past Medical History:  Diagnosis Date   BCC (basal cell carcinoma of skin)     Hematospermia      hx of, remotely    Paroxysmal atrial fibrillation (HCC) 09/2017    saw cards   Pars defect of lumbar spine     PONV (postoperative nausea and vomiting)     Right knee DJD     Shingles 2014    face         Past Surgical History:  Procedure Laterality Date   ANKLE FRACTURE SURGERY Left 1996    plated   KNEE ARTHROSCOPY Left 1990    plica removed   KNEE ARTHROSCOPY Right      7 total   NASAL SEPTUM SURGERY       ORIF DISTAL RADIUS FRACTURE Right      age 46   REVISION TOTAL KNEE ARTHROPLASTY Right 08/19/2017   REVISION TOTAL SHOULDER TO REVERSE TOTAL SHOULDER Left 10/11/2021    Procedure: LEFT REVISION TOTAL SHOULDER TO REVERSE TOTAL SHOULDER, RIGHT SHOULDER STERIOD INJECTION;  Surgeon: Bjorn Pippin, MD;  Location: [facility];  Service: Orthopedics;  Laterality: Left;   SHOULDER ARTHROSCOPY W/ ROTATOR CUFF REPAIR Right 2011    bicep tendon and labrium repair   SHOULDER ARTHROSCOPY WITH ROTATOR CUFF REPAIR Left 2012    w/ labrium repair   TOTAL KNEE ARTHROPLASTY Right 10/12/2013    Procedure: RIGHT TOTAL KNEE ARTHROPLASTY;  Surgeon: Nilda Simmer, MD;  Location: [facility];  Service: Orthopedics;  Laterality: Right;   VASECTOMY            Patient Active Problem List    Diagnosis Date Noted   BCC (basal cell carcinoma of skin) 06/29/2019   Chronic leukopenia 09/24/2018   PCP NOTES >>>>>>>>>>>>>>  09/24/2018   Anxiety 09/24/2018   Insomnia 09/24/2018   History of atrial fibrillation 09/24/2018   Irregular heart rhythm 02/24/2016   Dysfunction of both eustachian tubes 01/18/2016   DJD (degenerative joint disease) 10/12/2013   Pars defect of lumbar spine        PCP: N/A   REFERRING PROVIDER: Ramond Marrow, MD   REFERRING DIAG: R TSR   THERAPY DIAG:  Muscle weakness (generalized)   Abnormal posture   Acute pain of right shoulder   Stiffness of right shoulder, not elsewhere classified   Rationale for Evaluation and Treatment: Rehabilitation   ONSET DATE: 07/30/22   SUBJECTIVE:  SUBJECTIVE STATEMENT:  "Im sore" Had to move cinder blocks and clean out the animal shed     PERTINENT HISTORY: PMHx-LTSR 12/21, L TSR revision 10/11/21, TKE and revision, basal cell, PAF   PAIN:  Are you having pain? Yes: NPRS scale: 4/10 Pain location: R shoulder Pain description: discomfort Aggravating factors: moving at night Relieving factors: Not much helps currently.   PRECAUTIONS: Shoulder   WEIGHT BEARING RESTRICTIONS: No   FALLS:  Has patient fallen in last 6 months? No   LIVING ENVIRONMENT: Lives with: lives with their family Lives in: House/apartment Stairs: N/A     OCCUPATION: Real estate Therapist, sports, farming   PLOF: Independent   PATIENT GOALS:Recover his ability to resume his normal activities   NEXT MD VISIT:    OBJECTIVE:    DIAGNOSTIC FINDINGS:  N/A   PATIENT SURVEYS:  FOTO 25   COGNITION: Overall cognitive status: Within functional limits for tasks assessed                                  SENSATION: Not tested   POSTURE: Rounded shoulders   UPPER EXTREMITY ROM:    Passive ROM Right eval Left eval 08/30/22 RT shld supine 09/06/21 RT shld supine 09/11/22  RT shld  Active /Passive Supine 09/20/22 RT shld supine Act/Passive 10/09/22 Supine ACT/PASS 10/25/22 ACT/PASS standing 11/08/22  Act Standing 11/29/22 Act Standing 12/18/22 Act standing  Shoulder flexion 71   132 155 130/163 150/170 165/170 170/180 170 175 176  Shoulder extension               Shoulder abduction     92 102 90/110 115/133 125/150 140/150 158 165 162  Shoulder adduction               Shoulder internal rotation 51   65    Act 78 ACT 75     Shoulder external rotation 58   20 32 45/52 (45 degrees abd) 45/52(45 degrees abd) 50/60 ( at 45 degrees abd) 70/76 at 90 degrees abd     Elbow flexion               Elbow extension               Wrist flexion               Wrist extension               Wrist ulnar deviation               Wrist radial deviation               Wrist pronation               Wrist supination               (Blank rows = not tested)   UPPER EXTREMITY MMT: Strength testing of R shoulder deferred. Active movement in R elbow, wrist, hand/fingers     PALPATION:  Swelling noted throughout RUE from shoulder to hand. Ptient reports tenderness over incision.             TODAY'S TREATMENT:     01/15/23  UBE 3 min each way Seated Rows 45lb 2x12 Lat pulls 45lb 2x10 Elevated Push ups  Shoulder Flec 5lb 2x10  Shoulder abd 4lb 2x10 PROM RT shld and STW to teres  01/08/23 UBE 3 min each way Seated Row Lat  Pull 7# free wt ex Cable pulley ex Wt ball toss Push ups PROM RT shld and STW to teres AROM RT 5-10 degrees less than LEft MMT WFLs BIL   01/01/23 UBE L 4 3 min fwd/ 3 min backward 5# PNF 2 sets 10 5# OH press 10 x  8# 10 x 8lb shld flex 2x10 5# shld abd 10 x 8# 10 x Elevated push ups 3x10 Pulley 15# shld ext 2 sets 10 Pulley rows 20# 2 sets 10 PROM BIL shld, pain with ER on RT  12/25/22 UBE L 4 2 min fwd/ 2 min backward Pulley 15# shld ext 2 sets 10 Pulley rows 20# 2 sets 10 Incline pull up 2 sets 10 5# PNF 2 sets 10 5# OH press 10 x  8# 10  x 5# shld flex 10 x 8# 10 x 5# shld abd 10 x 8# 10 x PROM BIL shld, pain with ER on RT Rec ant shdl and pec stretching as 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[employer]   12/18/22 UBE L3.5 x 3 min each Ladder flex & abductions stretch x 5each  Seated Rows 35lb & Lats 45lb 2x15 RUE blue ER/IR 2x10 Shoulder Ext 10lb 2x15 PROM Rt shoulder   12/04/22 UBE L3.5 x 3 min each Ladder flex & abductions stretch x 5each  Shoulder Flex & abd 2lb 2x10 each RUE green ER/IR 2x10 Seated Rows 35lb & Lats 45lb 2x15 PROM Rt shoulder   11/29/27 UBE L3 x 3 min each Ladder flex & abductions stretch x 7 each  Seated Rows 35lb & Lats 45lb 2x10 Shoulder Ext 10lb 2x10 ER red 2x10  PROM Rt shoulder    PATIENT EDUCATION: Education details: POC, initial HEP Person educated: Patient and Spouse Education method: Medical illustrator Education comprehension: verbalized understanding   HOME EXERCISE PROGRAM: [id number]   ASSESSMENT:   CLINICAL IMPRESSION-  .PROM R shld, pain with ER.  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 Progressing with strength and func. Postural cues needed. Some posterior shoulder pain with push ups.   objectIVE IMPAIRMENTS: decreased mobility, decreased ROM, decreased strength, increased edema, increased muscle spasms, impaired flexibility, impaired sensation, impaired UE functional use, postural dysfunction, and pain.    ACTIVITY LIMITATIONS: carrying, lifting, bending, sleeping, bathing, toileting, dressing, reach over head, and hygiene/grooming   PARTICIPATION LIMITATIONS: meal prep, cleaning, laundry, interpersonal relationship, driving, shopping, community activity, occupation, and yard work   PERSONAL FACTORS: Past/current experiences are also affecting patient's functional outcome.    REHAB POTENTIAL: Good   CLINICAL DECISION MAKING: Evolving/moderate complexity   EVALUATION COMPLEXITY: Moderate      GOALS: Goals reviewed with patient? Yes   SHORT TERM GOALS: Target date: 08/22/22   I with initial HEP Baseline: Goal status: 09/20/22 met   LONG TERM GOALS: Target date: 11/22/22   I with final HEP Baseline:  Goal status: met 08/30/22   2.  Increase R shoulder strength to at least 4/5 Baseline:  Goal status: RUE abd 4-/5, Flex 4/5, ER/IR 5/5  12/18/22 Progressing 12/25/22  MET 01/08/23   3.  Increase R shoulder ROM to WNL, following appropriate protocol restrictions Baseline:  Goal status: Met 12/18/22 01/08/23 RT 5-10 degrees less than left    4.  Patient will be able to complete all of his required daily activities with R shoulder pain < 3/10, following all appropriate restrictions Baseline:  Goal status: Met  11/29/22 for AD, not farm stuff  01/08/23 not met but doing everything    5.  Patient will be able to lift 10# weight overhead with good control, in RUE, pain < 3/10 Baseline:  Goal status: INITIAL  11/29/22 on going  progressing 12/25/22  01/08/23 progressing  6.  Increase FOTO score to at least 59            Baseline: 25            Goal status: INITIAL PLAN:   PT FREQUENCY: 2x/week   PT DURATION: 12 weeks   PLANNED INTERVENTIONS: Therapeutic exercises, Therapeutic activity, Neuromuscular re-education, Balance training, Gait training, Patient/Family education, Self Care, Joint mobilization, Dry Needling, Electrical stimulation, Cryotherapy, Moist heat, Taping, Vasopneumatic device, Ultrasound, Ionotophoresis 4mg /ml Dexamethasone, and Manual therapy   PLAN FOR NEXT SESSION: assess and progress        Grayce Sessions, PTA 01/15/2023, 1:08 PM  [facility] at [facility] [address] [place], [place], [address] Phone: [phone]   Fax:  [phone] at [facility] [address] [place], [place], [address] Phone: [phone]   Fax:  [phone]  Patient Details   Name: Bruce Cobb MRN: [id number] Date of Birth: 1966/08/21 Referring Provider:  No ref. provider found  Encounter Date: 01/15/2023   Grayce Sessions, PTA 01/15/2023, 1:08 PM  [facility] at [facility] [address] [place], [place], [address] Phone: [phone]   Fax:  [phone]

## 2023-01-21 ENCOUNTER — Encounter
Payer: BC Managed Care – PPO | Attending: Physical Medicine and Rehabilitation | Admitting: Physical Medicine and Rehabilitation

## 2023-01-21 ENCOUNTER — Encounter: Payer: Self-pay | Admitting: Physical Medicine and Rehabilitation

## 2023-01-21 VITALS — Ht 72.0 in | Wt 173.0 lb

## 2023-01-21 DIAGNOSIS — G8929 Other chronic pain: Secondary | ICD-10-CM | POA: Diagnosis not present

## 2023-01-21 DIAGNOSIS — G894 Chronic pain syndrome: Secondary | ICD-10-CM

## 2023-01-21 DIAGNOSIS — M25561 Pain in right knee: Secondary | ICD-10-CM | POA: Insufficient documentation

## 2023-01-21 DIAGNOSIS — Z029 Encounter for administrative examinations, unspecified: Secondary | ICD-10-CM | POA: Diagnosis not present

## 2023-01-21 DIAGNOSIS — M25511 Pain in right shoulder: Secondary | ICD-10-CM | POA: Insufficient documentation

## 2023-01-21 DIAGNOSIS — M25512 Pain in left shoulder: Secondary | ICD-10-CM | POA: Diagnosis not present

## 2023-01-21 DIAGNOSIS — Z5181 Encounter for therapeutic drug level monitoring: Secondary | ICD-10-CM | POA: Insufficient documentation

## 2023-01-21 DIAGNOSIS — M436 Torticollis: Secondary | ICD-10-CM | POA: Insufficient documentation

## 2023-01-21 DIAGNOSIS — M7918 Myalgia, other site: Secondary | ICD-10-CM | POA: Diagnosis not present

## 2023-01-21 MED ORDER — HYDROCODONE-ACETAMINOPHEN 7.5-325 MG PO TABS: 1.0000 | ORAL_TABLET | Freq: Three times a day (TID) | ORAL | 0 refills | Status: DC | PRN

## 2023-01-21 MED ORDER — OXYCODONE-ACETAMINOPHEN 7.5-325 MG PO TABS: 1.0000 | ORAL_TABLET | Freq: Three times a day (TID) | ORAL | 0 refills | Status: AC | PRN

## 2023-01-21 NOTE — Progress Notes (Addendum)
Subjective:    Patient ID: Bruce Cobb, male    DOB: Oct 09, 1965, 57 y.o.   MRN: [id number]  HPI  HPI  Bruce Cobb is a 57 y.o. year old male  who  has a past medical history of BCC (basal cell carcinoma of skin), Hematospermia, Paroxysmal atrial fibrillation (HCC) (09/2017), Pars defect of lumbar spine, PONV (postoperative nausea and vomiting), Right knee DJD, and Shingles (2014).   They are presenting to PM&R clinic as a new patient for pain management evaluation. They were referred by  for treatment of R knee and bilateral shoulder  pain.   Source: R knee Inciting incident:  R knee - none L shoulder - had a massage where he thinks the rotator cuff got torn  Description of pain: Aching, stabbing, constant; initially they thought R knee was nerve pain but that workup has been thoroughly negative.  Exacerbating factors: standing and activity Remitting factors: nothing Red flag symptoms: No red flags for back pain endorsed in Hx or ROS  Medications tried: Topical medications (no effect) : Tried voltaren, biofreeze, and lidocaine patches Nsaids (no effect) : "Meloxicam and tramadol destroyed my stomach". Tried celebrex as well; did not help for his knee.  Tylenol  (no effect) :  Opiates  (moderate effect) : "Meloxicam and tramadol destroyed my stomach"  Currently onPercocet 7.5 mg TID PRN; takes 2-3x per day; last filled 4/15. Does help with sleep; last for about 4 hours. Takes the edge off, but doesn't relieve pain.   Gabapentin / Lyrica  (unsure of effect) : Tried gabapentin with Dr. Bertram Gala years ago, did not help.   TCAs  (never tried) :  SNRIs  (unsure of effect) : Cymbalta; have'nt had since his shoulders Other  (excellent effect) : Got a steroid shot shortly before going on vacation to [place]; lasted about 2 months  Other treatments: PT/OT  (moderate effect) : In PT every other week, should finish in June. Working mostly on R shoulder, also with L shoulder. "It definitely  helps; I'm the kind of person who loves weight lifting and range of motion".   Accupuncture/chiropractor/massage  (no effect) : Tried massage on L shoulder  TENs unit (mild effect) :  Injections (unsure of effect) : none since his surgeries  Had trigger point injections in his R neck, which helped with ROM of his neck.   Surgery (no effect) : 11 surgeries on L knee, including replacement and revision.  L shoulder has been replaced twice. R shoulder was replaced before christmas.   Other  (never tried) : Ice and heat do not help.   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 He goes to [facility] pain clinic as well;   Prior UDS results: No results found for: "LABOPIA", "COCAINSCRNUR", "LABBENZ", "AMPHETMU", "THCU", "LABBARB"    Pain Inventory Average Pain 7 Pain Right Now 6 My pain is constant, burning, dull, and aching  In the last 24 hours, has pain interfered with the following? General activity 10 Relation with others 10 Enjoyment of life 10 What TIME of day is your pain at its worst? morning  and daytime Sleep (in general) Poor  Pain is worse with: standing and some activites Pain improves with:  nothing Relief from Meds: 5  ability to climb steps?  yes do you drive?  yes  employed #  of hrs/week Consultant Farm Do you have any goals in this area?  yes  suicidal thoughts  x-rays 11/2022 of Right Shoulder by Dr. Everardo Pacific  Any changes since last visit?  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   Wife has a history of breast cancer, recurrent, status post bilateral mastectomy   Wife also had thyroid cancer.   The patient has been very athletic all his life, playing basketball, softball, tennis etc.  As of 09/2018 is not very active due to multiple MSK issues.   Social Determinants of Health   Financial Resource Strain: Not on file  Food Insecurity: Not on file  Transportation Needs: Not on file  Physical Activity: Not on file  Stress: Not on file  Social Connections: Not on file   Past Surgical History:  Procedure Laterality Date   ANKLE FRACTURE SURGERY Left 1996   plated   KNEE ARTHROSCOPY Left 1990   plica removed   KNEE ARTHROSCOPY Right    7 total   NASAL SEPTUM SURGERY     ORIF DISTAL RADIUS FRACTURE Right    age 62   REVISION TOTAL KNEE ARTHROPLASTY Right 08/19/2017   REVISION TOTAL SHOULDER TO REVERSE TOTAL SHOULDER Left 10/11/2021   Procedure: LEFT REVISION TOTAL SHOULDER TO REVERSE TOTAL SHOULDER, RIGHT SHOULDER STERIOD INJECTION;  Surgeon: Bjorn Pippin, MD;  Location: [facility];  Service: Orthopedics;  Laterality: Left;   SHOULDER ARTHROSCOPY W/ ROTATOR CUFF REPAIR Right 2011   bicep tendon and labrium repair   SHOULDER ARTHROSCOPY WITH ROTATOR CUFF REPAIR Left 2012   w/ labrium repair   TOTAL KNEE ARTHROPLASTY Right 10/12/2013   Procedure: RIGHT TOTAL KNEE ARTHROPLASTY;  Surgeon: Nilda Simmer, MD;  Location: [facility];  Service: Orthopedics;  Laterality: Right;   VASECTOMY     Past Medical History:   Diagnosis Date   BCC (basal cell carcinoma of skin)    Hematospermia    hx of, remotely    Paroxysmal atrial fibrillation (HCC) 09/2017   saw cards   Pars defect of lumbar spine    PONV (postoperative nausea and vomiting)    Right knee DJD    Shingles 2014   face   Ht 6' (1.829 m)   Wt 173 lb (78.5 kg)   BMI 23.46 kg/m   Opioid Risk Score:   Fall Risk Score:  `1  Depression screen [facility] 2/9     01/21/2023    3:08 PM 09/24/2018   12:21 PM  Depression screen PHQ 2/9  Decreased Interest 2 0  Down, Depressed, Hopeless 3 0  PHQ - 2 Score 5 0  Altered sleeping 3 1  Tired, decreased energy 1 0  Change in appetite 2 0  Feeling bad or failure about yourself  0 0  Trouble concentrating 0 0  Moving slowly or fidgety/restless 0 0  Suicidal thoughts 3 0  PHQ-9 Score 14 1    Review of Systems  Musculoskeletal:  Positive for neck stiffness.       Pain in both shoulders, Right knee Pain,   All other systems reviewed and are negative.      Objective:   Physical Exam  PE: Constitution: Appropriate appearance for age. No apparent distress   Resp: No respiratory distress. No accessory muscle usage. on RA and CTAB Cardio: Well perfused appearance. No peripheral edema. Abdomen: Nondistended. Nontender.   Psych: Appropriate mood and affect. Neuro: AAOx4. No apparent cognitive deficits   Neurologic Exam:   Sensory exam: revealed normal sensation in all dermatomal regions in bilateral upper extremities and bilateral lower extremities Motor exam: strength 5/5 throughout bilateral upper extremities, bilateral lower extremities, and with exception of bilateral shoulder abduction 4/5 Coordination: Fine motor coordination was normal.   Gait: normal  Neck: + TTP lateral to vertebra prominans b/l; traps; and supraspinatus muscles AROM in extension, bilateral rotation, and L>R sidebending extremely limited   R knee: Anterior scar s/p replacement and revision. TTP over lateral  popliteal fossa; otherwise no tenderness, instability, or effusion noted. AROM in knee flexion and extension WNL, however somewhat stiff/gaurding with full extension.   Shoulders: Bilateral shoulders TTP over AC joint and inferior/mid scapula LUE: +Hawkin's, empty can, and apprehension tests. Internal rotation extremely limited; otherwise WNL AROM.  RUE: +hawkins and mild empty can test. WNL AROM.     Assessment & Plan:   Bruce Cobb is a 57 y.o. year old male  who  has a past medical history of BCC (basal cell carcinoma of skin), Hematospermia, Paroxysmal atrial fibrillation (HCC) (09/2017), Pars defect of lumbar spine, PONV (postoperative nausea and vomiting), Right knee DJD, and Shingles (2014).   They are presenting to PM&R clinic as a new patient for treatment of chronic bilateral shoulder and R knee pain.   Chronic pain syndrome Chronic pain of both shoulders Myofascial pain Today, you signed a pain contract with our clinic and performed a urine drug screen.  I will refill your Percocet 7.5 mg 3 times daily as needed for the next 3 months.  You will hear from [place] if there is any issue with your urine drug screen.  Please continue to abstain from alcohol use while taking your medications.  I will have you follow-up with me in the next 1 to 2 weeks for trigger point injections of the neck and shoulders  Use Voltaren gel over-the-counter twice daily for the next few weeks to help with adjunctive pain control  Occasional steroid Dosepaks once or twice a year for severe pain may be helpful for you; please message me through MyChart or call our office if you would need this.  Follow up with me in 3 months  Neck stiffness Continue physical therapy for your shoulders; ask them to help you with home neck stretches as part of your discharge program  Posterior knee pain, right We can readdress this at subsequent visits; I do  not think that genicular nerve blocks would benefit you at this time,  and hamstring tightness treatments/popliteus injections have already not been helpful

## 2023-01-21 NOTE — Patient Instructions (Signed)
Chronic pain syndrome Chronic pain of both shoulders Myofascial pain Today, you signed a pain contract with our clinic and performed a urine drug screen.  I will refill your Norco 7.5 mg 3 times daily as needed for the next 3 months.  You will hear from [place] if there is any issue with your urine drug screen.  Please continue to abstain from alcohol use while taking your medications.  I will have you follow-up with me in the next 1 to 2 weeks for trigger point injections of the neck and shoulders  Use Voltaren gel over-the-counter twice daily for the next few weeks to help with adjunctive pain control  Occasional steroid Dosepaks once or twice a year for severe pain may be helpful for you; please message me through MyChart or call our office if you would need this.  Follow up with me in 3 months  Neck stiffness Continue physical therapy for your shoulders; asked them to help you with home neck stretches as part of your discharge program  Posterior knee pain, right We can readdress this at subsequent visits; I do not think that genicular nerve blocks would benefit you at this time, and hamstring tightness treatments/popliteus injections have already not been helpful

## 2023-01-22 ENCOUNTER — Ambulatory Visit: Payer: BC Managed Care – PPO | Admitting: Physical Therapy

## 2023-01-22 DIAGNOSIS — M25612 Stiffness of left shoulder, not elsewhere classified: Secondary | ICD-10-CM | POA: Diagnosis not present

## 2023-01-22 DIAGNOSIS — M6281 Muscle weakness (generalized): Secondary | ICD-10-CM

## 2023-01-22 DIAGNOSIS — M25611 Stiffness of right shoulder, not elsewhere classified: Secondary | ICD-10-CM

## 2023-01-22 DIAGNOSIS — R293 Abnormal posture: Secondary | ICD-10-CM | POA: Diagnosis not present

## 2023-01-22 DIAGNOSIS — M25511 Pain in right shoulder: Secondary | ICD-10-CM | POA: Diagnosis not present

## 2023-01-22 NOTE — Therapy (Signed)
PT End of Session - 01/22/23 1313     Visit Number 24    Number of Visits 30    Date for PT Re-Evaluation 01/29/23    Authorization Type BCBS    Authorization Time Period 02/27/22 - 03/29/22    PT Start Time 1310    PT Stop Time 1400    PT Time Calculation (min) 50 min                         OUTPATIENT PHYSICAL THERAPY SHOULDER TREATMENT     Patient Name: Bruce Cobb MRN: [id number] DOB:10-07-1965, 57 y.o., male Today's Date: 08/01/2022              Past Medical History:  Diagnosis Date   BCC (basal cell carcinoma of skin)     Hematospermia      hx of, remotely    Paroxysmal atrial fibrillation (HCC) 09/2017    saw cards   Pars defect of lumbar spine     PONV (postoperative nausea and vomiting)     Right knee DJD     Shingles 2014    face         Past Surgical History:  Procedure Laterality Date   ANKLE FRACTURE SURGERY Left 1996    plated   KNEE ARTHROSCOPY Left 1990    plica removed   KNEE ARTHROSCOPY Right      7 total   NASAL SEPTUM SURGERY       ORIF DISTAL RADIUS FRACTURE Right      age 48   REVISION TOTAL KNEE ARTHROPLASTY Right 08/19/2017   REVISION TOTAL SHOULDER TO REVERSE TOTAL SHOULDER Left 10/11/2021    Procedure: LEFT REVISION TOTAL SHOULDER TO REVERSE TOTAL SHOULDER, RIGHT SHOULDER STERIOD INJECTION;  Surgeon: Bjorn Pippin, MD;  Location: [facility];  Service: Orthopedics;  Laterality: Left;   SHOULDER ARTHROSCOPY W/ ROTATOR CUFF REPAIR Right 2011    bicep tendon and labrium repair   SHOULDER ARTHROSCOPY WITH ROTATOR CUFF REPAIR Left 2012    w/ labrium repair   TOTAL KNEE ARTHROPLASTY Right 10/12/2013    Procedure: RIGHT TOTAL KNEE ARTHROPLASTY;  Surgeon: Nilda Simmer, MD;  Location: [facility];  Service: Orthopedics;  Laterality: Right;   VASECTOMY            Patient Active Problem List    Diagnosis Date Noted   BCC (basal cell carcinoma of skin) 06/29/2019   Chronic leukopenia 09/24/2018   PCP NOTES >>>>>>>>>>>>>> 09/24/2018   Anxiety  09/24/2018   Insomnia 09/24/2018   History of atrial fibrillation 09/24/2018   Irregular heart rhythm 02/24/2016   Dysfunction of both eustachian tubes 01/18/2016   DJD (degenerative joint disease) 10/12/2013   Pars defect of lumbar spine        PCP: N/A   REFERRING PROVIDER: Ramond Marrow, MD   REFERRING DIAG: R TSR   THERAPY DIAG:  Muscle weakness (generalized)   Abnormal posture   Acute pain of right shoulder   Stiffness of right shoulder, not elsewhere classified   Rationale for Evaluation and Treatment: Rehabilitation   ONSET DATE: 07/30/22   SUBJECTIVE:  SUBJECTIVE STATEMENT:  Really have not done much and shlds are just awful. Saw pain mang yesterday   PERTINENT HISTORY: PMHx-LTSR 12/21, L TSR revision 10/11/21, TKE and revision, basal cell, PAF   PAIN:  Are you having pain? Yes: NPRS scale: 4/10 Pain location: R shoulder Pain description: discomfort Aggravating factors: moving at night Relieving factors: Not much helps currently.   PRECAUTIONS: Shoulder   WEIGHT BEARING RESTRICTIONS: No   FALLS:  Has patient fallen in last 6 months? No   LIVING ENVIRONMENT: Lives with: lives with their family Lives in: House/apartment Stairs: N/A     OCCUPATION: Real estate Therapist, sports, farming   PLOF: Independent   PATIENT GOALS:Recover his ability to resume his normal activities   NEXT MD VISIT:    OBJECTIVE:    DIAGNOSTIC FINDINGS:  N/A   PATIENT SURVEYS:  FOTO 25   COGNITION: Overall cognitive status: Within functional limits for tasks assessed                                  SENSATION: Not tested   POSTURE: Rounded shoulders   UPPER EXTREMITY ROM:    Passive ROM Right eval Left eval 08/30/22 RT shld supine 09/06/21 RT shld supine 09/11/22 RT shld   Active /Passive Supine 09/20/22 RT shld supine Act/Passive 10/09/22 Supine ACT/PASS 10/25/22 ACT/PASS standing 11/08/22  Act Standing 11/29/22 Act Standing 12/18/22 Act standing  Shoulder flexion 71   132 155 130/163 150/170 165/170 170/180 170 175 176  Shoulder extension               Shoulder abduction     92 102 90/110 115/133 125/150 140/150 158 165 162  Shoulder adduction               Shoulder internal rotation 51   65    Act 78 ACT 75     Shoulder external rotation 58   20 32 45/52 (45 degrees abd) 45/52(45 degrees abd) 50/60 ( at 45 degrees abd) 70/76 at 90 degrees abd     Elbow flexion               Elbow extension               Wrist flexion               Wrist extension               Wrist ulnar deviation               Wrist radial deviation               Wrist pronation               Wrist supination               (Blank rows = not tested)   UPPER EXTREMITY MMT: Strength testing of R shoulder deferred. Active movement in R elbow, wrist, hand/fingers     PALPATION:  Swelling noted throughout RUE from shoulder to hand. Ptient reports tenderness over incision.             TODAY'S TREATMENT:     01/23/23 UBE L 5 3 min each way DN BIL bicep, terres, cerv and UT PROM and jt capsule stretching BIL Overhead wt ball func strengthening- BIL OH flex,Single UE flex, single arm abd Wt ball toss/throw Cable pulleys     01/15/23  UBE  3 min each way Seated Rows 45lb 2x12 Lat pulls 45lb 2x10 Elevated Push ups  Shoulder Flec 5lb 2x10  Shoulder abd 4lb 2x10 PROM RT shld and STW to teres  01/08/23 UBE 3 min each way Seated Row Lat Pull 7# free wt ex Cable pulley ex Wt ball toss Push ups PROM RT shld and STW to teres AROM RT 5-10 degrees less than LEft MMT WFLs BIL   01/01/23 UBE L 4 3 min fwd/ 3 min backward 5# PNF 2 sets 10 5# OH press 10 x  8# 10 x 8lb shld flex 2x10 5# shld abd 10 x 8# 10 x Elevated push ups 3x10 Pulley 15# shld ext 2 sets 10 Pulley rows 20# 2  sets 10 PROM BIL shld, pain with ER on RT  12/25/22 UBE L 4 2 min fwd/ 2 min backward Pulley 15# shld ext 2 sets 10 Pulley rows 20# 2 sets 10 Incline pull up 2 sets 10 5# PNF 2 sets 10 5# OH press 10 x  8# 10 x 5# shld flex 10 x 8# 10 x 5# shld abd 10 x 8# 10 x PROM BIL shld, pain with ER on RT Rec ant shdl and pec stretching as 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[employer]   12/18/22 UBE L3.5 x 3 min each Ladder flex & abductions stretch x 5each  Seated Rows 35lb & Lats 45lb 2x15 RUE blue ER/IR 2x10 Shoulder Ext 10lb 2x15 PROM Rt shoulder   12/04/22 UBE L3.5 x 3 min each Ladder flex & abductions stretch x 5each  Shoulder Flex & abd 2lb 2x10 each RUE green ER/IR 2x10 Seated Rows 35lb & Lats 45lb 2x15 PROM Rt shoulder   11/29/27 UBE L3 x 3 min each Ladder flex & abductions stretch x 7 each  Seated Rows 35lb & Lats 45lb 2x10 Shoulder Ext 10lb 2x10 ER red 2x10  PROM Rt shoulder    PATIENT EDUCATION: Education details: POC, initial HEP Person educated: Patient and Spouse Education method: Medical illustrator Education comprehension: verbalized understanding   HOME EXERCISE PROGRAM: [id number]   ASSESSMENT:   CLINICAL IMPRESSION-  .worked func OH strengthening with wt ball. Worked on throwing with many compenstaions. PROM and DN to help with pain objectIVE IMPAIRMENTS: decreased mobility, decreased ROM, decreased strength, increased edema, increased muscle spasms, impaired flexibility, impaired sensation, impaired UE functional use, postural dysfunction, and pain.    ACTIVITY LIMITATIONS: carrying, lifting, bending, sleeping, bathing, toileting, dressing, reach over head, and hygiene/grooming   PARTICIPATION LIMITATIONS: meal prep, cleaning, laundry, interpersonal relationship, driving, shopping, community activity, occupation, and yard work   PERSONAL FACTORS: Past/current experiences are also affecting patient's functional outcome.     REHAB POTENTIAL: Good   CLINICAL DECISION MAKING: Evolving/moderate complexity   EVALUATION COMPLEXITY: Moderate     GOALS: Goals reviewed with patient? Yes   SHORT TERM GOALS: Target date: 08/22/22   I with initial HEP Baseline: Goal status: 09/20/22 met   LONG TERM GOALS: Target date: 11/22/22   I with final HEP Baseline:  Goal status: met 08/30/22   2.  Increase R shoulder strength to at least 4/5 Baseline:  Goal status: RUE abd 4-/5, Flex 4/5, ER/IR 5/5  12/18/22 Progressing 12/25/22  MET 01/08/23   3.  Increase R shoulder ROM to WNL, following appropriate protocol restrictions Baseline:  Goal status: Met 12/18/22 01/08/23 RT 5-10 degrees less than left    4.  Patient will be able  to complete all of his required daily activities with R shoulder pain < 3/10, following all appropriate restrictions Baseline:  Goal status: Met 11/29/22 for AD, not farm stuff  01/08/23 not met but doing everything    5.  Patient will be able to lift 10# weight overhead with good control, in RUE, pain < 3/10 Baseline:  Goal status: INITIAL  11/29/22 on going  progressing 12/25/22  01/08/23 progressing  6.  Increase FOTO score to at least 59            Baseline: 25            Goal status: INITIAL PLAN:   PT FREQUENCY: 2x/week   PT DURATION: 12 weeks   PLANNED INTERVENTIONS: Therapeutic exercises, Therapeutic activity, Neuromuscular re-education, Balance training, Gait training, Patient/Family education, Self Care, Joint mobilization, Dry Needling, Electrical stimulation, Cryotherapy, Moist heat, Taping, Vasopneumatic device, Ultrasound, Ionotophoresis 4mg /ml Dexamethasone, and Manual therapy   PLAN FOR NEXT SESSION: assess and progress        Dvon Jiles,[name], PTA 01/22/2023, 1:14 PM  [facility] [facility] Out[facility] Rehabilitation at [facility] [address] [place], [place], [address] Phone: [phone]   Fax:  [phone] at [facility] [address]. [place], [place], [address] Phone: [phone]   Fax:  [phone]  Patient Details  Name: Bruce Cobb MRN: [id number] Date of Birth: May 03, 1966 Referring Provider:  No ref. provider found  Encounter Date: 01/22/2023   Nicola Girt 01/22/2023, 1:14 PM  [facility] Outpatient Rehabilitation at [facility] [address] [facility]. [place], [place], [address] Phone: [phone]   Fax:  [phone] at [facility] [address] [place], [place], [address] Phone: [phone]   Fax:  [phone]  Patient Details  Name: Bruce Cobb MRN: [id number] Date of Birth: 04/20/1966 Referring Provider:  No ref. provider found  Encounter Date: 01/22/2023   Nicola Girt 01/22/2023, 1:14 PM  [facility] Health Outpatient Rehabilitation at [facility] [address] [facility] [place], [place], [address] Phone: [phone]   Fax:  [phone]

## 2023-01-25 LAB — DRUG TOX MONITOR 1 W/CONF, ORAL FLD
Amphetamines: NEGATIVE ng/mL (ref <10)
Barbiturates: NEGATIVE ng/mL (ref <10)
Benzodiazepines: NEGATIVE ng/mL (ref <0.50)
Buprenorphine: NEGATIVE ng/mL (ref <0.10)
Cocaine: NEGATIVE ng/mL (ref <5.0)
Codeine: NEGATIVE ng/mL (ref <2.5)
Dihydrocodeine: NEGATIVE ng/mL (ref <2.5)
Fentanyl: NEGATIVE ng/mL (ref <0.10)
Heroin Metabolite: NEGATIVE ng/mL (ref <1.0)
Hydrocodone: NEGATIVE ng/mL (ref <2.5)
Hydromorphone: NEGATIVE ng/mL (ref <2.5)
MARIJUANA: NEGATIVE ng/mL (ref <2.5)
MDMA: NEGATIVE ng/mL (ref <10)
Meprobamate: NEGATIVE ng/mL (ref <2.5)
Methadone: NEGATIVE ng/mL (ref <5.0)
Morphine: NEGATIVE ng/mL (ref <2.5)
Nicotine Metabolite: NEGATIVE ng/mL (ref <5.0)
Norhydrocodone: NEGATIVE ng/mL (ref <2.5)
Noroxycodone: 30.4 ng/mL — ABNORMAL HIGH (ref <2.5)
Opiates: POSITIVE ng/mL — AB (ref <2.5)
Oxycodone: 173.8 ng/mL — ABNORMAL HIGH (ref <2.5)
Oxymorphone: NEGATIVE ng/mL (ref <2.5)
Phencyclidine: NEGATIVE ng/mL (ref <10)
Tapentadol: NEGATIVE ng/mL (ref <5.0)
Tramadol: NEGATIVE ng/mL (ref <5.0)
Zolpidem: NEGATIVE ng/mL (ref <5.0)

## 2023-01-25 LAB — DRUG TOX ALC METAB W/CON, ORAL FLD
Alcohol Metabolite: POSITIVE ng/mL — AB (ref <25)
Ethyl Sulfate: 106 ng/mL — ABNORMAL HIGH (ref <25)

## 2023-02-05 ENCOUNTER — Ambulatory Visit: Payer: BC Managed Care – PPO | Attending: Orthopaedic Surgery | Admitting: Physical Therapy

## 2023-02-05 DIAGNOSIS — M6281 Muscle weakness (generalized): Secondary | ICD-10-CM | POA: Diagnosis not present

## 2023-02-05 DIAGNOSIS — M25612 Stiffness of left shoulder, not elsewhere classified: Secondary | ICD-10-CM | POA: Diagnosis not present

## 2023-02-05 DIAGNOSIS — M25611 Stiffness of right shoulder, not elsewhere classified: Secondary | ICD-10-CM | POA: Insufficient documentation

## 2023-02-05 NOTE — Therapy (Signed)
PT End of Session - 02/05/23 1442     Visit Number 25    Number of Visits 30    Authorization Type BCBS    Authorization Time Period 02/27/22 - 03/29/22    PT Start Time 1410    PT Stop Time 1450    PT Time Calculation (min) 40 min                         OUTPATIENT PHYSICAL THERAPY SHOULDER TREATMENT     Patient Name: Bruce Cobb MRN: [id number] DOB:[birth year], 57 y.o., male Today's Date: 08/01/2022              Past Medical History:  Diagnosis Date   BCC (basal cell carcinoma of skin)     Hematospermia      hx of, remotely    Paroxysmal atrial fibrillation (HCC) 09/2017    saw cards   Pars defect of lumbar spine     PONV (postoperative nausea and vomiting)     Right knee DJD     Shingles 2014    face         Past Surgical History:  Procedure Laterality Date   ANKLE FRACTURE SURGERY Left 1996    plated   KNEE ARTHROSCOPY Left 1990    plica removed   KNEE ARTHROSCOPY Right      7 total   NASAL SEPTUM SURGERY       ORIF DISTAL RADIUS FRACTURE Right      age 1   REVISION TOTAL KNEE ARTHROPLASTY Right 08/19/2017   REVISION TOTAL SHOULDER TO REVERSE TOTAL SHOULDER Left 10/11/2021    Procedure: LEFT REVISION TOTAL SHOULDER TO REVERSE TOTAL SHOULDER, RIGHT SHOULDER STERIOD INJECTION;  Surgeon: Bjorn Pippin, MD;  Location: [facility];  Service: Orthopedics;  Laterality: Left;   SHOULDER ARTHROSCOPY W/ ROTATOR CUFF REPAIR Right 2011    bicep tendon and labrium repair   SHOULDER ARTHROSCOPY WITH ROTATOR CUFF REPAIR Left 2012    w/ labrium repair   TOTAL KNEE ARTHROPLASTY Right 10/12/2013    Procedure: RIGHT TOTAL KNEE ARTHROPLASTY;  Surgeon: Nilda Simmer, MD;  Location: [facility];  Service: Orthopedics;  Laterality: Right;   VASECTOMY            Patient Active Problem List    Diagnosis Date Noted   BCC (basal cell carcinoma of skin) 06/29/2019   Chronic leukopenia 09/24/2018   PCP NOTES >>>>>>>>>>>>>> 09/24/2018   Anxiety 09/24/2018   Insomnia 09/24/2018    History of atrial fibrillation 09/24/2018   Irregular heart rhythm 02/24/2016   Dysfunction of both eustachian tubes 01/18/2016   DJD (degenerative joint disease) 10/12/2013   Pars defect of lumbar spine        PCP: N/A   REFERRING PROVIDER: Ramond Marrow, MD   REFERRING DIAG: R TSR   THERAPY DIAG:  Muscle weakness (generalized)   Abnormal posture   Acute pain of right shoulder   Stiffness of right shoulder, not elsewhere classified   Rationale for Evaluation and Treatment: Rehabilitation   ONSET DATE: 07/30/22   SUBJECTIVE:  SUBJECTIVE STATEMENT: 700 bales of hay 2 days ago sore but okay- alos on low dose prednisone. Seeing pain mnag for some sort of injection in mid June   PERTINENT HISTORY: PMHx-LTSR 12/21, L TSR revision 10/11/21, TKE and revision, basal cell, PAF   PAIN:  Are you having pain? Yes: NPRS scale: 4/10 Pain location: R shoulder Pain description: discomfort Aggravating factors: moving at night Relieving factors: Not much helps currently.   PRECAUTIONS: Shoulder   WEIGHT BEARING RESTRICTIONS: No   FALLS:  Has patient fallen in last 6 months? No   LIVING ENVIRONMENT: Lives with: lives with their family Lives in: House/apartment Stairs: N/A     OCCUPATION: Real estate Therapist, sports, farming   PLOF: Independent   PATIENT GOALS:Recover his ability to resume his normal activities   NEXT MD VISIT:    OBJECTIVE:    DIAGNOSTIC FINDINGS:  N/A   PATIENT SURVEYS:  FOTO 25   COGNITION: Overall cognitive status: Within functional limits for tasks assessed                                  SENSATION: Not tested   POSTURE: Rounded shoulders   UPPER EXTREMITY ROM:    Passive ROM Right eval Left eval 08/30/22 RT shld supine 09/06/21 RT shld supine 09/11/22 RT  shld  Active /Passive Supine 09/20/22 RT shld supine Act/Passive 10/09/22 Supine ACT/PASS 10/25/22 ACT/PASS standing 11/08/22  Act Standing 11/29/22 Act Standing 12/18/22 Act standing  Shoulder flexion 71   132 155 130/163 150/170 165/170 170/180 170 175 176  Shoulder extension               Shoulder abduction     92 102 90/110 115/133 125/150 140/150 158 165 162  Shoulder adduction               Shoulder internal rotation 51   65    Act 78 ACT 75     Shoulder external rotation 58   20 32 45/52 (45 degrees abd) 45/52(45 degrees abd) 50/60 ( at 45 degrees abd) 70/76 at 90 degrees abd     Elbow flexion               Elbow extension               Wrist flexion               Wrist extension               Wrist ulnar deviation               Wrist radial deviation               Wrist pronation               Wrist supination               (Blank rows = not tested)   UPPER EXTREMITY MMT: Strength testing of R shoulder deferred. Active movement in R elbow, wrist, hand/fingers     PALPATION:  Swelling noted throughout RUE from shoulder to hand. Ptient reports tenderness over incision.             TODAY'S TREATMENT:     01/23/23 UBE L 5 3 min each way DN BIL bicep, terres, cerv and UT PROM and jt capsule stretching BIL Overhead wt ball func strengthening- BIL OH flex,Single UE flex, single arm abd Wt  ball toss/throw Cable pulleys     01/15/23  UBE 3 min each way Seated Rows 45lb 2x12 Lat pulls 45lb 2x10 Elevated Push ups  Shoulder Flec 5lb 2x10  Shoulder abd 4lb 2x10 PROM RT shld and STW to teres  01/08/23 UBE 3 min each way Seated Row Lat Pull 7# free wt ex Cable pulley ex Wt ball toss Push ups PROM RT shld and STW to teres AROM RT 5-10 degrees less than LEft MMT WFLs BIL   01/01/23 UBE L 4 3 min fwd/ 3 min backward 5# PNF 2 sets 10 5# OH press 10 x  8# 10 x 8lb shld flex 2x10 5# shld abd 10 x 8# 10 x Elevated push ups 3x10 Pulley 15# shld ext 2 sets 10 Pulley rows  20# 2 sets 10 PROM BIL shld, pain with ER on RT  12/25/22 UBE L 4 2 min fwd/ 2 min backward Pulley 15# shld ext 2 sets 10 Pulley rows 20# 2 sets 10 Incline pull up 2 sets 10 5# PNF 2 sets 10 5# OH press 10 x  8# 10 x 5# shld flex 10 x 8# 10 x 5# shld abd 10 x 8# 10 x PROM BIL shld, pain with ER on RT Rec ant shdl and pec stretching as 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[employer]   12/18/22 UBE L3.5 x 3 min each Ladder flex & abductions stretch x 5each  Seated Rows 35lb & Lats 45lb 2x15 RUE blue ER/IR 2x10 Shoulder Ext 10lb 2x15 PROM Rt shoulder   12/04/22 UBE L3.5 x 3 min each Ladder flex & abductions stretch x 5each  Shoulder Flex & abd 2lb 2x10 each RUE green ER/IR 2x10 Seated Rows 35lb & Lats 45lb 2x15 PROM Rt shoulder   11/29/27 UBE L3 x 3 min each Ladder flex & abductions stretch x 7 each  Seated Rows 35lb & Lats 45lb 2x10 Shoulder Ext 10lb 2x10 ER red 2x10  PROM Rt shoulder    PATIENT EDUCATION: Education details: POC, initial HEP Person educated: Patient and Spouse Education method: Medical illustrator Education comprehension: verbalized understanding   HOME EXERCISE PROGRAM: [id number]   ASSESSMENT:   CLINICAL IMPRESSION-  .700 bales of hay 2 days ago sore but okay- alos on low dose prednisone. Seeing pain mang for some sort of injection in mid June. Decided to hold therapy for 1 month and let pt get inj form pain mang and start back at gym. Tight at end ranges. Some popping and crepitus BIL RT > Left. Func ROM    objectIVE IMPAIRMENTS: decreased mobility, decreased ROM, decreased strength, increased edema, increased muscle spasms, impaired flexibility, impaired sensation, impaired UE functional use, postural dysfunction, and pain.    ACTIVITY LIMITATIONS: carrying, lifting, bending, sleeping, bathing, toileting, dressing, reach over head, and hygiene/grooming   PARTICIPATION LIMITATIONS: meal prep, cleaning, laundry,  interpersonal relationship, driving, shopping, community activity, occupation, and yard work   PERSONAL FACTORS: Past/current experiences are also affecting patient's functional outcome.    REHAB POTENTIAL: Good   CLINICAL DECISION MAKING: Evolving/moderate complexity   EVALUATION COMPLEXITY: Moderate     GOALS: Goals reviewed with patient? Yes   SHORT TERM GOALS: Target date: 08/22/22   I with initial HEP Baseline: Goal status: 09/20/22 met   LONG TERM GOALS: Target date: 11/22/22   I with final HEP Baseline:  Goal status: met 08/30/22   2.  Increase R shoulder strength to at least 4/5  Baseline:  Goal status: RUE abd 4-/5, Flex 4/5, ER/IR 5/5  12/18/22 Progressing 12/25/22  MET 01/08/23   3.  Increase R shoulder ROM to WNL, following appropriate protocol restrictions Baseline:  Goal status: Met 12/18/22 01/08/23 RT 5-10 degrees less than left. 02/05/23 tight end range    4.  Patient will be able to complete all of his required daily activities with R shoulder pain < 3/10, following all appropriate restrictions Baseline:  Goal status: Met 11/29/22 for AD, not farm stuff  01/08/23 not met but doing everything 02/05/23 doing everything but pain after   5.  Patient will be able to lift 10# weight overhead with good control, in RUE, pain < 3/10 Baseline:  Goal status: INITIAL  11/29/22 on going  progressing 12/25/22  01/08/23 progressing  6.  Increase FOTO score to at least 59            Baseline: 25            Goal status: INITIAL PLAN:   PT FREQUENCY: 2x/week   PT DURATION: 12 weeks   PLANNED INTERVENTIONS: Therapeutic exercises, Therapeutic activity, Neuromuscular re-education, Balance training, Gait training, Patient/Family education, Self Care, Joint mobilization, Dry Needling, Electrical stimulation, Cryotherapy, Moist heat, Taping, Vasopneumatic device, Ultrasound, Ionotophoresis 4mg /ml Dexamethasone, and Manual therapy   PLAN FOR NEXT SESSION: assess and progress         Suren Payne,[name], PTA 02/05/2023, 2:47 PM  [facility] Health Outpatient Rehabilitation at [facility] [address] [facility]. [place], [place], [address] Phone: [phone]   Fax:  [phone] at [facility] [address]. [place], [place], [address] Phone: [phone]   Fax:  [phone]

## 2023-02-07 ENCOUNTER — Telehealth: Payer: Self-pay | Admitting: *Deleted

## 2023-02-07 NOTE — Telephone Encounter (Signed)
Oral swab drug screen was consistent for prescribed medications.  ?

## 2023-02-15 ENCOUNTER — Telehealth: Payer: Self-pay | Admitting: Physical Medicine and Rehabilitation

## 2023-02-15 MED ORDER — OXYCODONE-ACETAMINOPHEN 7.5-325 MG PO TABS: 1.0000 | ORAL_TABLET | Freq: Three times a day (TID) | ORAL | 0 refills | Status: AC | PRN

## 2023-02-15 MED ORDER — OXYCODONE-ACETAMINOPHEN 7.5-325 MG PO TABS: 1.0000 | ORAL_TABLET | Freq: Three times a day (TID) | ORAL | 0 refills | Status: DC | PRN

## 2023-02-15 NOTE — Telephone Encounter (Signed)
He needs his oxycodone refilled he wants it sent to [facility] on [address] in [place]

## 2023-02-15 NOTE — Addendum Note (Signed)
Addended by: Elijah Birk on: 02/15/2023 04:27 PM   Modules accepted: Orders

## 2023-02-18 ENCOUNTER — Encounter: Payer: BC Managed Care – PPO | Admitting: Physical Medicine and Rehabilitation

## 2023-03-12 ENCOUNTER — Ambulatory Visit: Payer: BC Managed Care – PPO | Attending: Orthopaedic Surgery | Admitting: Physical Therapy

## 2023-03-14 ENCOUNTER — Telehealth: Payer: Self-pay | Admitting: *Deleted

## 2023-03-14 NOTE — Telephone Encounter (Signed)
Patient calling to see if anything could be done. He allowed some family to use beach house and had his oxycodone locked in private bedroom. He says that someone switched out some of his pain meds for Advil. His next refill not due until 03/22/23. Patient  advised to contact pharmacy to see when the earliest he could refill b/c refill already submitted.

## 2023-03-20 ENCOUNTER — Telehealth: Payer: Self-pay | Admitting: *Deleted

## 2023-03-20 DIAGNOSIS — M25561 Pain in right knee: Secondary | ICD-10-CM | POA: Insufficient documentation

## 2023-03-20 DIAGNOSIS — M7918 Myalgia, other site: Secondary | ICD-10-CM | POA: Insufficient documentation

## 2023-03-20 DIAGNOSIS — G8929 Other chronic pain: Secondary | ICD-10-CM | POA: Insufficient documentation

## 2023-03-20 DIAGNOSIS — M436 Torticollis: Secondary | ICD-10-CM | POA: Insufficient documentation

## 2023-03-20 MED ORDER — METHYLPREDNISOLONE 4 MG PO TBPK: ORAL_TABLET | ORAL | 0 refills | Status: DC

## 2023-03-20 NOTE — Telephone Encounter (Signed)
Patient going out of town and requesting 03/22/23 script be authorized for early release. Patient call back [phone]

## 2023-03-20 NOTE — Telephone Encounter (Signed)
Called patient, worsening pain in shoulders over past few weeks. No red flag symptoms. Called pharmacy for early Percocet refill 7/17 and sent script for medrol dosepak. Patient to call if any concerns, otherwise keep August appointment.

## 2023-04-02 ENCOUNTER — Encounter: Payer: Self-pay | Admitting: Physical Medicine and Rehabilitation

## 2023-04-03 NOTE — Telephone Encounter (Signed)
Can someone please call his wife Rosalita Chessman back at [phone]

## 2023-04-09 DIAGNOSIS — I4891 Unspecified atrial fibrillation: Secondary | ICD-10-CM | POA: Diagnosis not present

## 2023-04-09 DIAGNOSIS — Z7982 Long term (current) use of aspirin: Secondary | ICD-10-CM | POA: Diagnosis not present

## 2023-04-09 DIAGNOSIS — R9431 Abnormal electrocardiogram [ECG] [EKG]: Secondary | ICD-10-CM | POA: Diagnosis not present

## 2023-04-09 DIAGNOSIS — R42 Dizziness and giddiness: Secondary | ICD-10-CM | POA: Diagnosis not present

## 2023-04-09 DIAGNOSIS — R079 Chest pain, unspecified: Secondary | ICD-10-CM | POA: Diagnosis not present

## 2023-04-09 DIAGNOSIS — R002 Palpitations: Secondary | ICD-10-CM | POA: Diagnosis not present

## 2023-04-15 ENCOUNTER — Encounter
Payer: BC Managed Care – PPO | Attending: Physical Medicine and Rehabilitation | Admitting: Physical Medicine and Rehabilitation

## 2023-04-15 VITALS — BP 156/96 | HR 95 | Ht 72.0 in | Wt 176.0 lb

## 2023-04-15 DIAGNOSIS — M7918 Myalgia, other site: Secondary | ICD-10-CM | POA: Diagnosis not present

## 2023-04-15 DIAGNOSIS — M25619 Stiffness of unspecified shoulder, not elsewhere classified: Secondary | ICD-10-CM | POA: Insufficient documentation

## 2023-04-15 DIAGNOSIS — Z5181 Encounter for therapeutic drug level monitoring: Secondary | ICD-10-CM | POA: Insufficient documentation

## 2023-04-15 DIAGNOSIS — Z029 Encounter for administrative examinations, unspecified: Secondary | ICD-10-CM | POA: Diagnosis not present

## 2023-04-15 DIAGNOSIS — G8929 Other chronic pain: Secondary | ICD-10-CM | POA: Insufficient documentation

## 2023-04-15 DIAGNOSIS — M25511 Pain in right shoulder: Secondary | ICD-10-CM | POA: Insufficient documentation

## 2023-04-15 DIAGNOSIS — M25512 Pain in left shoulder: Secondary | ICD-10-CM | POA: Diagnosis not present

## 2023-04-15 MED ORDER — OXYCODONE-ACETAMINOPHEN 7.5-325 MG PO TABS: 1.0000 | ORAL_TABLET | Freq: Three times a day (TID) | ORAL | 0 refills | Status: AC | PRN

## 2023-04-15 MED ORDER — OXYCODONE-ACETAMINOPHEN 7.5-325 MG PO TABS: 1.0000 | ORAL_TABLET | Freq: Three times a day (TID) | ORAL | 0 refills | Status: DC | PRN

## 2023-04-15 MED ORDER — GABAPENTIN 100 MG PO CAPS: 100.0000 mg | ORAL_CAPSULE | Freq: Every evening | ORAL | 0 refills | Status: DC | PRN

## 2023-04-15 NOTE — Patient Instructions (Signed)
  Chronic pain of both shoulders Encounter for therapeutic drug monitoring Encounter for opiate analgesic use agreement Patient wishes to pursue peripheral nerve stimulator with [facility]; has been advised they need to follow with Dr. Jerrilyn Cairo at [facility] for pain management prior to starting this. Referral sent today.  Will provide 3 months of prescription to bridge to chronic pain management at [facility]. OK to take 1.5 - 2 tabs at once as long as he stays within total pills daily.   Follow up only as needed.   Wishes to trial gabapentin to help in weaning Percocet; has had sedation with gabapentin in the past. Will give temporary script for 100 mg #30 tabs to use at nighttime as needed only. Will call if tolerated or more needed.   Decreased shoulder range of motion Send back to PT for one session for development of HEP for stretching, given reduced ROM today  Has had imaging in March with Orthopedics; Discussed risk for adhesive capsulitis with loss of internal rotation, will defer repeat MRI at this time and trial ROM exercises as above.    Myofascial pain  Advised can use TENs unit over neck and trapezius; would avoid placing stimulators directly over surgical hardware

## 2023-04-15 NOTE — Progress Notes (Signed)
Subjective:    Patient ID: Bruce Cobb, male    DOB: 01/02/1966, 57 y.o.   MRN: [id number]  HPI  HPI  Bruce Cobb is a 57 y.o. year old male  who  has a past medical history of BCC (basal cell carcinoma of skin), Hematospermia, Paroxysmal atrial fibrillation (HCC) (09/2017), Pars defect of lumbar spine, PONV (postoperative nausea and vomiting), Right knee DJD, and Shingles (2014).   They are presenting to PM&R clinic for follow up related to bilateral shoulder pain .  Plan from last visit:  Chronic pain syndrome Chronic pain of both shoulders Myofascial pain Today, you signed a pain contract with our clinic and performed a urine drug screen.  I will refill your Percocet 7.5 mg 3 times daily as needed for the next 3 months.  You will hear from [place] if there is any issue with your urine drug screen.  Please continue to abstain from alcohol use while taking your medications.   I will have you follow-up with me in the next 1 to 2 weeks for trigger point injections of the neck and shoulders   Use Voltaren gel over-the-counter twice daily for the next few weeks to help with adjunctive pain control   Occasional steroid Dosepaks once or twice a year for severe pain may be helpful for you; please message me through MyChart or call our office if you would need this.   Follow up with me in 3 months   Neck stiffness Continue physical therapy for your shoulders; ask them to help you with home neck stretches as part of your discharge program   Posterior knee pain, right We can readdress this at subsequent visits; I do not think that genicular nerve blocks would benefit you at this time, and hamstring tightness treatments/popliteus injections have already not been helpful  Discussed peripheral nerve stimulator referral vs. SPRINT trial as OP   Interval Hx:  - Therapies: Anterior left shoulder remains very painful to palpation and ROM remains really limited. Graduated from PT in May after doing  80 bales of hay; they felts it was better to save therapy days. He states his pain has not changed since getting out of PT, but his ROM has significantly decreased due to stretching.    - Follow ups: No longer seeing his surgeon, [place] no longer managing.    - Falls: none   - DME: none current   - Medications: Has been taking 1.5 tabs 5 days in a row due to severe pain. Wife stating regardless of activity level, when pain medication wears off he is in excruciating pain. Oxycodone has been causing GI issues    - Other concerns: Currently on a hear monitor for atrial fibrillation; had once prior, may have been caffiene and magnesium deficiency induced. Recently had a few presyncopal episodes, including on the way to our visit today, and has been getting lots of palpitations. Pending read.   Pain Inventory Average Pain 8 Pain Right Now 8 My pain is Dull aching  In the last 24 hours, has pain interfered with the following? General activity 9 Relation with others 9 Enjoyment of life 9 What TIME of day is your pain at its worst? morning  and daytime Sleep (in general) fair  Pain is worse with: standing and some activites Pain improves with:  nothing Relief from Meds: 5  ability to climb steps?  yes do you drive?  yes  employed # of hrs/week [employer] Do you have  any goals in this area?  yes  suicidal thoughts  x-rays 11/2022 of Right Shoulder by Dr. Everardo Pacific  Any changes since last visit?  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   Wife has a history of breast cancer, recurrent, status post bilateral mastectomy   Wife also had thyroid cancer.   The patient has been very athletic all his life, playing basketball, softball, tennis etc.  As of 09/2018 is not very active due to multiple MSK issues.   Social Determinants of Health   Financial Resource Strain: Not on file  Food Insecurity: Not on file  Transportation Needs: Not on file  Physical Activity: Not on file  Stress: Not on file  Social Connections: Unknown (01/16/2022)   Received from [facility]   Social Network    Social Network: Not on file   Past Surgical History:  Procedure Laterality Date   ANKLE FRACTURE SURGERY Left 1996   plated   KNEE ARTHROSCOPY Left 1990   plica removed   KNEE ARTHROSCOPY Right    7 total   NASAL SEPTUM SURGERY     ORIF DISTAL RADIUS FRACTURE Right    age 85   REVISION TOTAL KNEE ARTHROPLASTY Right 08/19/2017   REVISION TOTAL SHOULDER TO REVERSE TOTAL SHOULDER Left 10/11/2021   Procedure: LEFT REVISION TOTAL SHOULDER TO REVERSE TOTAL SHOULDER, RIGHT SHOULDER STERIOD INJECTION;  Surgeon: Bjorn Pippin, MD;  Location: [facility];  Service: Orthopedics;  Laterality: Left;   SHOULDER ARTHROSCOPY W/ ROTATOR CUFF REPAIR Right 2011   bicep tendon and labrium repair   SHOULDER ARTHROSCOPY WITH ROTATOR CUFF REPAIR Left 2012   w/ labrium repair   TOTAL KNEE ARTHROPLASTY Right 10/12/2013   Procedure: RIGHT TOTAL KNEE ARTHROPLASTY;  Surgeon: Nilda Simmer, MD;  Location: [facility];  Service: Orthopedics;  Laterality: Right;   VASECTOMY     Past Medical History:  Diagnosis Date   BCC  (basal cell carcinoma of skin)    Hematospermia    hx of, remotely    Paroxysmal atrial fibrillation (HCC) 09/2017   saw cards   Pars defect of lumbar spine    PONV (postoperative nausea and vomiting)    Right knee DJD    Shingles 2014   face   Ht 6' (1.829 m)   BMI 23.46 kg/m   Opioid Risk Score:   Fall Risk  Score:  `1  Depression screen [facility] 2/9     04/15/2023    2:32 PM 01/21/2023    3:08 PM 09/24/2018   12:21 PM  Depression screen PHQ 2/9  Decreased Interest 0 2 0  Down, Depressed, Hopeless 0 3 0  PHQ - 2 Score 0 5 0  Altered sleeping  3 1  Tired, decreased energy  1 0  Change in appetite  2 0  Feeling bad or failure about yourself   0 0  Trouble concentrating  0 0  Moving slowly or fidgety/restless  0 0  Suicidal thoughts  3 0  PHQ-9 Score  14 1    Review of Systems  Musculoskeletal:  Positive for neck stiffness.       Pain in both shoulders, Right knee Pain,   All other systems reviewed and are negative.      Objective:   Physical Exam  PE: Constitution: Appropriate appearance for age. No apparent distress   Resp: No respiratory distress. No accessory muscle usage. on RA and CTAB Cardio: Well perfused appearance. No peripheral edema. Abdomen: Nondistended. Nontender.   Psych: Appropriate mood and affect. Neuro: AAOx4. No apparent cognitive deficits. No apparent sensory deficits.  MSK: Strength 5/5 intact in all planes of motion, + pain with internal rotation bilaterally :   L shoulder: Internal rotation limited, otherwise full AROM Pain with TTP over coracoid process  Special Tests: Supraspinatus tests: Empty can  - ; Drop arm test  - Impingement signs: Hawkins  -; Neers - AC joint: Compression test - Biceps tests: Speed's  -; Alric Ran - Subscapularis tests: Gerber liftoff  Mild +; Belly test .- Slap lesions: Obriens - Instability tests: Apprehension -     R shoulder: Palpable AC joint separation, mild AROM limited in shoulder abduction,  internal rotation Pain with TTP over coracoid  Special Tests: Supraspinatus tests: Empty can  - ; Drop arm test  - cannot aactively range arm past 130 degrees abduction d/t discomfort Impingement signs: Hawkins  -; Neers - AC joint: Compression test - Biceps tests: Speed's  -; Alric Ran - Subscapularis tests: Gerber liftoff -; Belly test .- Slap lesions: Obriens - Instability tests: Apprehension -   Assessment & Plan:   Bruce Cobb is a 57 y.o. year old male  who  has a past medical history of BCC (basal cell carcinoma of skin), Hematospermia, Paroxysmal atrial fibrillation (HCC) (09/2017), Pars defect of lumbar spine, PONV (postoperative nausea and vomiting), Right knee DJD, and Shingles (2014).   They are presenting to PM&R clinic as a new patient for treatment of chronic bilateral shoulder and R knee pain.   Chronic pain of both shoulders Encounter for therapeutic drug monitoring Encounter for opiate analgesic use agreement  Patient wishes to pursue peripheral nerve stimulator with [facility]; has been advised they need to follow with Dr. Jerrilyn Cairo at [facility] for pain management prior to starting this. Referral sent today.  Will provide 3 months of prescription to bridge to chronic pain management at [facility]. OK to take 1.5 - 2 tabs at once as long as he stays within total pills daily.   Follow up only as needed.   Wishes to trial gabapentin to help in weaning Percocet; has had sedation with gabapentin in the past. Will give temporary script for 100 mg #30 tabs to use at nighttime as needed only. Will call if tolerated or more needed.   Decreased shoulder range of motion Send back to PT for one session  for development of HEP for stretching, given reduced ROM today  Has had imaging in March with Orthopedics; Discussed risk for adhesive capsulitis with loss of internal rotation, will defer repeat MRI at this time and trial ROM exercises as above.    Myofascial pain  Advised can use  TENs unit over neck and trapezius; would avoid placing stimulators directly over surgical hardware

## 2023-04-17 ENCOUNTER — Encounter: Payer: BC Managed Care – PPO | Admitting: Physical Medicine and Rehabilitation

## 2023-04-18 ENCOUNTER — Ambulatory Visit: Payer: Self-pay | Admitting: Physical Therapy

## 2023-04-18 DIAGNOSIS — Z5329 Procedure and treatment not carried out because of patient's decision for other reasons: Secondary | ICD-10-CM | POA: Diagnosis not present

## 2023-04-18 DIAGNOSIS — R0602 Shortness of breath: Secondary | ICD-10-CM | POA: Diagnosis not present

## 2023-04-18 DIAGNOSIS — R002 Palpitations: Secondary | ICD-10-CM | POA: Diagnosis not present

## 2023-04-19 DIAGNOSIS — F439 Reaction to severe stress, unspecified: Secondary | ICD-10-CM | POA: Diagnosis not present

## 2023-04-19 DIAGNOSIS — R0600 Dyspnea, unspecified: Secondary | ICD-10-CM | POA: Diagnosis not present

## 2023-04-19 DIAGNOSIS — R002 Palpitations: Secondary | ICD-10-CM | POA: Diagnosis not present

## 2023-04-19 DIAGNOSIS — Z789 Other specified health status: Secondary | ICD-10-CM | POA: Diagnosis not present

## 2023-04-23 DIAGNOSIS — I471 Supraventricular tachycardia, unspecified: Secondary | ICD-10-CM | POA: Diagnosis not present

## 2023-04-23 DIAGNOSIS — R002 Palpitations: Secondary | ICD-10-CM | POA: Diagnosis not present

## 2023-04-23 DIAGNOSIS — I472 Ventricular tachycardia, unspecified: Secondary | ICD-10-CM | POA: Diagnosis not present

## 2023-05-09 ENCOUNTER — Ambulatory Visit: Payer: BC Managed Care – PPO | Attending: Orthopaedic Surgery | Admitting: Physical Therapy

## 2023-05-09 ENCOUNTER — Encounter: Payer: Self-pay | Admitting: Physical Therapy

## 2023-05-09 ENCOUNTER — Other Ambulatory Visit: Payer: Self-pay

## 2023-05-09 DIAGNOSIS — M25611 Stiffness of right shoulder, not elsewhere classified: Secondary | ICD-10-CM

## 2023-05-09 DIAGNOSIS — M25512 Pain in left shoulder: Secondary | ICD-10-CM | POA: Insufficient documentation

## 2023-05-09 DIAGNOSIS — R293 Abnormal posture: Secondary | ICD-10-CM

## 2023-05-09 DIAGNOSIS — M25612 Stiffness of left shoulder, not elsewhere classified: Secondary | ICD-10-CM | POA: Diagnosis not present

## 2023-05-09 DIAGNOSIS — M6281 Muscle weakness (generalized): Secondary | ICD-10-CM | POA: Diagnosis not present

## 2023-05-09 DIAGNOSIS — G8929 Other chronic pain: Secondary | ICD-10-CM

## 2023-05-09 DIAGNOSIS — M25619 Stiffness of unspecified shoulder, not elsewhere classified: Secondary | ICD-10-CM | POA: Diagnosis not present

## 2023-05-09 DIAGNOSIS — M25511 Pain in right shoulder: Secondary | ICD-10-CM | POA: Diagnosis not present

## 2023-05-09 NOTE — Therapy (Signed)
OUTPATIENT PHYSICAL THERAPY SHOULDER EVALUATION   Patient Name: AUBRY RIEHM MRN: [id number] DOB:12/17/1965, 57 y.o., male Today's Date: 05/09/2023  END OF SESSION:  PT End of Session - 05/09/23 1605     Visit Number 1    Number of Visits 5    Date for PT Re-Evaluation 07/18/23    Authorization Type BCBS    Authorization - Number of Visits 30    PT Start Time 1433    PT Stop Time 1512    PT Time Calculation (min) 39 min    Activity Tolerance Patient tolerated treatment well    Behavior During Therapy WFL for tasks assessed/performed             Past Medical History:  Diagnosis Date   BCC (basal cell carcinoma of skin)    Hematospermia    hx of, remotely    Paroxysmal atrial fibrillation (HCC) 09/2017   saw cards   Pars defect of lumbar spine    PONV (postoperative nausea and vomiting)    Right knee DJD    Shingles 2014   face   Past Surgical History:  Procedure Laterality Date   ANKLE FRACTURE SURGERY Left 1996   plated   KNEE ARTHROSCOPY Left 1990   plica removed   KNEE ARTHROSCOPY Right    7 total   NASAL SEPTUM SURGERY     ORIF DISTAL RADIUS FRACTURE Right    age 76   REVISION TOTAL KNEE ARTHROPLASTY Right 08/19/2017   REVISION TOTAL SHOULDER TO REVERSE TOTAL SHOULDER Left 10/11/2021   Procedure: LEFT REVISION TOTAL SHOULDER TO REVERSE TOTAL SHOULDER, RIGHT SHOULDER STERIOD INJECTION;  Surgeon: Bjorn Pippin, MD;  Location: [facility];  Service: Orthopedics;  Laterality: Left;   SHOULDER ARTHROSCOPY W/ ROTATOR CUFF REPAIR Right 2011   bicep tendon and labrium repair   SHOULDER ARTHROSCOPY WITH ROTATOR CUFF REPAIR Left 2012   w/ labrium repair   TOTAL KNEE ARTHROPLASTY Right 10/12/2013   Procedure: RIGHT TOTAL KNEE ARTHROPLASTY;  Surgeon: Nilda Simmer, MD;  Location: [facility];  Service: Orthopedics;  Laterality: Right;   VASECTOMY     Patient Active Problem List   Diagnosis Date Noted   Encounter for opiate analgesic use agreement 04/15/2023   Decreased range  of motion of shoulder 04/15/2023   Chronic pain of both shoulders 03/20/2023   Posterior knee pain, right 03/20/2023   Neck stiffness 03/20/2023   Myofascial pain 03/20/2023   BCC (basal cell carcinoma of skin) 06/29/2019   Chronic leukopenia 09/24/2018   PCP NOTES >>>>>>>>>>>>>> 09/24/2018   Anxiety 09/24/2018   Insomnia 09/24/2018   History of atrial fibrillation 09/24/2018   Irregular heart rhythm 02/24/2016   Dysfunction of both eustachian tubes 01/18/2016   DJD (degenerative joint disease) 10/12/2013   Pars defect of lumbar spine     PCP: no PCP   REFERRING PROVIDER: Angelina Sheriff, DO  REFERRING DIAG: M25.619 (ICD-10-CM) - Decreased range of motion of shoulder, unspecified laterality  THERAPY DIAG:  Muscle weakness (generalized)  Stiffness of left shoulder, not elsewhere classified  Stiffness of right shoulder, not elsewhere classified  Abnormal posture  Chronic left shoulder pain  Chronic right shoulder pain  Rationale for Evaluation and Treatment: Rehabilitation  ONSET DATE: chronic   SUBJECTIVE:  SUBJECTIVE STATEMENT:  Both shoulders are still bothering me, MD is working on getting me sent to [facility] for possible peripheral nerve stimulators in B shoulders.  Doctor sent me back here for stretching my shoulders, I never have a moment where its not painful and its the both on same sides. Its sore all the time in the front of my shoulders, it doesn't matter if I'm busy picking a lot up or if I take rest days, I still have pain in the front of my shoulders. It feels like someone is grabbing my shoulder muscles and is twisting tightening feeling. L shoulder was reverse TSA with revision February 2023, R shoulder is conventional TSA done December 1st. I've had to go to the ED twice in the past  3 weeks due to heart palpitations, have been taking magnesium supplements and that's helped a lot. Tried massage, went to a formal masseuse, helps for like a day but then shoulders start hurting again. Having a hard time turning my neck due to shoulder pain.   Hand dominance: Right  PERTINENT HISTORY: Skin cancer, PAF, pars defect of Lx spine, R knee DJD, L ankle fx surgery, B knee arthorscopies, distal radius fx, R TKR with revision, shoulder arthroscopy with rotator cuff repair 2011 and 2012, revision total shoulder to reverse total shoulder 2023  PAIN:  Are you having pain? Yes: NPRS scale: 5/10 Pain location: bilateral anterior shoulders  Pain description: twisting/tightness, nagging that never goes away  Aggravating factors: everything to do with taking a shower- washing/drying hair, horizontal ADD, horizontal ADD + rotation is the worst  Relieving factors: nothing   PRECAUTIONS: None  RED FLAGS: None   WEIGHT BEARING RESTRICTIONS: No  FALLS:  Has patient fallen in last 6 months? No  LIVING ENVIRONMENT: Lives with: lives with their family Lives in: House/apartment   OCCUPATION: Real estate consulting   PLOF: Independent, Independent with basic ADLs, Independent with gait, and Independent with transfers  PATIENT GOALS:get pain in shoulders down, get shoulders moving better  NEXT MD VISIT: Referring PRN   OBJECTIVE:       COGNITION: Overall cognitive status: Within functional limits for tasks assessed     SENSATION: Not tested  POSTURE: Increased thoracic kyphosis, rounded shoulders, forward head   UPPER EXTREMITY ROM:   Active ROM Right eval Left eval  Shoulder flexion 100* AROM, about 120* PROM  138* AROM, about 170* PROM   Shoulder extension    Shoulder abduction 101* AROM about 110* PROM  178*  Shoulder adduction    Shoulder internal rotation 60* at about 70* ABD  61* at about 70* ABD   Shoulder external rotation 31* at about 70* ABD  60* at about  70* ABD   Elbow flexion    Elbow extension    Wrist flexion    Wrist extension    Wrist ulnar deviation    Wrist radial deviation    Wrist pronation    Wrist supination    (Blank rows = not tested)  UPPER EXTREMITY MMT:  MMT Right eval Left eval  Shoulder flexion 3+ pain limited  4+  Shoulder extension    Shoulder abduction 3+ pain lmited  4+   Shoulder adduction    Shoulder internal rotation    Shoulder external rotation    Middle trapezius    Lower trapezius    Elbow flexion 5/5 all angles  5/5 all angles   Elbow extension    Wrist flexion    Wrist extension  Wrist ulnar deviation    Wrist radial deviation    Wrist pronation    Wrist supination    Grip strength (lbs)    (Blank rows = not tested)     PALPATION:  B anterior delts extremely tender to touch, pecs and upper traps tight but OK, biceps WNL    TODAY'S TREATMENT:                                                                                                                                         DATE:   Eval  Objective measures, appropriate care planning/education, ionto patches B anterior shoulders    PATIENT EDUCATION: Education details: exam findings, POC, HEP, ionto- how long to wear (4 hour patches/72mL dex) and signs/symptoms that would require him to remove patches early Person educated: Patient Education method: Explanation, Demonstration, and Handouts Education comprehension: verbalized understanding, returned demonstration, and needs further education  HOME EXERCISE PROGRAM: HEP prepared by PTA   ASSESSMENT:  CLINICAL IMPRESSION: Patient is a 57 y.o. M who was seen today for physical therapy evaluation and treatment for M25.619 (ICD-10-CM) - Decreased range of motion of shoulder, unspecified laterality. He is really well known to this clinic and has been seen here multiple times for skilled care following shoulder surgeries and revisions. Exam findings as above, pain seems to be  centered in anterior shoulders in particular B anterior delt groups,  I have very low suspicion for biceps involvement at this time given pain patterns and exam findings. Provided extensive HEP kindly prepared by PTA, also tried B ionto patches to anterior shoulders as well. Will have him focus on HEP and work on interventions such as dry needling, ionto, and manual techniques that he is not able to perform for himself here in clinic. May be receiving peripheral nerve stimulators in B shoulders at [facility], procedure date and details pending.   OBJECTIVE IMPAIRMENTS: decreased ROM, decreased strength, hypomobility, increased fascial restrictions, increased muscle spasms, impaired flexibility, impaired UE functional use, improper body mechanics, postural dysfunction, and pain.   ACTIVITY LIMITATIONS: carrying, lifting, bathing, toileting, dressing, reach over head, and hygiene/grooming  PARTICIPATION LIMITATIONS: cleaning, laundry, driving, shopping, community activity, occupation, and yard work  PERSONAL FACTORS: Age, Behavior pattern, Fitness, Past/current experiences, Social background, and Time since onset of injury/illness/exacerbation are also affecting patient's functional outcome.   REHAB POTENTIAL: Fair chronicity of pain/impairments, limited visits left on insurance for this year   CLINICAL DECISION MAKING: Stable/uncomplicated  EVALUATION COMPLEXITY: Low   GOALS: Goals reviewed with patient? Yes  SHORT TERM GOALS: Target date: STG = LTGs     LONG TERM GOALS: Target date: 07/18/2023    R shoulder flexion AROM to be at least 130 degrees, L shoulder flexion AROM to be at least 160 degrees  Baseline:  Goal status: INITIAL  2.  R shoulder ABD AROM to be at least 140* Baseline:  Goal  status: INITIAL  3.  Will be able to perform all self care and hygiene activities with B shoulder pain no more than 2/10 Baseline:  Goal status: INITIAL  4.  R shoulder ER AROM to reach at least  50* at 60* ABD  Baseline:  Goal status: INITIAL  5.  Will be able to perform all yard work/home care tasks with B shoulder pain no more than 2/10 Baseline:  Goal status: INITIAL  6.  Anterior delt muscle spasms to have improved by 50% to assist with pain control  Baseline:  Goal status: INITIAL  PLAN:  PT FREQUENCY:  5 visits   PT DURATION: 10 weeks  PLANNED INTERVENTIONS: Therapeutic exercises, Therapeutic activity, Patient/Family education, Self Care, Joint mobilization, Aquatic Therapy, Dry Needling, Electrical stimulation, Cryotherapy, Moist heat, Ultrasound, Ionotophoresis 4mg /ml Dexamethasone, Manual therapy, and Re-evaluation  PLAN FOR NEXT SESSION: how did he feel after ionto? Heavy manual focus with HEP progressions as able, visits very limited. Try DN to anterior delt/anterior shoulder region if appropriate (per DN certified PT)  Nedra Hai, PT, DPT 05/09/23 4:07 PM

## 2023-05-21 ENCOUNTER — Ambulatory Visit: Payer: BC Managed Care – PPO | Admitting: Physical Therapy

## 2023-05-21 DIAGNOSIS — M25619 Stiffness of unspecified shoulder, not elsewhere classified: Secondary | ICD-10-CM | POA: Diagnosis not present

## 2023-05-21 DIAGNOSIS — M25511 Pain in right shoulder: Secondary | ICD-10-CM | POA: Diagnosis not present

## 2023-05-21 DIAGNOSIS — G8929 Other chronic pain: Secondary | ICD-10-CM | POA: Diagnosis not present

## 2023-05-21 DIAGNOSIS — M25512 Pain in left shoulder: Secondary | ICD-10-CM | POA: Diagnosis not present

## 2023-05-21 DIAGNOSIS — M6281 Muscle weakness (generalized): Secondary | ICD-10-CM | POA: Diagnosis not present

## 2023-05-21 DIAGNOSIS — M25611 Stiffness of right shoulder, not elsewhere classified: Secondary | ICD-10-CM | POA: Diagnosis not present

## 2023-05-21 DIAGNOSIS — R293 Abnormal posture: Secondary | ICD-10-CM

## 2023-05-21 DIAGNOSIS — M25612 Stiffness of left shoulder, not elsewhere classified: Secondary | ICD-10-CM | POA: Diagnosis not present

## 2023-05-21 NOTE — Therapy (Signed)
PLAN:  PT FREQUENCY:  5 visits   PT DURATION: 10 weeks  PLANNED INTERVENTIONS: Therapeutic exercises, Therapeutic activity, Patient/Family education, Self Care, Joint mobilization, Aquatic Therapy, Dry Needling, Electrical stimulation, Cryotherapy, Moist heat, Ultrasound, Ionotophoresis 4mg /ml Dexamethasone, Manual therapy, and Re-evaluation  PLAN FOR NEXT SESSION: how did he feel after ionto? Heavy manual focus with HEP progressions as able, visits very limited. Try DN to anterior delt/anterior  shoulder region if appropriate (per DN certified PT)  Patient Details  Name: Bruce Cobb MRN: [id number] Date of Birth: [birth year] Referring Provider:  Angelina Sheriff, DO  Encounter Date: 05/21/2023   Suanne Marker, PTA 05/21/2023, 12:27 PM  [facility]. [place], [place], [address] Phone: [phone]   Fax:  [phone]  PLAN:  PT FREQUENCY:  5 visits   PT DURATION: 10 weeks  PLANNED INTERVENTIONS: Therapeutic exercises, Therapeutic activity, Patient/Family education, Self Care, Joint mobilization, Aquatic Therapy, Dry Needling, Electrical stimulation, Cryotherapy, Moist heat, Ultrasound, Ionotophoresis 4mg /ml Dexamethasone, Manual therapy, and Re-evaluation  PLAN FOR NEXT SESSION: how did he feel after ionto? Heavy manual focus with HEP progressions as able, visits very limited. Try DN to anterior delt/anterior  shoulder region if appropriate (per DN certified PT)  Patient Details  Name: Bruce Cobb MRN: [id number] Date of Birth: [birth year] Referring Provider:  Angelina Sheriff, DO  Encounter Date: 05/21/2023   Suanne Marker, PTA 05/21/2023, 12:27 PM  [facility] at [facility]. [place], [place], [address] Phone: [phone]   Fax:  [phone]  OUTPATIENT PHYSICAL THERAPY SHOULDER    Patient Name: Bruce Cobb MRN: [id number] DOB:1966/04/12, 57 y.o., male Today's Date: 05/21/2023  END OF SESSION:  PT End of Session - 05/21/23 1227     Visit Number 2    Number of Visits 5    Date for PT Re-Evaluation 07/18/23    Authorization Type BCBS    PT Start Time 1227    PT Stop Time 1315    PT Time Calculation (min) 48 min             Past Medical History:  Diagnosis Date   BCC (basal cell carcinoma of skin)    Hematospermia    hx of, remotely    Paroxysmal atrial fibrillation (HCC) 09/2017   saw cards   Pars defect of lumbar spine    PONV (postoperative nausea and vomiting)    Right knee DJD    Shingles 2014   face   Past Surgical History:  Procedure Laterality Date   ANKLE FRACTURE SURGERY Left 1996   plated   KNEE ARTHROSCOPY Left 1990   plica removed   KNEE ARTHROSCOPY Right    7 total   NASAL SEPTUM SURGERY     ORIF DISTAL RADIUS FRACTURE Right    age 34   REVISION TOTAL KNEE ARTHROPLASTY Right 08/19/2017   REVISION TOTAL SHOULDER TO REVERSE TOTAL SHOULDER Left 10/11/2021   Procedure: LEFT REVISION TOTAL SHOULDER TO REVERSE TOTAL SHOULDER, RIGHT SHOULDER STERIOD INJECTION;  Surgeon: Bjorn Pippin, MD;  Location: [facility];  Service: Orthopedics;  Laterality: Left;   SHOULDER ARTHROSCOPY W/ ROTATOR CUFF REPAIR Right 2011   bicep tendon and labrium repair   SHOULDER ARTHROSCOPY WITH ROTATOR CUFF REPAIR Left 2012   w/ labrium repair   TOTAL KNEE ARTHROPLASTY Right 10/12/2013   Procedure: RIGHT TOTAL KNEE ARTHROPLASTY;  Surgeon: Nilda Simmer, MD;  Location: [facility];  Service: Orthopedics;  Laterality: Right;   VASECTOMY     Patient Active Problem List   Diagnosis Date Noted   Encounter for opiate analgesic use agreement 04/15/2023   Decreased range of motion of shoulder 04/15/2023   Chronic pain of both shoulders 03/20/2023   Posterior knee pain, right 03/20/2023   Neck stiffness 03/20/2023   Myofascial pain  03/20/2023   BCC (basal cell carcinoma of skin) 06/29/2019   Chronic leukopenia 09/24/2018   PCP NOTES >>>>>>>>>>>>>> 09/24/2018   Anxiety 09/24/2018   Insomnia 09/24/2018   History of atrial fibrillation 09/24/2018   Irregular heart rhythm 02/24/2016   Dysfunction of both eustachian tubes 01/18/2016   DJD (degenerative joint disease) 10/12/2013   Pars defect of lumbar spine     PCP: no PCP   REFERRING PROVIDER: Angelina Sheriff, DO  REFERRING DIAG: M25.619 (ICD-10-CM) - Decreased range of motion of shoulder, unspecified laterality  THERAPY DIAG:  Muscle weakness (generalized)  Stiffness of left shoulder, not elsewhere classified  Stiffness of right shoulder, not elsewhere classified  Abnormal posture  Rationale for Evaluation and Treatment: Rehabilitation  ONSET DATE: chronic   SUBJECTIVE:  OUTPATIENT PHYSICAL THERAPY SHOULDER    Patient Name: Bruce Cobb MRN: [id number] DOB:1966/04/12, 57 y.o., male Today's Date: 05/21/2023  END OF SESSION:  PT End of Session - 05/21/23 1227     Visit Number 2    Number of Visits 5    Date for PT Re-Evaluation 07/18/23    Authorization Type BCBS    PT Start Time 1227    PT Stop Time 1315    PT Time Calculation (min) 48 min             Past Medical History:  Diagnosis Date   BCC (basal cell carcinoma of skin)    Hematospermia    hx of, remotely    Paroxysmal atrial fibrillation (HCC) 09/2017   saw cards   Pars defect of lumbar spine    PONV (postoperative nausea and vomiting)    Right knee DJD    Shingles 2014   face   Past Surgical History:  Procedure Laterality Date   ANKLE FRACTURE SURGERY Left 1996   plated   KNEE ARTHROSCOPY Left 1990   plica removed   KNEE ARTHROSCOPY Right    7 total   NASAL SEPTUM SURGERY     ORIF DISTAL RADIUS FRACTURE Right    age 34   REVISION TOTAL KNEE ARTHROPLASTY Right 08/19/2017   REVISION TOTAL SHOULDER TO REVERSE TOTAL SHOULDER Left 10/11/2021   Procedure: LEFT REVISION TOTAL SHOULDER TO REVERSE TOTAL SHOULDER, RIGHT SHOULDER STERIOD INJECTION;  Surgeon: Bjorn Pippin, MD;  Location: [facility];  Service: Orthopedics;  Laterality: Left;   SHOULDER ARTHROSCOPY W/ ROTATOR CUFF REPAIR Right 2011   bicep tendon and labrium repair   SHOULDER ARTHROSCOPY WITH ROTATOR CUFF REPAIR Left 2012   w/ labrium repair   TOTAL KNEE ARTHROPLASTY Right 10/12/2013   Procedure: RIGHT TOTAL KNEE ARTHROPLASTY;  Surgeon: Nilda Simmer, MD;  Location: [facility];  Service: Orthopedics;  Laterality: Right;   VASECTOMY     Patient Active Problem List   Diagnosis Date Noted   Encounter for opiate analgesic use agreement 04/15/2023   Decreased range of motion of shoulder 04/15/2023   Chronic pain of both shoulders 03/20/2023   Posterior knee pain, right 03/20/2023   Neck stiffness 03/20/2023   Myofascial pain  03/20/2023   BCC (basal cell carcinoma of skin) 06/29/2019   Chronic leukopenia 09/24/2018   PCP NOTES >>>>>>>>>>>>>> 09/24/2018   Anxiety 09/24/2018   Insomnia 09/24/2018   History of atrial fibrillation 09/24/2018   Irregular heart rhythm 02/24/2016   Dysfunction of both eustachian tubes 01/18/2016   DJD (degenerative joint disease) 10/12/2013   Pars defect of lumbar spine     PCP: no PCP   REFERRING PROVIDER: Angelina Sheriff, DO  REFERRING DIAG: M25.619 (ICD-10-CM) - Decreased range of motion of shoulder, unspecified laterality  THERAPY DIAG:  Muscle weakness (generalized)  Stiffness of left shoulder, not elsewhere classified  Stiffness of right shoulder, not elsewhere classified  Abnormal posture  Rationale for Evaluation and Treatment: Rehabilitation  ONSET DATE: chronic   SUBJECTIVE:

## 2023-05-22 DIAGNOSIS — M25512 Pain in left shoulder: Secondary | ICD-10-CM | POA: Diagnosis not present

## 2023-05-22 DIAGNOSIS — M25511 Pain in right shoulder: Secondary | ICD-10-CM | POA: Diagnosis not present

## 2023-06-04 ENCOUNTER — Encounter: Payer: Self-pay | Admitting: Physical Therapy

## 2023-06-04 ENCOUNTER — Ambulatory Visit: Payer: BC Managed Care – PPO | Attending: Orthopaedic Surgery | Admitting: Physical Therapy

## 2023-06-04 DIAGNOSIS — R293 Abnormal posture: Secondary | ICD-10-CM | POA: Diagnosis not present

## 2023-06-04 DIAGNOSIS — M25512 Pain in left shoulder: Secondary | ICD-10-CM | POA: Diagnosis not present

## 2023-06-04 DIAGNOSIS — M25611 Stiffness of right shoulder, not elsewhere classified: Secondary | ICD-10-CM | POA: Diagnosis not present

## 2023-06-04 DIAGNOSIS — M25511 Pain in right shoulder: Secondary | ICD-10-CM | POA: Insufficient documentation

## 2023-06-04 DIAGNOSIS — M25612 Stiffness of left shoulder, not elsewhere classified: Secondary | ICD-10-CM | POA: Insufficient documentation

## 2023-06-04 DIAGNOSIS — M6281 Muscle weakness (generalized): Secondary | ICD-10-CM | POA: Insufficient documentation

## 2023-06-04 DIAGNOSIS — G8929 Other chronic pain: Secondary | ICD-10-CM | POA: Diagnosis not present

## 2023-06-04 NOTE — Therapy (Signed)
OUTPATIENT PHYSICAL THERAPY SHOULDER    Patient Name: Bruce Cobb MRN: [id number] DOB:1966-04-12, 57 y.o., male Today's Date: 06/04/2023  END OF SESSION:  PT End of Session - 06/04/23 1519     Visit Number 3    Date for PT Re-Evaluation 07/18/23    PT Start Time 1519    PT Stop Time 1600    PT Time Calculation (min) 41 min    Activity Tolerance Patient tolerated treatment well    Behavior During Therapy WFL for tasks assessed/performed             Past Medical History:  Diagnosis Date   BCC (basal cell carcinoma of skin)    Hematospermia    hx of, remotely    Paroxysmal atrial fibrillation (HCC) 09/2017   saw cards   Pars defect of lumbar spine    PONV (postoperative nausea and vomiting)    Right knee DJD    Shingles 2014   face   Past Surgical History:  Procedure Laterality Date   ANKLE FRACTURE SURGERY Left 1996   plated   KNEE ARTHROSCOPY Left 1990   plica removed   KNEE ARTHROSCOPY Right    7 total   NASAL SEPTUM SURGERY     ORIF DISTAL RADIUS FRACTURE Right    age 59   REVISION TOTAL KNEE ARTHROPLASTY Right 08/19/2017   REVISION TOTAL SHOULDER TO REVERSE TOTAL SHOULDER Left 10/11/2021   Procedure: LEFT REVISION TOTAL SHOULDER TO REVERSE TOTAL SHOULDER, RIGHT SHOULDER STERIOD INJECTION;  Surgeon: Bjorn Pippin, MD;  Location: [facility];  Service: Orthopedics;  Laterality: Left;   SHOULDER ARTHROSCOPY W/ ROTATOR CUFF REPAIR Right 2011   bicep tendon and labrium repair   SHOULDER ARTHROSCOPY WITH ROTATOR CUFF REPAIR Left 2012   w/ labrium repair   TOTAL KNEE ARTHROPLASTY Right 10/12/2013   Procedure: RIGHT TOTAL KNEE ARTHROPLASTY;  Surgeon: Nilda Simmer, MD;  Location: [facility];  Service: Orthopedics;  Laterality: Right;   VASECTOMY     Patient Active Problem List   Diagnosis Date Noted   Encounter for opiate analgesic use agreement 04/15/2023   Decreased range of motion of shoulder 04/15/2023   Chronic pain of both shoulders 03/20/2023   Posterior knee pain,  right 03/20/2023   Neck stiffness 03/20/2023   Myofascial pain 03/20/2023   BCC (basal cell carcinoma of skin) 06/29/2019   Chronic leukopenia 09/24/2018   PCP NOTES >>>>>>>>>>>>>> 09/24/2018   Anxiety 09/24/2018   Insomnia 09/24/2018   History of atrial fibrillation 09/24/2018   Irregular heart rhythm 02/24/2016   Dysfunction of both eustachian tubes 01/18/2016   DJD (degenerative joint disease) 10/12/2013   Pars defect of lumbar spine     PCP: no PCP   REFERRING PROVIDER: Angelina Sheriff, DO  REFERRING DIAG: M25.619 (ICD-10-CM) - Decreased range of motion of shoulder, unspecified laterality  THERAPY DIAG:  Muscle weakness (generalized)  Stiffness of left shoulder, not elsewhere classified  Stiffness of right shoulder, not elsewhere classified  Abnormal posture  Chronic left shoulder pain  Chronic right shoulder pain  Rationale for Evaluation and Treatment: Rehabilitation  ONSET DATE: chronic   SUBJECTIVE:  SUBJECTIVE STATEMENT:  "Shoulder feel terrible"  EVAL Both shoulders are still bothering me, MD is working on getting me sent to [facility] for possible peripheral nerve stimulators in B shoulders.  Doctor sent me back here for stretching my shoulders, I never have a moment where its not painful and its the both on same sides. Its sore all the time in the front of my shoulders, it doesn't matter if I'm busy picking a lot up or if I take rest days, I still have pain in the front of my shoulders. It feels like someone is grabbing my shoulder muscles and is twisting tightening feeling. L shoulder was reverse TSA with revision February 2023, R shoulder is conventional TSA done December 1st. I've had to go to the ED twice in the past 3 weeks due to heart palpitations, have been taking magnesium  supplements and that's helped a lot. Tried massage, went to a formal masseuse, helps for like a day but then shoulders start hurting again. Having a hard time turning my neck due to shoulder pain.   Hand dominance: Right  PERTINENT HISTORY: Skin cancer, PAF, pars defect of Lx spine, R knee DJD, L ankle fx surgery, B knee arthorscopies, distal radius fx, R TKR with revision, shoulder arthroscopy with rotator cuff repair 2011 and 2012, revision total shoulder to reverse total shoulder 2023  PAIN:  Are you having pain? Yes: NPRS scale: 6/10 Pain location: bilateral anterior shoulders  Pain description: twisting/tightness, nagging that never goes away  Aggravating factors: everything to do with taking a shower- washing/drying hair, horizontal ADD, horizontal ADD + rotation is the worst  Relieving factors: nothing   PRECAUTIONS: None  RED FLAGS: None   WEIGHT BEARING RESTRICTIONS: No  FALLS:  Has patient fallen in last 6 months? No  LIVING ENVIRONMENT: Lives with: lives with their family Lives in: House/apartment   OCCUPATION: Real estate consulting   PLOF: Independent, Independent with basic ADLs, Independent with gait, and Independent with transfers  PATIENT GOALS:get pain in shoulders down, get shoulders moving better  NEXT MD VISIT: Referring PRN   OBJECTIVE:       COGNITION: Overall cognitive status: Within functional limits for tasks assessed     SENSATION: Not tested  POSTURE: Increased thoracic kyphosis, rounded shoulders, forward head   UPPER EXTREMITY ROM:   Active ROM Right eval Left eval  Shoulder flexion 100* AROM, about 120* PROM  138* AROM, about 170* PROM   Shoulder extension    Shoulder abduction 101* AROM about 110* PROM  178*  Shoulder adduction    Shoulder internal rotation 60* at about 70* ABD  61* at about 70* ABD   Shoulder external rotation 31* at about 70* ABD  60* at about 70* ABD   Elbow flexion    Elbow extension    Wrist  flexion    Wrist extension    Wrist ulnar deviation    Wrist radial deviation    Wrist pronation    Wrist supination    (Blank rows = not tested)  UPPER EXTREMITY MMT:  MMT Right eval Left eval  Shoulder flexion 3+ pain limited  4+  Shoulder extension    Shoulder abduction 3+ pain lmited  4+   Shoulder adduction    Shoulder internal rotation    Shoulder external rotation    Middle trapezius    Lower trapezius    Elbow flexion 5/5 all angles  5/5 all angles   Elbow extension    Wrist flexion  Wrist extension    Wrist ulnar deviation    Wrist radial deviation    Wrist pronation    Wrist supination    Grip strength (lbs)    (Blank rows = not tested)     PALPATION:  B anterior delts extremely tender to touch, pecs and upper traps tight but OK, biceps WNL    TODAY'S TREATMENT:                                                                                                                                         DATE:  06/04/23 Aggressive stretching BIL shld all planes with jt mobs Good ROM left shld , RT Shld limited  motion with pain in ant shld with rotation   05/21/23 Aggressive stretching BIL shld all planes with jt mobs Good ROM left shld , RT fait with pain in ant shld and pain with rotation Eval  Objective measures, appropriate care planning/education, ionto patches B anterior shoulders    PATIENT EDUCATION: Education details: exam findings, POC, HEP, ionto- how long to wear (4 hour patches/68mL dex) and signs/symptoms that would require him to remove patches early Person educated: Patient Education method: Explanation, Demonstration, and Handouts Education comprehension: verbalized understanding, returned demonstration, and needs further education  HOME EXERCISE PROGRAM: HEP prepared by PTA   ASSESSMENT:  CLINICAL IMPRESSION: Continued with aggressive stretching as written in orders. Pt reports receiving injections in shoulder at [facility] for pain  management.  OBJECTIVE IMPAIRMENTS: decreased ROM, decreased strength, hypomobility, increased fascial restrictions, increased muscle spasms, impaired flexibility, impaired UE functional use, improper body mechanics, postural dysfunction, and pain.   ACTIVITY LIMITATIONS: carrying, lifting, bathing, toileting, dressing, reach over head, and hygiene/grooming  PARTICIPATION LIMITATIONS: cleaning, laundry, driving, shopping, community activity, occupation, and yard work  PERSONAL FACTORS: Age, Behavior pattern, Fitness, Past/current experiences, Social background, and Time since onset of injury/illness/exacerbation are also affecting patient's functional outcome.   REHAB POTENTIAL: Fair chronicity of pain/impairments, limited visits left on insurance for this year   CLINICAL DECISION MAKING: Stable/uncomplicated  EVALUATION COMPLEXITY: Low   GOALS: Goals reviewed with patient? Yes  SHORT TERM GOALS: Target date: STG = LTGs     LONG TERM GOALS: Target date: 07/18/2023    R shoulder flexion AROM to be at least 130 degrees, L shoulder flexion AROM to be at least 160 degrees  Baseline:  Goal status: INITIAL  2.  R shoulder ABD AROM to be at least 140* Baseline:  Goal status: INITIAL  3.  Will be able to perform all self care and hygiene activities with B shoulder pain no more than 2/10 Baseline:  Goal status: INITIAL  4.  R shoulder ER AROM to reach at least 50* at 60* ABD  Baseline:  Goal status: INITIAL  5.  Will be able to perform all yard work/home care tasks with B shoulder pain no more than 2/10 Baseline:  Goal status: INITIAL  6.  Anterior delt muscle spasms to have improved by 50% to assist with pain control  Baseline:  Goal status: INITIAL  PLAN:  PT FREQUENCY:  5 visits   PT DURATION: 10 weeks  PLANNED INTERVENTIONS: Therapeutic exercises, Therapeutic activity, Patient/Family education, Self Care, Joint mobilization, Aquatic Therapy, Dry Needling,  Electrical stimulation, Cryotherapy, Moist heat, Ultrasound, Ionotophoresis 4mg /ml Dexamethasone, Manual therapy, and Re-evaluation  PLAN FOR NEXT SESSION: how did he feel after ionto? Heavy manual focus with HEP progressions as able, visits very limited. Try DN to anterior delt/anterior shoulder region if appropriate (per DN certified PT)     Grayce Sessions, PTA 06/04/2023, 3:19 PM

## 2023-06-04 NOTE — Progress Notes (Signed)
Referring-self-referral Reason for referral-palpitations  HPI: 57 year old male for evaluation of palpitations by self-referral.  Previously followed at [facility].  He has a distant history of atrial fibrillation by report.  Laboratories August 2024 showed BNP less than 50, potassium 4.2, creatinine 0.92, magnesium 1.8, normal troponin and hemoglobin 13.9.  Zio patch August 2024 showed sinus rhythm, 2 episodes of ventricular tachycardia longest lasting 4 beats, brief (9 beats) runs of SVT, PACs and PVCs.  Seen at [facility] August 2024 with complaints of palpitations and this was felt to be secondary to caffeine, alcohol, dehydration and poor p.o. intake.  He now presents for second opinion.  Patient has had intermittent palpitations for several years described as a skip.  More recently he had episodes where he felt more sustained palpitations and more frequent described as a flutter.  No associated dyspnea, chest pain or syncope.  He denies dyspnea on exertion, orthopnea, PND, pedal edema or exertional chest pain.  Note there has been some improvement with magnesium supplementation.  Current Outpatient Medications  Medication Sig Dispense Refill   amoxicillin (AMOXIL) 500 MG tablet Take 500 mg by mouth. PRIOR TO DENTAL APPOINTMENTS     oxyCODONE-acetaminophen (PERCOCET) 7.5-325 MG tablet Take 1 tablet by mouth 3 (three) times daily as needed. 90 tablet 0   sildenafil (REVATIO) 20 MG tablet Take 20 mg by mouth daily as needed (ED).     triamcinolone cream (KENALOG) 0.1 % Apply 1 Application topically.     gabapentin (NEURONTIN) 100 MG capsule Take 1 capsule (100 mg total) by mouth at bedtime as needed. 30 capsule 0   methylPREDNISolone (MEDROL DOSEPAK) 4 MG TBPK tablet Take medication per standard packaging instructions. (Patient not taking: Reported on 06/17/2023) 21 tablet 0   oxyCODONE-acetaminophen (PERCOCET) 7.5-325 MG tablet Take 1 tablet by mouth 3 (three) times daily as needed. (Patient not  taking: Reported on 06/17/2023) 90 tablet 0   No current facility-administered medications for this visit.    Allergies  Allergen Reactions   Meloxicam Other (See Comments)    GI upset  Other Reaction(s): GI Intolerance   Tramadol Other (See Comments)    GI upset with nausea     Past Medical History:  Diagnosis Date   BCC (basal cell carcinoma of skin)    Hematospermia    hx of, remotely    Paroxysmal atrial fibrillation (HCC) 09/2017   saw cards   Pars defect of lumbar spine    PONV (postoperative nausea and vomiting)    Right knee DJD    Shingles 2014   face    Past Surgical History:  Procedure Laterality Date   ANKLE FRACTURE SURGERY Left 1996   plated   KNEE ARTHROSCOPY Left 1990   plica removed   KNEE ARTHROSCOPY Right    7 total   NASAL SEPTUM SURGERY     ORIF DISTAL RADIUS FRACTURE Right    age 38   REVISION TOTAL KNEE ARTHROPLASTY Right 08/19/2017   REVISION TOTAL SHOULDER TO REVERSE TOTAL SHOULDER Left 10/11/2021   Procedure: LEFT REVISION TOTAL SHOULDER TO REVERSE TOTAL SHOULDER, RIGHT SHOULDER STERIOD INJECTION;  Surgeon: Bjorn Pippin, MD;  Location: [facility];  Service: Orthopedics;  Laterality: Left;   SHOULDER ARTHROSCOPY W/ ROTATOR CUFF REPAIR Right 2011   bicep tendon and labrium repair   SHOULDER ARTHROSCOPY WITH ROTATOR CUFF REPAIR Left 2012   w/ labrium repair   TOTAL KNEE ARTHROPLASTY Right 10/12/2013   Procedure: RIGHT TOTAL KNEE ARTHROPLASTY;  Surgeon: Molly Maduro  Salley Slaughter, MD;  Location: [facility];  Service: Orthopedics;  Laterality: Right;   VASECTOMY      Social History   Socioeconomic History   Marital status: Married    Spouse name: Not on file   Number of children: 3   Years of education: Not on file   Highest education level: Not on file  Occupational History   Occupation: Merchant navy officer , has a farm   Occupation: Heritage manager at a [employer]  Tobacco Use   Smoking status: Never   Smokeless tobacco: Never  Vaping Use   Vaping status: Never  Used  Substance and Sexual Activity   Alcohol use: Yes    Comment: 3 drinks per day   Drug use: No   Sexual activity: Yes  Other Topics Concern   Not on file  Social History Narrative   Married, 3 children.   Wife has a history of breast cancer, recurrent, status post bilateral mastectomy   Wife also had thyroid cancer.   The patient has been very athletic all his life, playing basketball, softball, tennis etc.  As of 09/2018 is not very active due to multiple MSK issues.   Social Determinants of Health   Financial Resource Strain: Not on file  Food Insecurity: Not on file  Transportation Needs: Not on file  Physical Activity: Not on file  Stress: Not on file  Social Connections: Unknown (01/16/2022)   Received from [facility], [facility]   Social Network    Social Network: Not on file  Intimate Partner Violence: Unknown (12/08/2021)   Received from [facility], [facility]   HITS    Physically Hurt: Not on file    Insult or Talk Down To: Not on file    Threaten Physical Harm: Not on file    Scream or Curse: Not on file    Family History  Problem Relation Age of Onset   Cancer - Prostate Father    Cancer Father        prostate   Cancer Mother        lung, bones, liver   ADD / ADHD Sister    ADD / ADHD Brother    Diabetes Neg Hx    CAD Neg Hx     ROS: Chronic joint pain but no fevers or chills, productive cough, hemoptysis, dysphasia, odynophagia, melena, hematochezia, dysuria, hematuria, rash, seizure activity, orthopnea, PND, pedal edema, claudication. Remaining systems are negative.  Physical Exam:   Blood pressure 134/74, pulse 87, height 6' (1.829 m), weight 182 lb (82.6 kg), SpO2 98%.  General:  Well developed/well nourished in NAD Skin warm/dry Patient not depressed No peripheral clubbing Back-normal HEENT-normal/normal eyelids Neck supple/normal carotid upstroke bilaterally; no bruits; no JVD; no thyromegaly chest - CTA/ normal expansion CV -  RRR/normal S1 and S2; no murmurs, rubs or gallops;  PMI nondisplaced Abdomen -NT/ND, no HSM, no mass, + bowel sounds, no bruit 2+ femoral pulses, no bruits Ext-no edema, chords, 2+ DP Neuro-grossly nonfocal  EKG Interpretation Date/Time:  Monday June 17 2023 11:29:40 EDT Ventricular Rate:  87 PR Interval:  116 QRS Duration:  92 QT Interval:  360 QTC Calculation: 433 R Axis:   61  Text Interpretation: Sinus rhythm with frequent Premature ventricular complexes in a pattern of bigeminy Nonspecific T wave abnormality When compared with ECG of 10-Oct-2021 10:09, Premature ventricular complexes are now Present Non-specific change in ST segment in Inferior leads Non-specific change in ST segment in Anterolateral leads T wave inversion  now evident in Inferior leads Nonspecific T wave abnormality now evident in Anterolateral leads Confirmed by Olga Millers ([id number]) on 06/17/2023 11:31:11 AM    A/P  1 palpitations-patient did have palpitations with the monitor in place which showed PVCs, PACs and 4 beats nonsustained ventricular tachycardia.  Will arrange echocardiogram to assess LV function.  His symptoms have improved with magnesium.  I have also given him a prescription for Toprol 25 mg daily as needed.  Check TSH.  2 remote history of atrial fibrillation-patient had a remote history of atrial fibrillation 4 weeks after knee surgery and also after 3 shots of espresso and traveling.  He has had no recurrences.  Olga Millers, MD

## 2023-06-17 ENCOUNTER — Ambulatory Visit: Payer: BC Managed Care – PPO | Attending: Cardiology | Admitting: Cardiology

## 2023-06-17 ENCOUNTER — Encounter: Payer: Self-pay | Admitting: Cardiology

## 2023-06-17 VITALS — BP 134/74 | HR 87 | Ht 72.0 in | Wt 182.0 lb

## 2023-06-17 DIAGNOSIS — I4891 Unspecified atrial fibrillation: Secondary | ICD-10-CM

## 2023-06-17 DIAGNOSIS — R002 Palpitations: Secondary | ICD-10-CM | POA: Diagnosis not present

## 2023-06-17 MED ORDER — METOPROLOL SUCCINATE ER 25 MG PO TB24
25.0000 mg | ORAL_TABLET | Freq: Every day | ORAL | 11 refills | Status: DC | PRN
Start: 2023-06-17 — End: 2023-11-18

## 2023-06-17 NOTE — Patient Instructions (Signed)
Medication Instructions:  Metoprolol succ er 25mg  once daily as needed for palpitations  *If you need a refill on your cardiac medications before your next appointment, please call your pharmacy*     Testing/Procedures: Your physician has requested that you have an echocardiogram. Echocardiography is a painless test that uses sound waves to create images of your heart. It provides your doctor with information about the size and shape of your heart and how well your heart's chambers and valves are working. This procedure takes approximately one hour. There are no restrictions for this procedure. Please do NOT wear cologne, perfume, aftershave, or lotions (deodorant is allowed). Please arrive 15 minutes prior to your appointment time. [address]   Follow-Up: At [facility], you and your health needs are our priority.  As part of our continuing mission to provide you with exceptional heart care, we have created designated Provider Care Teams.  These Care Teams include your primary Cardiologist (physician) and Advanced Practice Providers (APPs -  Physician Assistants and Nurse Practitioners) who all work together to provide you with the care you need, when you need it.  We recommend signing up for the patient portal called "MyChart".  Sign up information is provided on this After Visit Summary.  MyChart is used to connect with patients for Virtual Visits (Telemedicine).  Patients are able to view lab/test results, encounter notes, upcoming appointments, etc.  Non-urgent messages can be sent to your provider as well.   To learn more about what you can do with MyChart, go to [email or web address].    Your next appointment:   6 month(s)  Provider:   Olga Millers

## 2023-06-18 LAB — TSH: TSH: 1.94 u[IU]/mL (ref 0.450–4.500)

## 2023-06-18 LAB — SPECIMEN STATUS REPORT

## 2023-06-20 ENCOUNTER — Ambulatory Visit: Payer: BC Managed Care – PPO | Admitting: Physical Therapy

## 2023-06-28 ENCOUNTER — Ambulatory Visit (HOSPITAL_BASED_OUTPATIENT_CLINIC_OR_DEPARTMENT_OTHER)
Admission: RE | Admit: 2023-06-28 | Discharge: 2023-06-28 | Disposition: A | Discharge status: Home / Self Care | Payer: BC Managed Care – PPO | Source: Ambulatory Visit | Attending: Cardiology | Admitting: Cardiology

## 2023-06-28 DIAGNOSIS — I4891 Unspecified atrial fibrillation: Secondary | ICD-10-CM | POA: Diagnosis not present

## 2023-06-28 LAB — ECHOCARDIOGRAM COMPLETE
AR max vel: 2.7 cm2
AV Area VTI: 2.23 cm2
AV Area mean vel: 2.4 cm2
AV Mean grad: 6.5 mm[Hg]
AV Peak grad: 9.6 mm[Hg]
AV Vena cont: 0.3 cm
Ao pk vel: 1.55 m/s
Area-P 1/2: 2.91 cm2
Calc EF: 60.4 %
MV M vel: 5.77 m/s
MV Peak grad: 133.2 mm[Hg]
MV Vena cont: 0.2 cm
P 1/2 time: 1323 ms
Radius: 0.4 cm
S' Lateral: 3.6 cm
Single Plane A2C EF: 62.6 %
Single Plane A4C EF: 56.7 %

## 2023-07-12 DIAGNOSIS — M19011 Primary osteoarthritis, right shoulder: Secondary | ICD-10-CM | POA: Diagnosis not present

## 2023-07-12 DIAGNOSIS — M25512 Pain in left shoulder: Secondary | ICD-10-CM | POA: Diagnosis not present

## 2023-07-12 DIAGNOSIS — G8929 Other chronic pain: Secondary | ICD-10-CM | POA: Diagnosis not present

## 2023-07-12 DIAGNOSIS — M19012 Primary osteoarthritis, left shoulder: Secondary | ICD-10-CM | POA: Diagnosis not present

## 2023-07-13 ENCOUNTER — Encounter: Payer: Self-pay | Admitting: Physical Medicine and Rehabilitation

## 2023-07-13 DIAGNOSIS — Z029 Encounter for administrative examinations, unspecified: Secondary | ICD-10-CM

## 2023-07-13 DIAGNOSIS — M7918 Myalgia, other site: Secondary | ICD-10-CM

## 2023-07-13 DIAGNOSIS — G8929 Other chronic pain: Secondary | ICD-10-CM

## 2023-07-13 DIAGNOSIS — G894 Chronic pain syndrome: Secondary | ICD-10-CM

## 2023-07-13 DIAGNOSIS — Z5181 Encounter for therapeutic drug level monitoring: Secondary | ICD-10-CM

## 2023-07-15 MED ORDER — OXYCODONE-ACETAMINOPHEN 7.5-325 MG PO TABS: ORAL_TABLET | ORAL | 0 refills | Status: DC

## 2023-07-24 DIAGNOSIS — D225 Melanocytic nevi of trunk: Secondary | ICD-10-CM | POA: Diagnosis not present

## 2023-07-24 DIAGNOSIS — L821 Other seborrheic keratosis: Secondary | ICD-10-CM | POA: Diagnosis not present

## 2023-07-24 DIAGNOSIS — Z85828 Personal history of other malignant neoplasm of skin: Secondary | ICD-10-CM | POA: Diagnosis not present

## 2023-07-24 DIAGNOSIS — L570 Actinic keratosis: Secondary | ICD-10-CM | POA: Diagnosis not present

## 2023-07-24 DIAGNOSIS — L111 Transient acantholytic dermatosis [Grover]: Secondary | ICD-10-CM | POA: Diagnosis not present

## 2023-08-13 ENCOUNTER — Encounter: Payer: BC Managed Care – PPO | Attending: Registered Nurse | Admitting: Registered Nurse

## 2023-08-13 ENCOUNTER — Encounter: Payer: Self-pay | Admitting: Registered Nurse

## 2023-08-13 VITALS — BP 149/86 | HR 65 | Ht 72.0 in | Wt 184.0 lb

## 2023-08-13 DIAGNOSIS — Z5181 Encounter for therapeutic drug level monitoring: Secondary | ICD-10-CM | POA: Diagnosis not present

## 2023-08-13 DIAGNOSIS — M7918 Myalgia, other site: Secondary | ICD-10-CM | POA: Insufficient documentation

## 2023-08-13 DIAGNOSIS — G8929 Other chronic pain: Secondary | ICD-10-CM | POA: Diagnosis not present

## 2023-08-13 DIAGNOSIS — M25512 Pain in left shoulder: Secondary | ICD-10-CM | POA: Diagnosis not present

## 2023-08-13 DIAGNOSIS — Z79899 Other long term (current) drug therapy: Secondary | ICD-10-CM | POA: Insufficient documentation

## 2023-08-13 DIAGNOSIS — G894 Chronic pain syndrome: Secondary | ICD-10-CM | POA: Diagnosis not present

## 2023-08-13 DIAGNOSIS — M25511 Pain in right shoulder: Secondary | ICD-10-CM | POA: Diagnosis not present

## 2023-08-13 MED ORDER — OXYCODONE-ACETAMINOPHEN 7.5-325 MG PO TABS: 1.0000 | ORAL_TABLET | Freq: Three times a day (TID) | ORAL | 0 refills | Status: DC | PRN

## 2023-08-13 MED ORDER — OXYCODONE-ACETAMINOPHEN 7.5-325 MG PO TABS
1.0000 | ORAL_TABLET | Freq: Three times a day (TID) | ORAL | 0 refills | Status: DC | PRN
Start: 2023-08-13 — End: 2023-08-13

## 2023-08-13 NOTE — Progress Notes (Signed)
Subjective:    Patient ID: Bruce Cobb, male    DOB: Apr 10, 1966, 57 y.o.   MRN: [id number]  HPI: Bruce Cobb is a 57 y.o. male who returns for follow up appointment for chronic pain and medication refill. She states her pain is located in his bilateral shoulders R>L, lower back and right knee pain.  He also reports he is only receiving 4 hours of relief with his current medication regimen. This was discussed with Dr Shearon Stalls, we will resume  his Oxycodone every 8 hours as needed for pain, he verbalizes understanding. He is scheduled for Shoulder Ablation on 09/18/2023.  He rates his pain 7. His current exercise regime is walking and performing stretching exercises.  Mr. Bufkin Morphine equivalent is 28.13 MME.   UDS ordered today.    Pain Inventory Average Pain 7 Pain Right Now 7 My pain is constant, dull, and aching  In the last 24 hours, has pain interfered with the following? General activity 8 Relation with others 7 Enjoyment of life 7 What TIME of day is your pain at its worst? morning  and night Sleep (in general) Fair  Pain is worse with: bending, sitting, and standing Pain improves with: rest, heat/ice, and medication Relief from Meds: 6  Family History  Problem Relation Age of Onset   Cancer - Prostate Father    Cancer Father        prostate   Cancer Mother        lung, bones, liver   ADD / ADHD Sister    ADD / ADHD Brother    Diabetes Neg Hx    CAD Neg Hx    Social History   Socioeconomic History   Marital status: Married    Spouse name: Not on file   Number of children: 3   Years of education: Not on file   Highest education level: Not on file  Occupational History   Occupation: Merchant navy officer , has a farm   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   Wife has a history of breast cancer, recurrent, status post bilateral mastectomy   Wife also had thyroid cancer.   The patient has been very athletic all his life, playing basketball, softball, tennis etc.  As of 09/2018 is not very active due to multiple MSK issues.   Social Determinants of Health   Financial Resource Strain: Not on file  Food Insecurity: Not on file  Transportation Needs: Not on file  Physical Activity: Not on file  Stress: Not on file  Social Connections: Unknown (01/16/2022)   Received from [facility], [facility]   Social Network    Social Network: Not on file   Past Surgical History:  Procedure Laterality Date   ANKLE FRACTURE SURGERY Left 1996   plated   KNEE ARTHROSCOPY Left 1990   plica removed   KNEE ARTHROSCOPY Right    7 total   NASAL SEPTUM SURGERY     ORIF DISTAL RADIUS FRACTURE Right    age 66   REVISION TOTAL KNEE ARTHROPLASTY Right 08/19/2017   REVISION TOTAL SHOULDER  TO REVERSE TOTAL SHOULDER Left 10/11/2021   Procedure: LEFT REVISION TOTAL SHOULDER TO REVERSE TOTAL SHOULDER, RIGHT SHOULDER STERIOD INJECTION;  Surgeon: Bjorn Pippin, MD;  Location: [facility];  Service: Orthopedics;  Laterality: Left;   SHOULDER ARTHROSCOPY W/ ROTATOR CUFF REPAIR Right 2011   bicep tendon and labrium repair   SHOULDER ARTHROSCOPY WITH ROTATOR CUFF REPAIR Left 2012   w/ labrium repair   TOTAL KNEE ARTHROPLASTY Right 10/12/2013   Procedure: RIGHT TOTAL KNEE ARTHROPLASTY;  Surgeon: Nilda Simmer, MD;  Location: [facility];  Service: Orthopedics;  Laterality: Right;   VASECTOMY     Past Surgical History:  Procedure Laterality Date   ANKLE FRACTURE SURGERY Left 1996   plated   KNEE ARTHROSCOPY Left 1990   plica removed   KNEE ARTHROSCOPY Right    7 total   NASAL SEPTUM SURGERY     ORIF DISTAL RADIUS FRACTURE Right    age 36   REVISION TOTAL KNEE ARTHROPLASTY  Right 08/19/2017   REVISION TOTAL SHOULDER TO REVERSE TOTAL SHOULDER Left 10/11/2021   Procedure: LEFT REVISION TOTAL SHOULDER TO REVERSE TOTAL SHOULDER, RIGHT SHOULDER STERIOD INJECTION;  Surgeon: Bjorn Pippin, MD;  Location: [facility];  Service: Orthopedics;  Laterality: Left;   SHOULDER ARTHROSCOPY W/ ROTATOR CUFF REPAIR Right 2011   bicep tendon and labrium repair   SHOULDER ARTHROSCOPY WITH ROTATOR CUFF REPAIR Left 2012   w/ labrium repair   TOTAL KNEE ARTHROPLASTY Right 10/12/2013   Procedure: RIGHT TOTAL KNEE ARTHROPLASTY;  Surgeon: Nilda Simmer, MD;  Location: [facility];  Service: Orthopedics;  Laterality: Right;   VASECTOMY     Past Medical History:  Diagnosis Date   BCC (basal cell carcinoma of skin)    Hematospermia    hx of, remotely    Paroxysmal atrial fibrillation (HCC) 09/2017   saw cards   Pars defect of lumbar spine    PONV (postoperative nausea and vomiting)    Right knee DJD    Shingles 2014   face   BP (!) 166/96   Pulse 65   Ht 6' (1.829 m)   Wt 184 lb (83.5 kg)   SpO2 99%   BMI 24.95 kg/m   Opioid Risk Score:   Fall Risk Score:  `1  Depression screen [facility] 2/9     04/15/2023    2:32 PM 01/21/2023    3:08 PM 09/24/2018   12:21 PM  Depression screen PHQ 2/9  Decreased Interest 0 2 0  Down, Depressed, Hopeless 0 3 0  PHQ - 2 Score 0 5 0  Altered sleeping  3 1  Tired, decreased energy  1 0  Change in appetite  2 0  Feeling bad or failure about yourself   0 0  Trouble concentrating  0 0  Moving slowly or fidgety/restless  0 0  Suicidal thoughts  3 0  PHQ-9 Score  14 1      Review of Systems  Musculoskeletal:        Bilateral shoulder pain Right knee pain  All other systems reviewed and are negative.     Objective:   Physical Exam Vitals and nursing note reviewed.  Constitutional:      Appearance: Normal appearance.  Cardiovascular:     Rate and Rhythm: Normal rate and regular rhythm.     Pulses: Normal pulses.     Heart sounds: Normal  heart sounds.  Pulmonary:     Effort: Pulmonary effort is normal.  Breath sounds: Normal breath sounds.  Musculoskeletal:     Comments: Normal Muscle Bulk and Muscle Testing Reveals:  Upper Extremities: Decreased ROM 90 Degrees  and Muscle Strength 5/5 Bilateral AC Joint Tenderness Lower Extremities: Full ROM and Muscle Strength 5/5 Arises from Table with Ease Narrow Based  Gait     Skin:    General: Skin is warm and dry.  Neurological:     Mental Status: He is alert and oriented to person, place, and time.  Psychiatric:        Mood and Affect: Mood normal.        Behavior: Behavior normal.         Assessment & Plan:  Chronic Pain of Bilateral Shoulders: Scheduled for Shoulder Ablation on 09/17/2022.  Myofascial Pain: Continue HEP as Tolerated. Continue to Monitor.  Chronic Pain Syndrome: Refilled: Oxycodone 7.5/325 mg one tablet every 8 hours as needed for pain #90. We will continue the opioid monitoring program, this consists of regular clinic visits, examinations, urine drug screen, pill counts as well as use of [place] Controlled Substance Reporting system. A 12 month History has been reviewed on the [place] Controlled Substance Reporting System on 08/13/2023   F/U with Dr Shearon Stalls in 2 months

## 2023-08-15 DIAGNOSIS — M25561 Pain in right knee: Secondary | ICD-10-CM | POA: Diagnosis not present

## 2023-08-17 LAB — TOXASSURE SELECT,+ANTIDEPR,UR

## 2023-08-19 ENCOUNTER — Telehealth: Payer: Self-pay | Admitting: Registered Nurse

## 2023-08-20 ENCOUNTER — Telehealth: Payer: Self-pay | Admitting: Registered Nurse

## 2023-08-20 NOTE — Telephone Encounter (Signed)
Call placed to Mr. Isobe  UDS results: + ETOH, +THC. No answer left message to return the call.

## 2023-08-21 NOTE — Telephone Encounter (Signed)
Return Bruce Cobb call,  We reviewed his UDS results.  He admits to using CBD products and not smoking marijuana.  He was instructed not to use CBD products and no drinking ETOH, he verbalizes understanding.

## 2023-08-21 NOTE — Telephone Encounter (Signed)
Patient returning your call.

## 2023-09-18 DIAGNOSIS — M25512 Pain in left shoulder: Secondary | ICD-10-CM | POA: Diagnosis not present

## 2023-09-18 DIAGNOSIS — M19011 Primary osteoarthritis, right shoulder: Secondary | ICD-10-CM | POA: Diagnosis not present

## 2023-09-18 DIAGNOSIS — M19012 Primary osteoarthritis, left shoulder: Secondary | ICD-10-CM | POA: Diagnosis not present

## 2023-09-18 DIAGNOSIS — G8929 Other chronic pain: Secondary | ICD-10-CM | POA: Diagnosis not present

## 2023-10-01 NOTE — Progress Notes (Signed)
Subjective:    Patient ID: Bruce Cobb, male    DOB: 1966-06-14, 58 y.o.   MRN: [id number]  HPI  Bruce Cobb is a 58 y.o. year old male  who  has a past medical history of BCC (basal cell carcinoma of skin), Hematospermia, Paroxysmal atrial fibrillation (HCC) (09/2017), Pars defect of lumbar spine, PONV (postoperative nausea and vomiting), Right knee DJD, and Shingles (2014).     They are presenting to PM&R clinic for follow up related to bilateral shoulder pain . Plan from last visit: Chronic pain of both shoulders Encounter for therapeutic drug monitoring Encounter for opiate analgesic use agreement   Patient wishes to pursue peripheral nerve stimulator with [facility]; has been advised they need to follow with Dr. Jerrilyn Cairo at [facility] for pain management prior to starting this. Referral sent today.   Will provide 3 months of prescription to bridge to chronic pain management at [facility]. OK to take 1.5 - 2 tabs at once as long as he stays within total pills daily.    Follow up only as needed.    Wishes to trial gabapentin to help in weaning Percocet; has had sedation with gabapentin in the past. Will give temporary script for 100 mg #30 tabs to use at nighttime as needed only. Will call if tolerated or more needed.    Decreased shoulder range of motion Send back to PT for one session for development of HEP for stretching, given reduced ROM today   Has had imaging in March with Orthopedics; Discussed risk for adhesive capsulitis with loss of internal rotation, will defer repeat MRI at this time and trial ROM exercises as above.     Myofascial pain   Advised can use TENs unit over neck and trapezius; would avoid placing stimulators directly over surgical hardware     Interval Hx:  - Therapies: His therapist notes his posture is poor, and he notes positional changes like getting up and twisting cause the worst pain.    - Follow ups: Bilateral Lateral Pectoral Nerve ablation done at [facility]  09/18/23 - " I was very enthused after the nerve blocks..I dont feel like it's nerve related." He notes "the first three times failed"when they tried to do the ablation...first two times he got swelling and pain down his right arm.   Saw his orthopedic doctor for bone spurs in his right knee s/p replacement, they don't want to repeat surgery so "I guess I just have to deal with it".    - Falls: none   - DME: none   - Medications: Percocet 7.5 mg TID PRN filled 09/10/23; encouraged to wean last visit  - takes one in the morning and one around noon. He says he waked up with pain but this can get him through the morning and another to get home through the afternoon. He does not use it at nighttime.   Started celebrex a week ago; may have been noticing  States best thing was steroid taper in October   - Other concerns: Tox screen 08/17/23 + alcohol and THC.  Notes he did tweek his neck   Pain Inventory Average Pain 7 Pain Right Now 7 My pain is constant, aching, and throb  In the last 24 hours, has pain interfered with the following? General activity  moderate Relation with others  little Enjoyment of life  a lot What TIME of day is your pain at its worst? morning  Sleep (in general) Poor  Pain is  worse with: walking, inactivity, standing, and some activites Pain improves with: rest, heat/ice, medication, TENS, and heat, massage, ice Relief from Meds: 4  Family History  Problem Relation Age of Onset   Cancer - Prostate Father    Cancer Father        prostate   Cancer Mother        lung, bones, liver   ADD / ADHD Sister    ADD / ADHD Brother    Diabetes Neg Hx    CAD Neg Hx    Social History   Socioeconomic History   Marital status: Married    Spouse name: Not on file   Number of children: 3   Years of education: Not on file   Highest education level: Not on file  Occupational History   Occupation: Merchant navy officer , has a farm   Occupation: Heritage manager at a [employer]   Tobacco Use   Smoking status: Never   Smokeless tobacco: Never  Vaping Use   Vaping status: Never Used  Substance and Sexual Activity   Alcohol use: Yes    Comment: 3 drinks per day   Drug use: No   Sexual activity: Yes  Other Topics Concern   Not on file  Social History Narrative   Married, 3 children.   Wife has a history of breast cancer, recurrent, status post bilateral mastectomy   Wife also had thyroid cancer.   The patient has been very athletic all his life, playing basketball, softball, tennis etc.  As of 09/2018 is not very active due to multiple MSK issues.   Social Drivers of Corporate investment banker Strain: Not on file  Food Insecurity: Not on file  Transportation Needs: Not on file  Physical Activity: Not on file  Stress: Not on file  Social Connections: Unknown (01/16/2022)   Received from [facility]   Social Network    Social Network: Not on file   Past Surgical History:  Procedure Laterality Date   ANKLE FRACTURE SURGERY Left 1996   plated   KNEE ARTHROSCOPY Left 1990   plica removed   KNEE ARTHROSCOPY Right    7 total   NASAL SEPTUM SURGERY     ORIF DISTAL RADIUS FRACTURE Right    age 77   REVISION TOTAL KNEE ARTHROPLASTY Right 08/19/2017   REVISION TOTAL SHOULDER TO REVERSE TOTAL SHOULDER Left 10/11/2021   Procedure: LEFT REVISION TOTAL SHOULDER TO REVERSE TOTAL SHOULDER, RIGHT SHOULDER STERIOD INJECTION;  Surgeon: Bjorn Pippin, MD;  Location: [facility];  Service: Orthopedics;  Laterality: Left;   SHOULDER ARTHROSCOPY W/ ROTATOR CUFF REPAIR Right 2011   bicep tendon and labrium repair   SHOULDER ARTHROSCOPY WITH ROTATOR CUFF REPAIR Left 2012   w/ labrium repair   TOTAL KNEE ARTHROPLASTY Right 10/12/2013   Procedure: RIGHT TOTAL KNEE ARTHROPLASTY;  Surgeon: Nilda Simmer, MD;  Location: [facility] OR;  Service: Orthopedics;  Laterality: Right;   VASECTOMY     Past Surgical History:  Procedure Laterality Date   ANKLE FRACTURE SURGERY Left  1996   plated   KNEE ARTHROSCOPY Left 1990   plica removed   KNEE ARTHROSCOPY Right    7 total   NASAL SEPTUM SURGERY     ORIF DISTAL RADIUS FRACTURE Right    age 1   REVISION TOTAL KNEE ARTHROPLASTY Right 08/19/2017   REVISION TOTAL SHOULDER TO REVERSE TOTAL SHOULDER Left 10/11/2021   Procedure: LEFT REVISION TOTAL SHOULDER TO REVERSE TOTAL SHOULDER, RIGHT  SHOULDER STERIOD INJECTION;  Surgeon: Bjorn Pippin, MD;  Location: [facility];  Service: Orthopedics;  Laterality: Left;   SHOULDER ARTHROSCOPY W/ ROTATOR CUFF REPAIR Right 2011   bicep tendon and labrium repair   SHOULDER ARTHROSCOPY WITH ROTATOR CUFF REPAIR Left 2012   w/ labrium repair   TOTAL KNEE ARTHROPLASTY Right 10/12/2013   Procedure: RIGHT TOTAL KNEE ARTHROPLASTY;  Surgeon: Nilda Simmer, MD;  Location: [facility];  Service: Orthopedics;  Laterality: Right;   VASECTOMY     Past Medical History:  Diagnosis Date   BCC (basal cell carcinoma of skin)    Hematospermia    hx of, remotely    Paroxysmal atrial fibrillation (HCC) 09/2017   saw cards   Pars defect of lumbar spine    PONV (postoperative nausea and vomiting)    Right knee DJD    Shingles 2014   face   There were no vitals taken for this visit.  Opioid Risk Score:   Fall Risk Score:  `1  Depression screen [facility] [place] 2/9     04/15/2023    2:32 PM 01/21/2023    3:08 PM 09/24/2018   12:21 PM  Depression screen PHQ 2/9  Decreased Interest 0 2 0  Down, Depressed, Hopeless 0 3 0  PHQ - 2 Score 0 5 0  Altered sleeping  3 1  Tired, decreased energy  1 0  Change in appetite  2 0  Feeling bad or failure about yourself   0 0  Trouble concentrating  0 0  Moving slowly or fidgety/restless  0 0  Suicidal thoughts  3 0  PHQ-9 Score  14 1    Review of Systems  Musculoskeletal:  Positive for back pain and neck pain.       Pain in both shoulders, right knee  All other systems reviewed and are negative.      Objective:   Physical Exam   PE: Constitution: Appropriate  appearance for age. No apparent distress   Resp: No respiratory distress. No accessory muscle usage. on RA and CTAB Cardio: Well perfused appearance. No peripheral edema. Abdomen: Nondistended. Nontender.   Psych: Appropriate mood and affect. Neuro: AAOx4. No apparent cognitive deficits. No apparent sensory deficits.    MSK: Strength 5/5 intact in all planes of motion, + pain with internal rotation bilaterally Bilateral shoulder:  R Palpable AC joint separation, mild AROM limited in bilateral shoulder abduction, extremely limited in R>L shoulder internal rotation Pain with TTP over left coracoid, biceps tendon Yergason's, speeds negative b/l       Assessment & Plan:   JORI THRALL is a 58 y.o. year old male  who  has a past medical history of BCC (basal cell carcinoma of skin), Hematospermia, Paroxysmal atrial fibrillation (HCC) (09/2017), Pars defect of lumbar spine, PONV (postoperative nausea and vomiting), Right knee DJD, and Shingles (2014).   They are presenting to PM&R clinic as a follow up for bilateral shoulder pain.   Chronic pain syndrome Encounter for therapeutic drug monitoring Encounter for long-term current use of medication  I have refilled your Percocet 7.5 mg for 2 additional months; I have reduced this down to twice daily, since this is as frequently as you are using it.  I prescribed a short-term steroid Dosepak for your trip.  Please start medication 1 to 2 days before leaving.  I have advised not to take more than 2 or 3 of these Dosepaks over a 78-month period.  Follow-up with me in 2  months.    Chronic pain of both shoulders (s/p bilateral replacements, with L revision reverse SA) -     DG Shoulder Left; Future -     DG Shoulder Right; Future  I am getting repeat x-rays of your bilateral shoulders to look for any worsening arthritis or other changes since your surgeries in 2023.  I am going to be reaching out to your orthopedic surgeon Dr. Ramond Marrow  regarding ongoing challenges with pain and range of motion in both of your shoulders, limited benefit from recent bilateral lateral pectoral nerve RFAs by Dr. Jerrilyn Cairo with [facility], and if there would be any concerns with resuming intra-articular injections.  If your surgeon is in agreement and x-rays show nothing concerning, we will plan for this next visit.  Essential hypertension Follow-up with your primary care doctor regarding ongoing hypertension, and with the need to be on a standing medication for this.  Chronic pain of right knee Continue Celebrex for at least 2 weeks to see if anti-inflammatory effects help your right knee pain.  If you start to get any gastrointestinal upset, I would stop this medication first.   Other orders -     methylPREDNISolone; Take as instructed on packaging  Dispense: 1 each; Refill: 0

## 2023-10-02 ENCOUNTER — Encounter: Payer: BC Managed Care – PPO | Attending: Registered Nurse | Admitting: Physical Medicine and Rehabilitation

## 2023-10-02 ENCOUNTER — Encounter: Payer: Self-pay | Admitting: Physical Medicine and Rehabilitation

## 2023-10-02 VITALS — BP 160/88 | HR 61 | Ht 72.0 in | Wt 184.0 lb

## 2023-10-02 DIAGNOSIS — G8929 Other chronic pain: Secondary | ICD-10-CM | POA: Insufficient documentation

## 2023-10-02 DIAGNOSIS — M25512 Pain in left shoulder: Secondary | ICD-10-CM | POA: Diagnosis not present

## 2023-10-02 DIAGNOSIS — Z79899 Other long term (current) drug therapy: Secondary | ICD-10-CM | POA: Insufficient documentation

## 2023-10-02 DIAGNOSIS — G894 Chronic pain syndrome: Secondary | ICD-10-CM | POA: Insufficient documentation

## 2023-10-02 DIAGNOSIS — M25561 Pain in right knee: Secondary | ICD-10-CM | POA: Diagnosis not present

## 2023-10-02 DIAGNOSIS — I10 Essential (primary) hypertension: Secondary | ICD-10-CM | POA: Diagnosis not present

## 2023-10-02 DIAGNOSIS — M25511 Pain in right shoulder: Secondary | ICD-10-CM | POA: Insufficient documentation

## 2023-10-02 DIAGNOSIS — Z5181 Encounter for therapeutic drug level monitoring: Secondary | ICD-10-CM | POA: Insufficient documentation

## 2023-10-02 MED ORDER — OXYCODONE-ACETAMINOPHEN 7.5-325 MG PO TABS: 1.0000 | ORAL_TABLET | Freq: Two times a day (BID) | ORAL | 0 refills | Status: DC | PRN

## 2023-10-02 MED ORDER — OXYCODONE-ACETAMINOPHEN 7.5-325 MG PO TABS: 1.0000 | ORAL_TABLET | Freq: Two times a day (BID) | ORAL | 0 refills | Status: AC | PRN

## 2023-10-02 MED ORDER — METHYLPREDNISOLONE 4 MG PO TBPK: ORAL_TABLET | ORAL | 0 refills | Status: DC

## 2023-10-02 NOTE — Patient Instructions (Addendum)
Continue Celebrex for at least 2 weeks to see if anti-inflammatory effects help your right knee pain.  If you start to get any gastrointestinal upset, I would stop this medication first.  I am getting repeat x-rays of your bilateral shoulders to look for any worsening arthritis or other changes since your surgeries in 2023.  I have refilled your Percocet 7.5 mg for 2 additional months; I have reduced this down to twice daily, since this is as frequently as you are using it.  I prescribed a short-term steroid Dosepak for your trip.  Please start medication 1 to 2 days before leaving.  I have advised not to take more than 2 or 3 of these Dosepaks over a 73-month period.  I am going to be reaching out to your orthopedic surgeon regarding ongoing challenges with pain and range of motion in both of your shoulders, limited benefit from recent nerve ablation, and if there would be any concerns with resuming intra-articular injections.  If your surgeon is in agreement and x-rays show nothing concerning, we will plan for this next visit.  Follow-up with me in 2 months.  Follow-up with your primary care doctor regarding ongoing hypertension, and with the need to be on a standing medication for this.

## 2023-11-16 NOTE — Progress Notes (Unsigned)
 Cardiology Office Note    Date:  11/19/2023  ID:  Bruce Cobb, DOB 19-Jan-1966, MRN [id number] PCP:  Pcp, No  Cardiologist:  Olga Millers, MD  Electrophysiologist:  None   Chief Complaint: Follow up for palpitations   History of Present Illness: .    Bruce Cobb is a 58 y.o. male with visit-pertinent history of remote atrial fibrillation per report and frequent PVCs on EKG in 11/2023.  Patient self-referred and was seen by Dr. Jens Som on 06/17/2023 for evaluation of palpitations.  Patient has a distant history of atrial fibrillation by report.  He was previously evaluated by [facility] in 2024 with complaints of palpitations, felt to be secondary to caffeine, alcohol, dehydration and poor p.o. intake.  Lab work at that time showed BNP less than 50, potassium 4.2, creatinine 0.92, magnesium 1.8, normal troponin and hemoglobin 13.9.  Zio patch in August 2024 showed sinus rhythm, 2 episodes of ventricular tachycardia longest lasting 4 beats, brief 9 beat runs of SVT with PACs and PVCs.  Patient was evaluate by Dr. Jens Som in 06/2023 for a second opinion.  Reported intermittent palpitations for several years described as a skip however more recently and had episodes where he felt sustained palpitations and more frequent described as a flutter.  Denied dyspnea, chest pain or syncope.  Noted some improvement with magnesium supplementation.  Echocardiogram was recommended as well as continuation with magnesium and he was also started on Toprol 25 mg daily as needed.  Echocardiogram on 06/28/2023 indicated LVEF of 60 to 65%, normal function, no RWMA, diastolic parameters were normal, RV systolic function and size was normal, there is mild aortic valve regurgitation.  Today he presents for follow-up.  He reports that he has overall been doing well.  He reports that he continues to have episodes of palpitations, specifically when stressed or when agitated.  Notes that he had episodes of palpitations while  sitting in the waiting room and while his EKG was being taken today, his EKG indicates frequent PVCs in a pattern of bigeminy.  Patient reports that episodes are typically short-lived lasting only a few minutes.  He has taken metoprolol succinate twice, notices improvement in palpitations with this.  He overall denies any associated symptoms.  Denies chest pain, shortness of breath, lower extremity edema, orthopnea or PND.  Patient reports that he recently came back from vacation, while on vacation he did not have any episodes of palpitations except for on the plane when there was high turbulence and he became stressed.  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  He reports that he has regularly been taking magnesium oxide as a supplement and has been drinking Gatorlite for an electrolyte supplement.  He noted that a few weeks ago he had elevated blood pressures, these improved after decreasing the amount of Gatorlite he was consuming.  He is now drinking a single pack a day and has improved blood pressures.   ROS: .   Today he denies chest pain, shortness of breath, lower extremity edema, fatigue, melena, hematuria, hemoptysis, diaphoresis, weakness, presyncope, syncope, orthopnea, and PND.  All other systems are reviewed and otherwise negative. Studies Reviewed: Marland Kitchen    EKG:  EKG is ordered today, personally reviewed, demonstrating  EKG Interpretation Date/Time:  Monday November 18 2023 14:55:58 EDT Ventricular Rate:  71 PR Interval:  138 QRS Duration:  82 QT Interval:  382 QTC Calculation: 415 R Axis:  17  Text Interpretation: Sinus rhythm with frequent Premature ventricular complexes in a pattern of bigeminy When compared with ECG of 17-Jun-2023 11:29, Nonspecific T wave abnormality has replaced inverted T waves in Inferior leads Nonspecific T wave abnormality, improved in Lateral leads Confirmed by Reather Littler [phone]) on 11/19/2023  4:28:10 PM   CV Studies: Cardiac studies reviewed are outlined and summarized above. Otherwise please see EMR for full report. Cardiac Studies & Procedures   ______________________________________________________________________________________________     ECHOCARDIOGRAM  ECHOCARDIOGRAM COMPLETE 06/28/2023  Narrative ECHOCARDIOGRAM REPORT    Patient Name:   Bruce Cobb Date of Exam: 06/28/2023 Medical Rec #:  [id number]    Height:       72.0 in Accession #:    [id number]   Weight:       182.0 lb Date of Birth:  [birth year]     BSA:          2.047 m Patient Age:    57 years     BP:           134/74 mmHg Patient Gender: M            HR:           72 bpm. Exam Location:  [place]  Procedure: 2D Echo, Cardiac Doppler and Color Doppler  Indications:    I48.91* Unspeicified atrial fibrillation  History:        Patient has no prior history of Echocardiogram examinations. Arrythmias:PVC, Atrial Fibrillation and Palpitations; Risk Factors:Hypertension and Non-Smoker.  Sonographer:    Jake Seats RDMS, RVT, RDCS Referring Phys: [id number] BRIAN S CRENSHAW  IMPRESSIONS   1. Frequent PVCs and bigeminy. Left ventricular ejection fraction, by estimation, is 60 to 65%. Left ventricular ejection fraction by 2D MOD biplane is 60.4 %. The left ventricle has normal function. The left ventricle has no regional wall motion abnormalities. Left ventricular diastolic parameters were normal. 2. Right ventricular systolic function is normal. The right ventricular size is normal. 3. The mitral valve is normal in structure. Mild mitral valve regurgitation. No evidence of mitral stenosis. 4. The aortic valve is tricuspid. Aortic valve regurgitation is mild. No aortic stenosis is present. 5. The inferior vena cava is normal in size with greater than 50% respiratory variability, suggesting right atrial pressure of 3 mmHg.  FINDINGS Left Ventricle: Frequent PVCs and bigeminy. Left ventricular ejection  fraction, by estimation, is 60 to 65%. Left ventricular ejection fraction by 2D MOD biplane is 60.4 %. The left ventricle has normal function. The left ventricle has no regional wall motion abnormalities. The left ventricular internal cavity size was normal in size. There is no left ventricular hypertrophy. Left ventricular diastolic parameters were normal. Normal left ventricular filling pressure.  Right Ventricle: The right ventricular size is normal. No increase in right ventricular wall thickness. Right ventricular systolic function is normal.  Left [facility]: Left atrial size was normal in size.  Right [facility]: Right atrial size was normal in size.  Pericardium: There is no evidence of pericardial effusion.  Mitral Valve: The mitral valve is normal in structure. Mild mitral valve regurgitation. No evidence of mitral valve stenosis.  Tricuspid Valve: The tricuspid valve is normal in structure. Tricuspid valve regurgitation is mild . No evidence of tricuspid stenosis.  Aortic Valve: The aortic valve is tricuspid. Aortic valve regurgitation is mild. Aortic regurgitation PHT measures 1323 msec. No aortic stenosis is present. Aortic valve mean gradient measures 6.5 mmHg. Aortic valve peak gradient measures 9.6 mmHg. Aortic valve  area, by VTI measures 2.23 cm.  Pulmonic Valve: The pulmonic valve was normal in structure. Pulmonic valve regurgitation is trivial. No evidence of pulmonic stenosis.  Aorta: The aortic arch was not well visualized and the aortic root and ascending aorta are structurally normal, with no evidence of dilitation.  Venous: A normal flow pattern is recorded from the right upper pulmonary vein. The inferior vena cava is normal in size with greater than 50% respiratory variability, suggesting right atrial pressure of 3 mmHg.  IAS/Shunts: No atrial level shunt detected by color flow Doppler.   LEFT VENTRICLE PLAX 2D                        Biplane EF (MOD) LVIDd:          5.50 cm         LV Biplane EF:   Left LVIDs:         3.60 cm                          ventricular LV PW:         1.10 cm                          ejection LV IVS:        1.00 cm                          fraction by LVOT diam:     2.00 cm                          2D MOD LV SV:         84                               biplane is LV SV Index:   41                               60.4 %. LVOT Area:     3.14 cm Diastology LV e' medial:    10.10 cm/s LV Volumes (MOD)               LV E/e' medial:  7.3 LV vol d, MOD    122.0 ml      LV e' lateral:   14.10 cm/s A2C:                           LV E/e' lateral: 5.3 LV vol d, MOD    103.4 ml A4C: LV vol s, MOD    45.7 ml A2C: LV vol s, MOD    44.8 ml A4C: LV SV MOD A2C:   76.4 ml LV SV MOD A4C:   103.4 ml LV SV MOD BP:    68.8 ml  RIGHT VENTRICLE RV S prime:     14.10 cm/s TAPSE (M-mode): 3.0 cm  LEFT ATRIUM             Index        RIGHT [facility]           Index LA diam:        4.10 cm 2.00 cm/m   RA Area:  18.50 cm LA Vol (A2C):   84.6 ml 41.33 ml/m  RA Volume:   54.20 ml  26.48 ml/m LA Vol (A4C):   44.3 ml 21.64 ml/m LA Biplane Vol: 65.5 ml 32.00 ml/m AORTIC VALVE                     PULMONIC VALVE AV Area (Vmax):    2.70 cm      PR End Diast Vel: 4.41 msec AV Area (Vmean):   2.40 cm AV Area (VTI):     2.23 cm AV Vmax:           155.00 cm/s AV Vmean:          117.000 cm/s AV VTI:            0.376 m AV Peak Grad:      9.6 mmHg AV Mean Grad:      6.5 mmHg LVOT Vmax:         133.00 cm/s LVOT Vmean:        89.400 cm/s LVOT VTI:          0.267 m LVOT/AV VTI ratio: 0.71 AI PHT:            1323 msec AR Vena Contracta: 0.30 cm  AORTA Ao Root diam: 3.40 cm Ao Asc diam:  3.30 cm  MITRAL VALVE                    TRICUSPID VALVE MV Area (PHT): 2.91 cm         TR Peak grad:   16.8 mmHg MV Decel Time: 261 msec         TR Vmax:        205.00 cm/s MR Peak grad:      133.2 mmHg MR Mean grad:      85.0 mmHg    SHUNTS MR Vmax:            577.00 cm/s  Systemic VTI:  0.27 m MR Vmean:          437.0 cm/s   Systemic Diam: 2.00 cm MR Vena Contracta: 0.20 cm MR PISA:           1.01 cm MR PISA Eff ROA:   4 mm MR PISA Radius:    0.40 cm MV E velocity: 74.10 cm/s MV A velocity: 68.10 cm/s MV E/A ratio:  1.09  Norman Herrlich MD Electronically signed by Norman Herrlich MD Signature Date/Time: 06/28/2023/7:49:30 PM    Final          ______________________________________________________________________________________________       Current Reported Medications:.    Current Meds  Medication Sig   methylPREDNISolone (MEDROL DOSEPAK) 4 MG TBPK tablet Take as instructed on packaging   metoprolol succinate (TOPROL XL) 25 MG 24 hr tablet Take 0.5 tablets (12.5 mg total) by mouth daily.   oxyCODONE-acetaminophen (PERCOCET) 7.5-325 MG tablet Take 1 tablet by mouth 2 (two) times daily as needed.   sildenafil (REVATIO) 20 MG tablet Take 20 mg by mouth daily as needed (ED).   triamcinolone cream (KENALOG) 0.1 % Apply 1 Application topically.   [DISCONTINUED] metoprolol succinate (TOPROL XL) 25 MG 24 hr tablet Take 1 tablet (25 mg total) by mouth daily as needed.   Physical Exam:    VS:  BP 124/72 (BP Location: Right Arm, Patient Position: Sitting, Cuff Size: Large)   Pulse 71   Ht 6' (1.829 m)   Wt 190 lb (86.2 kg)  SpO2 97%   BMI 25.77 kg/m    Wt Readings from Last 3 Encounters:  11/18/23 190 lb (86.2 kg)  10/02/23 184 lb (83.5 kg)  08/13/23 184 lb (83.5 kg)    GEN: Well nourished, well developed in no acute distress NECK: No JVD; No carotid bruits CARDIAC: RRR, no murmurs, rubs, gallops RESPIRATORY:  Clear to auscultation without rales, wheezing or rhonchi  ABDOMEN: Soft, non-tender, non-distended EXTREMITIES:  No edema; No acute deformity     Asessement and Plan:.    Palpitations/Frequent PVCs: Cardiac monitor in 04/2023 showed PVCs, PACs and 4 beats nonsustained ventricular tachycardia.  Patient noted  some improvement with magnesium supplementation.  At last visit was started on Toprol 25 mg daily as needed.  EKG today indicates sinus rhythm with frequent PVCs in a pattern of bigeminy.  Patient notes that he started having palpitations while sitting in the waiting room, continued while he was having his EKG then stopped shortly after.  On auscultation his heart rhythm is regular.  Today patient reports ongoing intermittent episodes of palpitations, denies any associated symptoms.  He has taken metoprolol succinate twice with some noted improvement.  Patient notes the palpitations occur when he is in pain or with increased stress.  He has been taking supplements regularly, tolerating well aside from high blood pressures which improved after decreasing of Gatorade consumption.  Discussed with patient checking cardiac monitor, deferred at this time in favor of starting on metoprolol succinate 12.5 mg nightly, if no improvement in palpitations with metoprolol will check cardiac monitor.  Start metoprolol succinate 12.5 mg nightly.  Check CBC, CMET, TSH and magnesium level.   History of atrial fibrillation: Noted to be remote, 4 weeks after a knee surgery and also after 3 shots of espresso and while traveling.  Denies any recurrences.  Elevated blood pressure: Patient notes some elevations in blood pressure when drinking three packets of Gatorlite daily, with reduction to one packet daily he noted improvement.  Blood pressure today 124/72.  Encouraged patient to monitor his blood pressure and record, to bring record to follow-up.  Dyslipidemia: Patient without a recent lipid profile on file.  Patient would appreciate check of this.  Will plan to check a fasting lipid profile on follow-up.    Disposition: F/u with Reather Littler, NP in four weeks.   Signed, Rip Harbour, NP

## 2023-11-18 ENCOUNTER — Ambulatory Visit: Attending: Cardiology | Admitting: Cardiology

## 2023-11-18 VITALS — BP 124/72 | HR 71 | Ht 72.0 in | Wt 190.0 lb

## 2023-11-18 DIAGNOSIS — I493 Ventricular premature depolarization: Secondary | ICD-10-CM | POA: Diagnosis not present

## 2023-11-18 DIAGNOSIS — I10 Essential (primary) hypertension: Secondary | ICD-10-CM | POA: Diagnosis not present

## 2023-11-18 DIAGNOSIS — R002 Palpitations: Secondary | ICD-10-CM

## 2023-11-18 DIAGNOSIS — I4891 Unspecified atrial fibrillation: Secondary | ICD-10-CM | POA: Diagnosis not present

## 2023-11-18 MED ORDER — METOPROLOL SUCCINATE ER 25 MG PO TB24: 12.5000 mg | ORAL_TABLET | Freq: Every day | ORAL | 2 refills | Status: DC

## 2023-11-18 NOTE — Patient Instructions (Signed)
 Medication Instructions:  Metoprolol succinate 25 mg ( Take 12.5 mg  in the evening). *If you need a refill on your cardiac medications before your next appointment, please call your pharmacy*   Lab Work: CBC, CMET, Magnesium Level, TSH: today If you have labs (blood work) drawn today and your tests are completely normal, you will receive your results only by: MyChart Message (if you have MyChart) OR A paper copy in the mail If you have any lab test that is abnormal or we need to change your treatment, we will call you to review the results.   Testing/Procedures: No Testing   Follow-Up: At [facility], you and your health needs are our priority.  As part of our continuing mission to provide you with exceptional heart care, we have created designated Provider Care Teams.  These Care Teams include your primary Cardiologist (physician) and Advanced Practice Providers (APPs -  Physician Assistants and Nurse Practitioners) who all work together to provide you with the care you need, when you need it.  We recommend signing up for the patient portal called "MyChart".  Sign up information is provided on this After Visit Summary.  MyChart is used to connect with patients for Virtual Visits (Telemedicine).  Patients are able to view lab/test results, encounter notes, upcoming appointments, etc.  Non-urgent messages can be sent to your provider as well.   To learn more about what you can do with MyChart, go to [email or web address].    Your next appointment:   1 month(s)  Provider:   Reather Littler, NP       Other Instructions   1st Floor: - Lobby - Registration  - Pharmacy  - Lab - Cafe  2nd Floor: - PV Lab - Diagnostic Testing (echo, CT, nuclear med)  3rd Floor: - Vacant  4th Floor: - TCTS (cardiothoracic surgery) - AFib Clinic - Structural Heart Clinic - Vascular Surgery  - Vascular Ultrasound  5th Floor: - [facility] (general and EP) - Clinical  Pharmacy for coumadin, hypertension, lipid, weight-loss medications, and med management appointments    Valet parking services will be available as well.

## 2023-11-19 ENCOUNTER — Encounter: Payer: Self-pay | Admitting: Cardiology

## 2023-11-19 LAB — COMPREHENSIVE METABOLIC PANEL
ALT: 21 IU/L (ref 0–44)
AST: 27 IU/L (ref 0–40)
Albumin: 4.4 g/dL (ref 3.8–4.9)
Alkaline Phosphatase: 71 IU/L (ref 44–121)
BUN/Creatinine Ratio: 14 (ref 9–20)
BUN: 13 mg/dL (ref 6–24)
Bilirubin Total: 0.6 mg/dL (ref 0.0–1.2)
CO2: 25 mmol/L (ref 20–29)
Calcium: 9.6 mg/dL (ref 8.7–10.2)
Chloride: 97 mmol/L (ref 96–106)
Creatinine, Ser: 0.9 mg/dL (ref 0.76–1.27)
Globulin, Total: 2.2 g/dL (ref 1.5–4.5)
Glucose: 106 mg/dL — ABNORMAL HIGH (ref 70–99)
Potassium: 5 mmol/L (ref 3.5–5.2)
Sodium: 140 mmol/L (ref 134–144)
Total Protein: 6.6 g/dL (ref 6.0–8.5)
eGFR: 98 mL/min/{1.73_m2} (ref >59)

## 2023-11-19 LAB — MAGNESIUM: Magnesium: 2 mg/dL (ref 1.6–2.3)

## 2023-11-19 LAB — CBC
Hematocrit: 38.8 % (ref 37.5–51.0)
Hemoglobin: 13.3 g/dL (ref 13.0–17.7)
MCH: 32.7 pg (ref 26.6–33.0)
MCHC: 34.3 g/dL (ref 31.5–35.7)
MCV: 95 fL (ref 79–97)
Platelets: 238 10*3/uL (ref 150–450)
RBC: 4.07 x10E6/uL — ABNORMAL LOW (ref 4.14–5.80)
RDW: 11.9 % (ref 11.6–15.4)
WBC: 4.9 10*3/uL (ref 3.4–10.8)

## 2023-11-19 LAB — TSH: TSH: 1.89 u[IU]/mL (ref 0.450–4.500)

## 2023-11-26 DIAGNOSIS — M25561 Pain in right knee: Secondary | ICD-10-CM | POA: Diagnosis not present

## 2023-11-27 ENCOUNTER — Encounter: Payer: BC Managed Care – PPO | Attending: Registered Nurse | Admitting: Physical Medicine and Rehabilitation

## 2023-11-27 VITALS — BP 176/97 | HR 79 | Ht 72.0 in | Wt 190.0 lb

## 2023-11-27 DIAGNOSIS — M25511 Pain in right shoulder: Secondary | ICD-10-CM | POA: Diagnosis not present

## 2023-11-27 DIAGNOSIS — Z79899 Other long term (current) drug therapy: Secondary | ICD-10-CM | POA: Diagnosis not present

## 2023-11-27 DIAGNOSIS — M25512 Pain in left shoulder: Secondary | ICD-10-CM

## 2023-11-27 DIAGNOSIS — M25561 Pain in right knee: Secondary | ICD-10-CM

## 2023-11-27 DIAGNOSIS — G8929 Other chronic pain: Secondary | ICD-10-CM | POA: Insufficient documentation

## 2023-11-27 DIAGNOSIS — G894 Chronic pain syndrome: Secondary | ICD-10-CM

## 2023-11-27 DIAGNOSIS — Z5181 Encounter for therapeutic drug level monitoring: Secondary | ICD-10-CM

## 2023-11-27 MED ORDER — METHYLPREDNISOLONE 4 MG PO TBPK: ORAL_TABLET | ORAL | 0 refills | Status: AC

## 2023-11-27 MED ORDER — NAPROXEN 500 MG PO TABS
500.0000 mg | ORAL_TABLET | Freq: Two times a day (BID) | ORAL | 0 refills | Status: DC
Start: 2023-11-27 — End: 2024-02-26

## 2023-11-27 NOTE — Patient Instructions (Addendum)
 We can increase your percocet to 1 tablet 3 times daily; I will schedule your next medication refills around this.  Start Naproxen 500 mg twice daily for 2 weeks to try and reduce inflammation in your knee; stop this if it causes BP increases or stomach issues.  Continue compression on your knee and follow up with Dr. Everardo Pacific  I am prescribing a steroid pack for late April for your trip  Once your pain is better under control, we will re-address switching to a Butrans patch  Follow up in 3 months

## 2023-11-27 NOTE — Progress Notes (Signed)
 Expand All Collapse All    Subjective:    Subjective Patient ID: Bruce Cobb, male    DOB: 04/21/1966, 58 y.o.   MRN: [id number]   HPI  Bruce Cobb is a 58 y.o. year old male  who  has a past medical history of BCC (basal cell carcinoma of skin), Hematospermia, Paroxysmal atrial fibrillation (HCC) (09/2017), Pars defect of lumbar spine, PONV (postoperative nausea and vomiting), Right knee DJD, and Shingles (2014).   They are presenting to PM&R clinic as a follow up for bilateral shoulder pain.     From last visit: Chronic pain syndrome Encounter for therapeutic drug monitoring Encounter for long-term current use of medication   I have refilled your Percocet 7.5 mg for 2 additional months; I have reduced this down to twice daily, since this is as frequently as you are using it.   I prescribed a short-term steroid Dosepak for your trip.  Please start medication 1 to 2 days before leaving.  I have advised not to take more than 2 or 3 of these Dosepaks over a 51-month period.   Follow-up with me in 2 months.     Chronic pain of both shoulders (s/p bilateral replacements, with L revision reverse SA) -     DG Shoulder Left; Future -     DG Shoulder Right; Future   I am getting repeat x-rays of your bilateral shoulders to look for any worsening arthritis or other changes since your surgeries in 2023.   I am going to be reaching out to your orthopedic surgeon Dr. Ramond Marrow regarding ongoing challenges with pain and range of motion in both of your shoulders, limited benefit from recent bilateral lateral pectoral nerve RFAs by Dr. Jerrilyn Cairo with [facility], and if there would be any concerns with resuming intra-articular injections.  If your surgeon is in agreement and x-rays show nothing concerning, we will plan for this next visit.   Essential hypertension Follow-up with your primary care doctor regarding ongoing hypertension, and with the need to be on a standing medication for this.   Chronic pain  of right knee Continue Celebrex for at least 2 weeks to see if anti-inflammatory effects help your right knee pain.  If you start to get any gastrointestinal upset, I would stop this medication first.     Other orders -     methylPREDNISolone; Take as instructed on packaging  Dispense: 1 each; Refill: 0   Interval Hx:  - Did not hear back from Dr. Everardo Pacific about injection safety; will defer until we hear from his office   - Has bone spurs in his right knee; saw Dr. Everardo Pacific yesterday and is getting an ultrasound soon. He says the knee is worse than the shoulders . He getts swelling in his knee during the daytime and does a compression brace and sock to help with this.    - He says he has a pars defect in his low back; saw chiropractor today which helped significantly but feel his back keep spasming because his knee keeps going out   - Medications: Percocet helps significantly; he will avoid it if he can. He says some days he takes aleve and ibuprofen, but gets no benefit from either. He feels he ned 1.5 tabs generally to bring the pain down significantly.   He wants a steroid dosepak for a trip to [facility] in May; he is aware that this is 2/3 of his yearly packs through this office but it was very  effective last time.   Pain Inventory Average Pain 7 Pain Right Now 7 My pain is constant, aching, and throb   In the last 24 hours, has pain interfered with the following? General activity  moderate Relation with others  little Enjoyment of life  a lot What TIME of day is your pain at its worst? morning  Sleep (in general) Poor   Pain is worse with: walking, inactivity, standing, and some activites Pain improves with: rest, heat/ice, medication, TENS, and heat, massage, ice Relief from Meds: 4        Family History  Problem Relation Age of Onset   Cancer - Prostate Father     Cancer Father          prostate   Cancer Mother          lung, bones, liver   ADD / ADHD Sister     ADD / ADHD  Brother     Diabetes Neg Hx     CAD Neg Hx          Social History         Socioeconomic History   Marital status: Married      Spouse name: Not on file   Number of children: 3   Years of education: Not on file   Highest education level: Not on file  Occupational History   Occupation: Merchant navy officer , has a farm   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    Wife has a history of breast cancer, recurrent, status post bilateral mastectomy    Wife also had thyroid cancer.    The patient has been very athletic all his life, playing basketball, softball, tennis etc.  As of 09/2018 is not very active due to multiple MSK issues.    Social Drivers of Acupuncturist Strain: Not on file  Food Insecurity: Not on file  Transportation Needs: Not on file  Physical Activity: Not on file  Stress: Not on file  Social Connections: Unknown (01/16/2022)    Received from [facility], [facility]    Social Network     Social Network: Not on file         Past Surgical History:  Procedure Laterality Date   ANKLE FRACTURE SURGERY Left 1996    plated   KNEE ARTHROSCOPY Left 1990    plica removed   KNEE ARTHROSCOPY Right      7 total   NASAL SEPTUM SURGERY       ORIF DISTAL RADIUS FRACTURE Right      age 44   REVISION TOTAL KNEE ARTHROPLASTY Right 08/19/2017   REVISION TOTAL SHOULDER TO REVERSE TOTAL SHOULDER Left 10/11/2021    Procedure: LEFT REVISION TOTAL SHOULDER TO REVERSE TOTAL SHOULDER, RIGHT SHOULDER STERIOD INJECTION;  Surgeon: Bjorn Pippin, MD;  Location: [facility];  Service: Orthopedics;  Laterality: Left;   SHOULDER ARTHROSCOPY W/ ROTATOR CUFF REPAIR Right  2011    bicep tendon and labrium repair   SHOULDER ARTHROSCOPY WITH ROTATOR CUFF REPAIR Left 2012  w/ labrium repair   TOTAL KNEE ARTHROPLASTY Right 10/12/2013    Procedure: RIGHT TOTAL KNEE ARTHROPLASTY;  Surgeon: Nilda Simmer, MD;  Location: [facility];  Service: Orthopedics;  Laterality: Right;   VASECTOMY                 Past Surgical History:  Procedure Laterality Date   ANKLE FRACTURE SURGERY Left 1996    plated   KNEE ARTHROSCOPY Left 1990    plica removed   KNEE ARTHROSCOPY Right      7 total   NASAL SEPTUM SURGERY       ORIF DISTAL RADIUS FRACTURE Right      age 29   REVISION TOTAL KNEE ARTHROPLASTY Right 08/19/2017   REVISION TOTAL SHOULDER TO REVERSE TOTAL SHOULDER Left 10/11/2021    Procedure: LEFT REVISION TOTAL SHOULDER TO REVERSE TOTAL SHOULDER, RIGHT SHOULDER STERIOD INJECTION;  Surgeon: Bjorn Pippin, MD;  Location: [facility];  Service: Orthopedics;  Laterality: Left;   SHOULDER ARTHROSCOPY W/ ROTATOR CUFF REPAIR Right 2011    bicep tendon and labrium repair   SHOULDER ARTHROSCOPY WITH ROTATOR CUFF REPAIR Left 2012    w/ labrium repair   TOTAL KNEE ARTHROPLASTY Right 10/12/2013    Procedure: RIGHT TOTAL KNEE ARTHROPLASTY;  Surgeon: Nilda Simmer, MD;  Location: [facility];  Service: Orthopedics;  Laterality: Right;   VASECTOMY                Past Medical History:  Diagnosis Date   BCC (basal cell carcinoma of skin)     Hematospermia      hx of, remotely    Paroxysmal atrial fibrillation (HCC) 09/2017    saw cards   Pars defect of lumbar spine     PONV (postoperative nausea and vomiting)     Right knee DJD     Shingles 2014    face        There were no vitals taken for this visit.   Opioid Risk Score:   Fall Risk Score:  `1   Depression screen [facility] 2/9       04/15/2023    2:32 PM 01/21/2023    3:08 PM 09/24/2018   12:21 PM  Depression screen PHQ 2/9  Decreased Interest 0 2 0  Down, Depressed, Hopeless 0 3 0  PHQ - 2 Score 0 5 0  Altered sleeping   3 1   Tired, decreased energy   1 0  Change in appetite   2 0  Feeling bad or failure about yourself    0 0  Trouble concentrating   0 0  Moving slowly or fidgety/restless   0 0  Suicidal thoughts   3 0  PHQ-9 Score   14 1    Review of Systems  Musculoskeletal:  Positive for back pain and neck pain.       Pain in both shoulders, right knee  All other systems reviewed and are negative.         Objective:    Objective Physical Exam     PE: Constitution: Appropriate appearance for age. No apparent distress   Resp: No respiratory distress. No accessory muscle usage. on RA and CTAB Cardio: Well perfused appearance. No peripheral edema. Abdomen: Nondistended. Nontender.   Psych: Appropriate mood and affect. Neuro: AAOx4. No apparent cognitive deficits. No apparent sensory deficits.      MSK:  Right knee no ttp,.  No range of motion deficits, no apparent deformity. Mild lateral effusion  Bilateral upper extremities unchanged, limited range of motion in abduction, internal rotation.       Assessment & Plan:    Bruce Cobb is a 58 y.o. year old male  who  has a past medical history of BCC (basal cell carcinoma of skin), Hematospermia, Paroxysmal atrial fibrillation (HCC) (09/2017), Pars defect of lumbar spine, PONV (postoperative nausea and vomiting), Right knee DJD, and Shingles (2014).   They are presenting to PM&R clinic as a follow up for bilateral shoulder pain.   Chronic pain syndrome Encounter for therapeutic drug monitoring Encounter for long-term current use of medication We can increase your percocet to 1 tablet 3 times daily; I will schedule your next medication refills around this.  Start Naproxen 500 mg twice daily for 2 weeks to try and reduce inflammation in your knee; stop this if it causes BP increases or stomach issues.  I am prescribing a steroid pack for late April for your trip  Once your pain is better under control, we will re-address switching to a  Butrans patch  Follow up in 3 months  Chronic pain of both shoulders Less bothersome in the knee currently  Has failed bilateral replacements, medication management, and peripheral nerve blocks at this point.  Spoke with Dr. Everardo Pacific 3-31; do not feel further interventions would assist in pain management for the patient's bilateral shoulders.  Unable to do injections intra-articularly on either side.  Amount of pain exceeds pathology as expected; ?  CRPS  Chronic pain of right knee Continue compression on your knee and follow-up with Dr. Everardo Pacific for further intervention.

## 2023-11-27 NOTE — Progress Notes (Signed)
 Subjective:    Patient ID: Bruce Cobb, male    DOB: 1966/08/05, 58 y.o.   MRN: [id number]  HPI  Pain Inventory6 Average Pain  Pain Right Now 6 My pain is aching and throbbing  In the last 24 hours, has pain interfered with the following? General activity 7 Relation with others 0 Enjoyment of life 8 What TIME of day is your pain at its worst? varies Sleep (in general) Poor  Pain is worse with: some activites and showering, driving Pain improves with: medication and hot tub Relief from Meds:  .  Family History  Problem Relation Age of Onset  . Cancer - Prostate Father   . Cancer Father        prostate  . Cancer Mother        lung, bones, liver  . ADD / ADHD Sister   . ADD / ADHD Brother   . Diabetes Neg Hx   . CAD Neg Hx    Social History   Socioeconomic History  . Marital status: Married    Spouse name: Not on file  . Number of children: 3  . Years of education: Not on file  . Highest education level: Not on file  Occupational History  . Occupation: Merchant navy officer , has a farm  . 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 Smoking status: Never  . Smokeless tobacco: Never  Vaping Use  . Vaping status: Never Used  Substance and Sexual Activity  . Alcohol use: Yes    Comment: 3 drinks per day  . Drug use: No  . Sexual activity: Yes  Other Topics Concern  . Not on file  Social History Narrative   Married, 3 children.   Wife has a history of breast cancer, recurrent, status post bilateral mastectomy   Wife also had thyroid cancer.   The patient has been very athletic all his life, playing basketball, softball, tennis etc.  As of 09/2018 is not very active due to multiple MSK issues.   Social Drivers of Corporate investment banker Strain: Not on file  Food Insecurity: Not on file  Transportation Needs: Not on file  Physical Activity: Not on file  Stress: Not on file  Social Connections: Unknown (01/16/2022)   Received from [facility], [facility]   Social Network   . Social Network: Not on file   Past Surgical History:  Procedure Laterality Date  . ANKLE FRACTURE SURGERY Left 1996   plated  . KNEE ARTHROSCOPY Left 1990   plica removed  . KNEE ARTHROSCOPY Right    7 total  . NASAL SEPTUM SURGERY    . ORIF DISTAL RADIUS FRACTURE Right    age 81  . REVISION TOTAL KNEE ARTHROPLASTY Right 08/19/2017  . REVISION TOTAL SHOULDER TO REVERSE TOTAL SHOULDER Left 10/11/2021   Procedure: LEFT REVISION TOTAL SHOULDER TO REVERSE TOTAL SHOULDER, RIGHT SHOULDER STERIOD INJECTION;  Surgeon: Bjorn Pippin, MD;  Location: [facility];  Service: Orthopedics;  Laterality: Left;  . SHOULDER ARTHROSCOPY W/ ROTATOR CUFF REPAIR Right 2011   bicep tendon and labrium repair  . SHOULDER ARTHROSCOPY WITH ROTATOR CUFF REPAIR Left 2012   w/ labrium repair  . TOTAL KNEE ARTHROPLASTY Right 10/12/2013   Procedure: RIGHT TOTAL KNEE ARTHROPLASTY;  Surgeon: Nilda Simmer, MD;  Location: [facility];  Service: Orthopedics;  Laterality: Right;  Marland Kitchen VASECTOMY     Past Surgical History:  Procedure Laterality Date  . ANKLE FRACTURE  SURGERY Left 1996   plated  . KNEE ARTHROSCOPY Left 1990   plica removed  . KNEE ARTHROSCOPY Right    7 total  . NASAL SEPTUM SURGERY    . ORIF DISTAL RADIUS FRACTURE Right    age 1  . REVISION TOTAL KNEE ARTHROPLASTY Right 08/19/2017  . REVISION TOTAL SHOULDER TO REVERSE TOTAL SHOULDER Left 10/11/2021   Procedure: LEFT REVISION TOTAL SHOULDER TO REVERSE TOTAL SHOULDER, RIGHT SHOULDER STERIOD INJECTION;  Surgeon: Bjorn Pippin, MD;  Location: [facility];  Service: Orthopedics;  Laterality: Left;  . SHOULDER ARTHROSCOPY W/ ROTATOR CUFF REPAIR Right 2011   bicep tendon and labrium repair  . SHOULDER ARTHROSCOPY WITH ROTATOR CUFF REPAIR Left 2012   w/ labrium repair  . TOTAL KNEE ARTHROPLASTY Right 10/12/2013   Procedure: RIGHT TOTAL KNEE ARTHROPLASTY;  Surgeon: Nilda Simmer, MD;  Location: [facility];  Service: Orthopedics;  Laterality: Right;  Marland Kitchen  VASECTOMY     Past Medical History:  Diagnosis Date  . BCC (basal cell carcinoma of skin)   . Hematospermia    hx of, remotely   . Paroxysmal atrial fibrillation (HCC) 09/2017   saw cards  . Pars defect of lumbar spine   . PONV (postoperative nausea and vomiting)   . Right knee DJD   . Shingles 2014   face   BP (!) 176/97   Pulse 79   Ht 6' (1.829 m)   Wt 190 lb (86.2 kg)   SpO2 100%   BMI 25.77 kg/m   Opioid Risk Score:   Fall Risk Score:  `1  Depression screen [facility] 2/9     10/02/2023    1:35 PM 04/15/2023    2:32 PM 01/21/2023    3:08 PM 09/24/2018   12:21 PM  Depression screen PHQ 2/9  Decreased Interest 0 0 2 0  Down, Depressed, Hopeless 0 0 3 0  PHQ - 2 Score 0 0 5 0  Altered sleeping   3 1  Tired, decreased energy   1 0  Change in appetite   2 0  Feeling bad or failure about yourself    0 0  Trouble concentrating   0 0  Moving slowly or fidgety/restless   0 0  Suicidal thoughts   3 0  PHQ-9 Score   14 1     Review of Systems  Musculoskeletal:  Positive for back pain.       Bilateral shoulder pain Right knee pain  All other systems reviewed and are negative.     Objective:   Physical Exam        Assessment & Plan:

## 2023-11-29 MED ORDER — OXYCODONE-ACETAMINOPHEN 7.5-325 MG PO TABS: 1.0000 | ORAL_TABLET | Freq: Three times a day (TID) | ORAL | 0 refills | Status: AC | PRN

## 2023-11-29 MED ORDER — OXYCODONE-ACETAMINOPHEN 7.5-325 MG PO TABS: 1.0000 | ORAL_TABLET | Freq: Three times a day (TID) | ORAL | 0 refills | Status: DC | PRN

## 2023-12-02 DIAGNOSIS — M25561 Pain in right knee: Secondary | ICD-10-CM | POA: Diagnosis not present

## 2023-12-02 DIAGNOSIS — M76891 Other specified enthesopathies of right lower limb, excluding foot: Secondary | ICD-10-CM | POA: Diagnosis not present

## 2023-12-16 NOTE — Progress Notes (Deleted)
 Cardiology Office Note    Date:  12/16/2023  ID:  Hoy Finlay, DOB [birth year], MRN [id number] PCP:  Pcp, No  Cardiologist:  Olga Millers, MD  Electrophysiologist:  None   Chief Complaint: ***  History of Present Illness: .    Bruce Cobb is a 58 y.o. male with visit-pertinent history of emote atrial fibrillation per report and frequent PVCs on EKG in 11/2023.  Patient self-referred and was seen by Dr. Jens Som on 06/17/2023 for evaluation of palpitations.  Patient has a distant history of atrial fibrillation by report.  He was previously evaluated by [facility] in 2024 with complaints of palpitations, felt to be secondary to caffeine, alcohol, dehydration and poor p.o. intake.  Lab work at that time showed BNP less than 50, potassium 4.2, creatinine 0.92, magnesium 1.8, normal troponin and hemoglobin 13.9.  Zio patch in August 2024 showed sinus rhythm, 2 episodes of ventricular tachycardia longest lasting 4 beats, brief 9 beat runs of SVT with PACs and PVCs.  Patient was evaluate by Dr. Jens Som in 06/2023 for a second opinion.  Reported intermittent palpitations for several years described as a skip however more recently and had episodes where he felt sustained palpitations and more frequent described as a flutter.  Denied dyspnea, chest pain or syncope.  Noted some improvement with magnesium supplementation.  Echocardiogram was recommended as well as continuation with magnesium and he was also started on Toprol 25 mg daily as needed.  Echocardiogram on 06/28/2023 indicated LVEF of 60 to 65%, normal function, no RWMA, diastolic parameters were normal, RV systolic function and size was normal, there is mild aortic valve regurgitation.  Patient was seen for follow-up on 11/18/2023, he reported he is overall been doing well.  He did continue to note episodes of palpitations, specifically when stressed or when agitated.  Patient was started on metoprolol succinate 12.5 mg nightly.  Today presents for  follow-up.  He reports that he   Palpitations/frequent PVCs: Cardiac monitor in 04/2023 showed PVCs, PACs and 4 beats nonsustained ventricular tachycardia. Patient noted some improvement with magnesium supplementation. At last visit patient noted increased palpitations  Today he reports   History of atrial fibrillation: Noted to be remote, 4 weeks after a knee surgery and also after 3 shots of espresso and while traveling. Denies any recurrences.   Elevated blood pressure: Blood pressure today   Dyslipidemia: Check fasting lipid profile and LFTs.    Labwork independently reviewed:   ROS: .   *** denies chest pain, shortness of breath, lower extremity edema, fatigue, palpitations, melena, hematuria, hemoptysis, diaphoresis, weakness, presyncope, syncope, orthopnea, and PND.  All other systems are reviewed and otherwise negative.  Studies Reviewed: Marland Kitchen    EKG:  EKG is ordered today, personally reviewed, demonstrating ***     CV Studies: Cardiac studies reviewed are outlined and summarized above. Otherwise please see EMR for full report. Cardiac Studies & Procedures   ______________________________________________________________________________________________     ECHOCARDIOGRAM  ECHOCARDIOGRAM COMPLETE 06/28/2023  Narrative ECHOCARDIOGRAM REPORT    Patient Name:   Bruce Cobb Date of Exam: 06/28/2023 Medical Rec #:  [id number]    Height:       72.0 in Accession #:    [id number]   Weight:       182.0 lb Date of Birth:  [birth year]     BSA:          2.047 m Patient Age:    57 years     BP:  134/74 mmHg Patient Gender: M            HR:           72 bpm. Exam Location:  [place]  Procedure: 2D Echo, Cardiac Doppler and Color Doppler  Indications:    I48.91* Unspeicified atrial fibrillation  History:        Patient has no prior history of Echocardiogram examinations. Arrythmias:PVC, Atrial Fibrillation and Palpitations; Risk Factors:Hypertension and  Non-Smoker.  Sonographer:    Jake Seats RDMS, RVT, RDCS Referring Phys: [id number] BRIAN S CRENSHAW  IMPRESSIONS   1. Frequent PVCs and bigeminy. Left ventricular ejection fraction, by estimation, is 60 to 65%. Left ventricular ejection fraction by 2D MOD biplane is 60.4 %. The left ventricle has normal function. The left ventricle has no regional wall motion abnormalities. Left ventricular diastolic parameters were normal. 2. Right ventricular systolic function is normal. The right ventricular size is normal. 3. The mitral valve is normal in structure. Mild mitral valve regurgitation. No evidence of mitral stenosis. 4. The aortic valve is tricuspid. Aortic valve regurgitation is mild. No aortic stenosis is present. 5. The inferior vena cava is normal in size with greater than 50% respiratory variability, suggesting right atrial pressure of 3 mmHg.  FINDINGS Left Ventricle: Frequent PVCs and bigeminy. Left ventricular ejection fraction, by estimation, is 60 to 65%. Left ventricular ejection fraction by 2D MOD biplane is 60.4 %. The left ventricle has normal function. The left ventricle has no regional wall motion abnormalities. The left ventricular internal cavity size was normal in size. There is no left ventricular hypertrophy. Left ventricular diastolic parameters were normal. Normal left ventricular filling pressure.  Right Ventricle: The right ventricular size is normal. No increase in right ventricular wall thickness. Right ventricular systolic function is normal.  Left [facility]: Left atrial size was normal in size.  Right [facility]: Right atrial size was normal in size.  Pericardium: There is no evidence of pericardial effusion.  Mitral Valve: The mitral valve is normal in structure. Mild mitral valve regurgitation. No evidence of mitral valve stenosis.  Tricuspid Valve: The tricuspid valve is normal in structure. Tricuspid valve regurgitation is mild . No evidence of tricuspid  stenosis.  Aortic Valve: The aortic valve is tricuspid. Aortic valve regurgitation is mild. Aortic regurgitation PHT measures 1323 msec. No aortic stenosis is present. Aortic valve mean gradient measures 6.5 mmHg. Aortic valve peak gradient measures 9.6 mmHg. Aortic valve area, by VTI measures 2.23 cm.  Pulmonic Valve: The pulmonic valve was normal in structure. Pulmonic valve regurgitation is trivial. No evidence of pulmonic stenosis.  Aorta: The aortic arch was not well visualized and the aortic root and ascending aorta are structurally normal, with no evidence of dilitation.  Venous: A normal flow pattern is recorded from the right upper pulmonary vein. The inferior vena cava is normal in size with greater than 50% respiratory variability, suggesting right atrial pressure of 3 mmHg.  IAS/Shunts: No atrial level shunt detected by color flow Doppler.   LEFT VENTRICLE PLAX 2D                        Biplane EF (MOD) LVIDd:         5.50 cm         LV Biplane EF:   Left LVIDs:         3.60 cm  ventricular LV PW:         1.10 cm                          ejection LV IVS:        1.00 cm                          fraction by LVOT diam:     2.00 cm                          2D MOD LV SV:         84                               biplane is LV SV Index:   41                               60.4 %. LVOT Area:     3.14 cm Diastology LV e' medial:    10.10 cm/s LV Volumes (MOD)               LV E/e' medial:  7.3 LV vol d, MOD    122.0 ml      LV e' lateral:   14.10 cm/s A2C:                           LV E/e' lateral: 5.3 LV vol d, MOD    103.4 ml A4C: LV vol s, MOD    45.7 ml A2C: LV vol s, MOD    44.8 ml A4C: LV SV MOD A2C:   76.4 ml LV SV MOD A4C:   103.4 ml LV SV MOD BP:    68.8 ml  RIGHT VENTRICLE RV S prime:     14.10 cm/s TAPSE (M-mode): 3.0 cm  LEFT ATRIUM             Index        RIGHT ATRIUM           Index LA diam:        4.10 cm 2.00 cm/m   RA Area:      18.50 cm LA Vol (A2C):   84.6 ml 41.33 ml/m  RA Volume:   54.20 ml  26.48 ml/m LA Vol (A4C):   44.3 ml 21.64 ml/m LA Biplane Vol: 65.5 ml 32.00 ml/m AORTIC VALVE                     PULMONIC VALVE AV Area (Vmax):    2.70 cm      PR End Diast Vel: 4.41 msec AV Area (Vmean):   2.40 cm AV Area (VTI):     2.23 cm AV Vmax:           155.00 cm/s AV Vmean:          117.000 cm/s AV VTI:            0.376 m AV Peak Grad:      9.6 mmHg AV Mean Grad:      6.5 mmHg LVOT Vmax:         133.00 cm/s LVOT Vmean:        89.400 cm/s LVOT VTI:  0.267 m LVOT/AV VTI ratio: 0.71 AI PHT:            1323 msec AR Vena Contracta: 0.30 cm  AORTA Ao Root diam: 3.40 cm Ao Asc diam:  3.30 cm  MITRAL VALVE                    TRICUSPID VALVE MV Area (PHT): 2.91 cm         TR Peak grad:   16.8 mmHg MV Decel Time: 261 msec         TR Vmax:        205.00 cm/s MR Peak grad:      133.2 mmHg MR Mean grad:      85.0 mmHg    SHUNTS MR Vmax:           577.00 cm/s  Systemic VTI:  0.27 m MR Vmean:          437.0 cm/s   Systemic Diam: 2.00 cm MR Vena Contracta: 0.20 cm MR PISA:           1.01 cm MR PISA Eff ROA:   4 mm MR PISA Radius:    0.40 cm MV E velocity: 74.10 cm/s MV A velocity: 68.10 cm/s MV E/A ratio:  1.09  Zoe Hinds MD Electronically signed by Zoe Hinds MD Signature Date/Time: 06/28/2023/7:49:30 PM    Final          ______________________________________________________________________________________________       Current Reported Medications:.    No outpatient medications have been marked as taking for the 12/18/23 encounter (Appointment) with Antonio Woodhams D, NP.    Physical Exam:    VS:  There were no vitals taken for this visit.   Wt Readings from Last 3 Encounters:  11/27/23 190 lb (86.2 kg)  11/18/23 190 lb (86.2 kg)  10/02/23 184 lb (83.5 kg)    GEN: Well nourished, well developed in no acute distress NECK: No JVD; No carotid bruits CARDIAC: ***RRR,  no murmurs, rubs, gallops RESPIRATORY:  Clear to auscultation without rales, wheezing or rhonchi  ABDOMEN: Soft, non-tender, non-distended EXTREMITIES:  No edema; No acute deformity     Asessement and Plan:.     ***     Disposition: F/u with ***  Signed, Deslyn Cavenaugh D Rowynn Mcweeney, NP

## 2023-12-18 ENCOUNTER — Ambulatory Visit: Admitting: Cardiology

## 2023-12-18 DIAGNOSIS — I4891 Unspecified atrial fibrillation: Secondary | ICD-10-CM

## 2023-12-18 DIAGNOSIS — R002 Palpitations: Secondary | ICD-10-CM

## 2023-12-18 DIAGNOSIS — I493 Ventricular premature depolarization: Secondary | ICD-10-CM

## 2023-12-18 DIAGNOSIS — I10 Essential (primary) hypertension: Secondary | ICD-10-CM

## 2023-12-24 DIAGNOSIS — M25561 Pain in right knee: Secondary | ICD-10-CM | POA: Diagnosis not present

## 2023-12-27 DIAGNOSIS — M25519 Pain in unspecified shoulder: Secondary | ICD-10-CM | POA: Diagnosis not present

## 2023-12-27 DIAGNOSIS — Z6826 Body mass index (BMI) 26.0-26.9, adult: Secondary | ICD-10-CM | POA: Diagnosis not present

## 2023-12-27 DIAGNOSIS — Z96651 Presence of right artificial knee joint: Secondary | ICD-10-CM | POA: Diagnosis not present

## 2023-12-27 DIAGNOSIS — N529 Male erectile dysfunction, unspecified: Secondary | ICD-10-CM | POA: Diagnosis not present

## 2023-12-27 DIAGNOSIS — G8929 Other chronic pain: Secondary | ICD-10-CM | POA: Diagnosis not present

## 2024-01-06 DIAGNOSIS — M25511 Pain in right shoulder: Secondary | ICD-10-CM | POA: Diagnosis not present

## 2024-01-06 DIAGNOSIS — M25561 Pain in right knee: Secondary | ICD-10-CM | POA: Diagnosis not present

## 2024-01-06 DIAGNOSIS — M25612 Stiffness of left shoulder, not elsewhere classified: Secondary | ICD-10-CM | POA: Diagnosis not present

## 2024-01-06 DIAGNOSIS — M25512 Pain in left shoulder: Secondary | ICD-10-CM | POA: Diagnosis not present

## 2024-01-14 DIAGNOSIS — M25612 Stiffness of left shoulder, not elsewhere classified: Secondary | ICD-10-CM | POA: Diagnosis not present

## 2024-01-14 DIAGNOSIS — M25512 Pain in left shoulder: Secondary | ICD-10-CM | POA: Diagnosis not present

## 2024-01-14 DIAGNOSIS — M25561 Pain in right knee: Secondary | ICD-10-CM | POA: Diagnosis not present

## 2024-01-14 DIAGNOSIS — M25511 Pain in right shoulder: Secondary | ICD-10-CM | POA: Diagnosis not present

## 2024-01-29 NOTE — Progress Notes (Signed)
 Cardiology Office Note    Date:  02/02/2024  ID:  Bruce Cobb, DOB 10/17/1965, MRN [id number] PCP:  Pcp, No  Cardiologist:  Alexandria Angel, MD  Electrophysiologist:  None   Chief Complaint: Follow up for palpitations   History of Present Illness: .    Bruce Cobb is a 58 y.o. male with visit-pertinent history of remote atrial fibrillation per report and frequent PVCs on EKG in 11/2023.   Patient self-referred and was seen by Dr. Audery Blazing on 06/17/2023 for evaluation of palpitations.  Patient has a distant history of atrial fibrillation by report.  He was previously evaluated by [facility] in 2024 with complaints of palpitations, felt to be secondary to caffeine, alcohol, dehydration and poor p.o. intake.  Lab work at that time showed BNP less than 50, potassium 4.2, creatinine 0.92, magnesium 1.8, normal troponin and hemoglobin 13.9.  Zio patch in August 2024 showed sinus rhythm, 2 episodes of ventricular tachycardia longest lasting 4 beats, brief 9 beat runs of SVT with PACs and PVCs.  Patient was evaluate by Dr. Audery Blazing in 06/2023 for a second opinion.  Reported intermittent palpitations for several years described as a skip however more recently and had episodes where he felt sustained palpitations and more frequent described as a flutter.  Denied dyspnea, chest pain or syncope.  Noted some improvement with magnesium supplementation.  Echocardiogram was recommended as well as continuation with magnesium and he was also started on Toprol  25 mg daily as needed.  Echocardiogram on 06/28/2023 indicated LVEF of 60 to 65%, normal function, no RWMA, diastolic parameters were normal, RV systolic function and size was normal, there is mild aortic valve regurgitation.   Patient was seen for follow-up on 11/18/2023, he reported he is overall been doing well.  He did continue to note episodes of palpitations, specifically when stressed or when agitated.  Patient was started on metoprolol  succinate 12.5 mg  nightly.   Today presents for follow-up.  He reports that he is doing well overall.  He reports that he did start on metoprolol  regularly 2 weeks ago and has noted increased fatigue, he does not feel that this improved palpitations, actually questions if it made them worse.  He reports prior to this he had started exercising and taking a magnesium supplement, he feels that this helped his palpitations and blood pressure significantly.  Patient reports following last office visit he decreased his electrolyte beverage supplementation with improvement in blood pressure.  He feels that there was too much sodium in his diet resulting in increased blood pressures.  He reports that he would like to discontinue use of metoprolol  and continue with exercise and supplements for palpitations.  ROS: .   Today he denies chest pain, shortness of breath, lower extremity edema, fatigue, melena, hematuria, hemoptysis, diaphoresis, weakness, presyncope, syncope, orthopnea, and PND.  All other systems are reviewed and otherwise negative. Studies Reviewed: Aaron Aas   EKG:  EKG is not ordered today.  CV Studies: Cardiac studies reviewed are outlined and summarized above. Otherwise please see EMR for full report. Cardiac Studies & Procedures   ______________________________________________________________________________________________     ECHOCARDIOGRAM  ECHOCARDIOGRAM COMPLETE 06/28/2023  Narrative ECHOCARDIOGRAM REPORT    Patient Name:   Bruce Cobb Date of Exam: 06/28/2023 Medical Rec #:  [id number]    Height:       72.0 in Accession #:    [id number]   Weight:       182.0 lb Date of Birth:  06-07-1966  BSA:          2.047 m Patient Age:    57 years     BP:           134/74 mmHg Patient Gender: M            HR:           72 bpm. Exam Location:  [place] [facility]  Procedure: 2D Echo, Cardiac Doppler and Color Doppler  Indications:    I48.91* Unspeicified atrial fibrillation  History:        Patient has no prior  history of Echocardiogram examinations. Arrythmias:PVC, Atrial Fibrillation and Palpitations; Risk Factors:Hypertension and Non-Smoker.  Sonographer:    Lyndal Sandy RDMS, RVT, RDCS Referring Phys: [id number] BRIAN S CRENSHAW  IMPRESSIONS   1. Frequent PVCs and bigeminy. Left ventricular ejection fraction, by estimation, is 60 to 65%. Left ventricular ejection fraction by 2D MOD biplane is 60.4 %. The left ventricle has normal function. The left ventricle has no regional wall motion abnormalities. Left ventricular diastolic parameters were normal. 2. Right ventricular systolic function is normal. The right ventricular size is normal. 3. The mitral valve is normal in structure. Mild mitral valve regurgitation. No evidence of mitral stenosis. 4. The aortic valve is tricuspid. Aortic valve regurgitation is mild. No aortic stenosis is present. 5. The inferior vena cava is normal in size with greater than 50% respiratory variability, suggesting right atrial pressure of 3 mmHg.  FINDINGS Left Ventricle: Frequent PVCs and bigeminy. Left ventricular ejection fraction, by estimation, is 60 to 65%. Left ventricular ejection fraction by 2D MOD biplane is 60.4 %. The left ventricle has normal function. The left ventricle has no regional wall motion abnormalities. The left ventricular internal cavity size was normal in size. There is no left ventricular hypertrophy. Left ventricular diastolic parameters were normal. Normal left ventricular filling pressure.  Right Ventricle: The right ventricular size is normal. No increase in right ventricular wall thickness. Right ventricular systolic function is normal.  Left [facility]: Left atrial size was normal in size.  Right [facility]: Right atrial size was normal in size.  Pericardium: There is no evidence of pericardial effusion.  Mitral Valve: The mitral valve is normal in structure. Mild mitral valve regurgitation. No evidence of mitral valve  stenosis.  Tricuspid Valve: The tricuspid valve is normal in structure. Tricuspid valve regurgitation is mild . No evidence of tricuspid stenosis.  Aortic Valve: The aortic valve is tricuspid. Aortic valve regurgitation is mild. Aortic regurgitation PHT measures 1323 msec. No aortic stenosis is present. Aortic valve mean gradient measures 6.5 mmHg. Aortic valve peak gradient measures 9.6 mmHg. Aortic valve area, by VTI measures 2.23 cm.  Pulmonic Valve: The pulmonic valve was normal in structure. Pulmonic valve regurgitation is trivial. No evidence of pulmonic stenosis.  Aorta: The aortic arch was not well visualized and the aortic root and ascending aorta are structurally normal, with no evidence of dilitation.  Venous: A normal flow pattern is recorded from the right upper pulmonary vein. The inferior vena cava is normal in size with greater than 50% respiratory variability, suggesting right atrial pressure of 3 mmHg.  IAS/Shunts: No atrial level shunt detected by color flow Doppler.   LEFT VENTRICLE PLAX 2D                        Biplane EF (MOD) LVIDd:         5.50 cm         LV  Biplane EF:   Left LVIDs:         3.60 cm                          ventricular LV PW:         1.10 cm                          ejection LV IVS:        1.00 cm                          fraction by LVOT diam:     2.00 cm                          2D MOD LV SV:         84                               biplane is LV SV Index:   41                               60.4 %. LVOT Area:     3.14 cm Diastology LV e' medial:    10.10 cm/s LV Volumes (MOD)               LV E/e' medial:  7.3 LV vol d, MOD    122.0 ml      LV e' lateral:   14.10 cm/s A2C:                           LV E/e' lateral: 5.3 LV vol d, MOD    103.4 ml A4C: LV vol s, MOD    45.7 ml A2C: LV vol s, MOD    44.8 ml A4C: LV SV MOD A2C:   76.4 ml LV SV MOD A4C:   103.4 ml LV SV MOD BP:    68.8 ml  RIGHT VENTRICLE RV S prime:     14.10  cm/s TAPSE (M-mode): 3.0 cm  LEFT ATRIUM             Index        RIGHT ATRIUM           Index LA diam:        4.10 cm 2.00 cm/m   RA Area:     18.50 cm LA Vol (A2C):   84.6 ml 41.33 ml/m  RA Volume:   54.20 ml  26.48 ml/m LA Vol (A4C):   44.3 ml 21.64 ml/m LA Biplane Vol: 65.5 ml 32.00 ml/m AORTIC VALVE                     PULMONIC VALVE AV Area (Vmax):    2.70 cm      PR End Diast Vel: 4.41 msec AV Area (Vmean):   2.40 cm AV Area (VTI):     2.23 cm AV Vmax:           155.00 cm/s AV Vmean:          117.000 cm/s AV VTI:            0.376 m AV Peak Grad:  9.6 mmHg AV Mean Grad:      6.5 mmHg LVOT Vmax:         133.00 cm/s LVOT Vmean:        89.400 cm/s LVOT VTI:          0.267 m LVOT/AV VTI ratio: 0.71 AI PHT:            1323 msec AR Vena Contracta: 0.30 cm  AORTA Ao Root diam: 3.40 cm Ao Asc diam:  3.30 cm  MITRAL VALVE                    TRICUSPID VALVE MV Area (PHT): 2.91 cm         TR Peak grad:   16.8 mmHg MV Decel Time: 261 msec         TR Vmax:        205.00 cm/s MR Peak grad:      133.2 mmHg MR Mean grad:      85.0 mmHg    SHUNTS MR Vmax:           577.00 cm/s  Systemic VTI:  0.27 m MR Vmean:          437.0 cm/s   Systemic Diam: 2.00 cm MR Vena Contracta: 0.20 cm MR PISA:           1.01 cm MR PISA Eff ROA:   4 mm MR PISA Radius:    0.40 cm MV E velocity: 74.10 cm/s MV A velocity: 68.10 cm/s MV E/A ratio:  1.09  Zoe Hinds MD Electronically signed by Zoe Hinds MD Signature Date/Time: 06/28/2023/7:49:30 PM    Final          ______________________________________________________________________________________________       Current Reported Medications:.    Current Meds  Medication Sig   amoxicillin (AMOXIL) 500 MG tablet Take 500 mg by mouth. PRIOR TO DENTAL APPOINTMENTS   magnesium oxide (MAG-OX) 400 (240 Mg) MG tablet Take 400 mg by mouth daily.   methylPREDNISolone  (MEDROL  DOSEPAK) 4 MG TBPK tablet Take as instructed on  packaging; start 2-3 days before your trip   [EXPIRED] oxyCODONE -acetaminophen  (PERCOCET) 7.5-325 MG tablet Take 1 tablet by mouth every 8 (eight) hours as needed for severe pain (pain score 7-10).   sildenafil (REVATIO) 20 MG tablet Take 20 mg by mouth daily as needed (ED).   triamcinolone  cream (KENALOG ) 0.1 % Apply 1 Application topically.   [DISCONTINUED] oxyCODONE -acetaminophen  (PERCOCET) 7.5-325 MG tablet Take 1 tablet by mouth every 8 (eight) hours as needed for severe pain (pain score 7-10).    Physical Exam:    VS:  BP 134/78   Pulse 67   Ht 6' (1.829 m)   Wt 188 lb (85.3 kg)   SpO2 97%   BMI 25.50 kg/m    Wt Readings from Last 3 Encounters:  01/31/24 188 lb (85.3 kg)  11/27/23 190 lb (86.2 kg)  11/18/23 190 lb (86.2 kg)    GEN: Well nourished, well developed in no acute distress NECK: No JVD; No carotid bruits CARDIAC: RRR, no murmurs, rubs, gallops RESPIRATORY:  Clear to auscultation without rales, wheezing or rhonchi  ABDOMEN: Soft, non-tender, non-distended EXTREMITIES:  No edema; No acute deformity     Asessement and Plan:.    Palpitations/frequent PVCs: Cardiac monitor in 04/2023 showed PVCs, PACs and 4 beats nonsustained ventricular tachycardia. Patient noted some improvement with magnesium supplementation. At last visit patient noted increased palpitations when agitated and throughout the day. He noted significant  pain related to prior back surgeries, he was started on metoprolol  succinate 12.5 mg daily.  Today he reports that he has started increasing his exercise and was recently started taking a magnesium supplement, he reports that this improved his palpitations.  He notes that he did start taking metoprolol  regularly a few weeks ago which resulted in increased fatigue and GI upset.  Patient reports that he intends to discontinue use of metoprolol  and continue with exercise and supplements.  He notes that if his palpitations worsen he will notify the office.    History of atrial fibrillation: Noted to be remote, 4 weeks after a knee surgery and also after 3 shots of espresso and while traveling. Denies any recurrences.    Elevated blood pressure: Initial blood pressure today 142/88, on recheck was 134/78.  Patient has been keeping intermittent blood pressure log at home, overall appears well-controlled with most blood pressures being less than 130/80.  Encouraged patient to monitor his blood pressure at home, reviewed correct manner and to monitor his blood pressure.  Patient to notify the office if consistently elevated above 130/80.    Disposition: F/u with Dr. Audery Blazing in six months or sooner if needed.   Signed, Meric Joye D Hartley Urton, NP

## 2024-01-31 ENCOUNTER — Encounter: Payer: Self-pay | Admitting: Cardiology

## 2024-01-31 ENCOUNTER — Ambulatory Visit: Attending: Cardiology | Admitting: Cardiology

## 2024-01-31 VITALS — BP 134/78 | HR 67 | Ht 72.0 in | Wt 188.0 lb

## 2024-01-31 DIAGNOSIS — R002 Palpitations: Secondary | ICD-10-CM

## 2024-01-31 DIAGNOSIS — I493 Ventricular premature depolarization: Secondary | ICD-10-CM

## 2024-01-31 DIAGNOSIS — I4891 Unspecified atrial fibrillation: Secondary | ICD-10-CM | POA: Diagnosis not present

## 2024-01-31 DIAGNOSIS — I10 Essential (primary) hypertension: Secondary | ICD-10-CM | POA: Diagnosis not present

## 2024-01-31 NOTE — Patient Instructions (Signed)
 Medication Instructions:  Stop metoprolol  *If you need a refill on your cardiac medications before your next appointment, please call your pharmacy*  Lab Work: No labs If you have labs (blood work) drawn today and your tests are completely normal, you will receive your results only by: MyChart Message (if you have MyChart) OR A paper copy in the mail If you have any lab test that is abnormal or we need to change your treatment, we will call you to review the results.  Testing/Procedures: No testing  Follow-Up: At [facility], you and your health needs are our priority.  As part of our continuing mission to provide you with exceptional heart care, our providers are all part of one team.  This team includes your primary Cardiologist (physician) and Advanced Practice Providers or APPs (Physician Assistants and Nurse Practitioners) who all work together to provide you with the care you need, when you need it.  Your next appointment:   6 month(s)  Provider:   Alexandria Angel, MD    We recommend signing up for the patient portal called "MyChart".  Sign up information is provided on this After Visit Summary.  MyChart is used to connect with patients for Virtual Visits (Telemedicine).  Patients are able to view lab/test results, encounter notes, upcoming appointments, etc.  Non-urgent messages can be sent to your provider as well.   To learn more about what you can do with MyChart, go to [email or web address].

## 2024-02-02 ENCOUNTER — Encounter: Payer: Self-pay | Admitting: Cardiology

## 2024-02-06 DIAGNOSIS — M25519 Pain in unspecified shoulder: Secondary | ICD-10-CM | POA: Diagnosis not present

## 2024-02-24 DIAGNOSIS — M25561 Pain in right knee: Secondary | ICD-10-CM | POA: Diagnosis not present

## 2024-02-26 ENCOUNTER — Encounter: Attending: Registered Nurse | Admitting: Physical Medicine and Rehabilitation

## 2024-02-26 VITALS — BP 142/66 | HR 56 | Ht 72.0 in | Wt 188.0 lb

## 2024-02-26 DIAGNOSIS — M25561 Pain in right knee: Secondary | ICD-10-CM | POA: Diagnosis not present

## 2024-02-26 DIAGNOSIS — Z79899 Other long term (current) drug therapy: Secondary | ICD-10-CM | POA: Diagnosis not present

## 2024-02-26 DIAGNOSIS — G8929 Other chronic pain: Secondary | ICD-10-CM | POA: Insufficient documentation

## 2024-02-26 DIAGNOSIS — M25511 Pain in right shoulder: Secondary | ICD-10-CM | POA: Diagnosis not present

## 2024-02-26 DIAGNOSIS — M25512 Pain in left shoulder: Secondary | ICD-10-CM | POA: Diagnosis not present

## 2024-02-26 DIAGNOSIS — M4306 Spondylolysis, lumbar region: Secondary | ICD-10-CM | POA: Diagnosis not present

## 2024-02-26 DIAGNOSIS — Z5181 Encounter for therapeutic drug level monitoring: Secondary | ICD-10-CM | POA: Diagnosis not present

## 2024-02-26 DIAGNOSIS — G894 Chronic pain syndrome: Secondary | ICD-10-CM | POA: Diagnosis not present

## 2024-02-26 MED ORDER — CYCLOBENZAPRINE HCL 5 MG PO TABS: 2.5000 mg | ORAL_TABLET | Freq: Three times a day (TID) | ORAL | 0 refills | Status: AC | PRN

## 2024-02-26 NOTE — Progress Notes (Signed)
 Subjective:    Patient ID: Bruce Cobb, male    DOB: Mar 26, 1966, 58 y.o.   MRN: [id number]  HPI   Bruce Cobb is a 58 y.o. year old male  who  has a past medical history of BCC (basal cell carcinoma of skin), Hematospermia, Paroxysmal atrial fibrillation (HCC) (09/2017), Pars defect of lumbar spine, PONV (postoperative nausea and vomiting), Right knee DJD, and Shingles (2014).   They are presenting to PM&R clinic as a follow up for bilateral shoulder pain.   Plan from last visit:  Chronic pain syndrome Encounter for therapeutic drug monitoring Encounter for long-term current use of medication We can increase your percocet to 1 tablet 3 times daily; I will schedule your next medication refills around this.   Start Naproxen  500 mg twice daily for 2 weeks to try and reduce inflammation in your knee; stop this if it causes BP increases or stomach issues.   I am prescribing a steroid pack for late April for your trip   Once your pain is better under control, we will re-address switching to a Butrans patch   Follow up in 3 months   Chronic pain of both shoulders Less bothersome in the knee currently   Has failed bilateral replacements, medication management, and peripheral nerve blocks at this point.   Spoke with Dr. Cristy 3-31; do not feel further interventions would assist in pain management for the patient's bilateral shoulders.  Unable to do injections intra-articularly on either side.  Amount of pain exceeds pathology as expected; ?  CRPS   Chronic pain of right knee Continue compression on your knee and follow-up with Dr. Cristy for further intervention.   Interval Hx:  - Therapies: Patient started going back to the gym - he was seeing a PT once a week but he left - but he has noticed his shoulders will hurt after no matter what exercises he does. He does incorporate core as part of his regimen.    - Follow ups: He went to his chiropactor and was told he has  spondylolisthesis, treatment will help but leg presses at the gym have bene exacerbating it.   Dr. Dyanna - did an ultrasound of his knee and found I have cement on my popliteus tendon - shot it with cortisone and he had excellent results.     - Falls: none   - DME: Got an adjustable bed, which they    - Medications:  Naprosyn  was not helpful at all.  He puts his pain medication on the night stand next to him to take first thing - this has helped significantly. Steroid dosepak before [place] helped SIGNIFICANTLY. He was able to keep up with 20 year olds.   Oy 7.5 usually 1.5 in the morning and 1 at night  - worse days 3 tabs; cold and damp weather makes it much worse.      Pain Inventory Average Pain 7 Pain Right Now 7 My pain is constant, dull, and aching  In the last 24 hours, has pain interfered with the following? General activity 6 Relation with others 7 Enjoyment of life 8 What TIME of day is your pain at its worst? morning  and night Sleep (in general) Fair  Pain is worse with: some activites Pain improves with: therapy/exercise, medication, and injections Relief from Meds: 6  Family History  Problem Relation Age of Onset   Cancer - Prostate Father    Cancer Father  prostate   Cancer Mother        lung, bones, liver   ADD / ADHD Sister    ADD / ADHD Brother    Diabetes Neg Hx    CAD Neg Hx    Social History   Socioeconomic History   Marital status: Married    Spouse name: Not on file   Number of children: 3   Years of education: Not on file   Highest education level: Not on file  Occupational History   Occupation: Merchant navy officer , has a farm   Occupation: Heritage manager at a [employer]  Tobacco Use   Smoking status: Never   Smokeless tobacco: Never  Vaping Use   Vaping status: Never Used  Substance and Sexual Activity   Alcohol use: Yes    Comment: 3 drinks per day   Drug use: No   Sexual activity: Yes  Other Topics  Concern   Not on file  Social History Narrative   Married, 3 children.   Wife has a history of breast cancer, recurrent, status post bilateral mastectomy   Wife also had thyroid cancer.   The patient has been very athletic all his life, playing basketball, softball, tennis etc.  As of 09/2018 is not very active due to multiple MSK issues.   Social Drivers of Corporate investment banker Strain: Not on file  Food Insecurity: Not on file  Transportation Needs: Not on file  Physical Activity: Not on file  Stress: Not on file  Social Connections: Unknown (01/16/2022)   Received from [facility]   Social Network    Social Network: Not on file   Past Surgical History:  Procedure Laterality Date   ANKLE FRACTURE SURGERY Left 1996   plated   KNEE ARTHROSCOPY Left 1990   plica removed   KNEE ARTHROSCOPY Right    7 total   NASAL SEPTUM SURGERY     ORIF DISTAL RADIUS FRACTURE Right    age 33   REVISION TOTAL KNEE ARTHROPLASTY Right 08/19/2017   REVISION TOTAL SHOULDER TO REVERSE TOTAL SHOULDER Left 10/11/2021   Procedure: LEFT REVISION TOTAL SHOULDER TO REVERSE TOTAL SHOULDER, RIGHT SHOULDER STERIOD INJECTION;  Surgeon: Bruce Cobb Bonner DASEN, MD;  Location: [facility];  Service: Orthopedics;  Laterality: Left;   SHOULDER ARTHROSCOPY W/ ROTATOR CUFF REPAIR Right 2011   bicep tendon and labrium repair   SHOULDER ARTHROSCOPY WITH ROTATOR CUFF REPAIR Left 2012   w/ labrium repair   TOTAL KNEE ARTHROPLASTY Right 10/12/2013   Procedure: RIGHT TOTAL KNEE ARTHROPLASTY;  Surgeon: Lamar DELENA Millman, MD;  Location: [facility];  Service: Orthopedics;  Laterality: Right;   VASECTOMY     Past Surgical History:  Procedure Laterality Date   ANKLE FRACTURE SURGERY Left 1996   plated   KNEE ARTHROSCOPY Left 1990   plica removed   KNEE ARTHROSCOPY Right    7 total   NASAL SEPTUM SURGERY     ORIF DISTAL RADIUS FRACTURE Right    age 52   REVISION TOTAL KNEE ARTHROPLASTY Right 08/19/2017   REVISION TOTAL SHOULDER TO  REVERSE TOTAL SHOULDER Left 10/11/2021   Procedure: LEFT REVISION TOTAL SHOULDER TO REVERSE TOTAL SHOULDER, RIGHT SHOULDER STERIOD INJECTION;  Surgeon: Bruce Cobb Bonner DASEN, MD;  Location: [facility];  Service: Orthopedics;  Laterality: Left;   SHOULDER ARTHROSCOPY W/ ROTATOR CUFF REPAIR Right 2011   bicep tendon and labrium repair   SHOULDER ARTHROSCOPY WITH ROTATOR CUFF REPAIR Left 2012   w/ labrium repair  TOTAL KNEE ARTHROPLASTY Right 10/12/2013   Procedure: RIGHT TOTAL KNEE ARTHROPLASTY;  Surgeon: Lamar DELENA Millman, MD;  [facility]: [facility];  Service: Orthopedics;  Laterality: Right;   VASECTOMY     Past Medical History:  Diagnosis Date   BCC (basal cell carcinoma of skin)    Hematospermia    hx of, remotely    Paroxysmal atrial fibrillation (HCC) 09/2017   saw cards   Pars defect of lumbar spine    PONV (postoperative nausea and vomiting)    Right knee DJD    Shingles 2014   face   BP (!) 142/66   Pulse (!) 56   Ht 6' (1.829 m)   Wt 188 lb (85.3 kg)   SpO2 96%   BMI 25.50 kg/m   Opioid Risk Score:   Fall Risk Score:  `1  Depression screen [facility] 2/9     10/02/2023    1:35 PM 04/15/2023    2:32 PM 01/21/2023    3:08 PM 09/24/2018   12:21 PM  Depression screen PHQ 2/9  Decreased Interest 0 0 2 0  Down, Depressed, Hopeless 0 0 3 0  PHQ - 2 Score 0 0 5 0  Altered sleeping   3 1  Tired, decreased energy   1 0  Change in appetite   2 0  Feeling bad or failure about yourself    0 0  Trouble concentrating   0 0  Moving slowly or fidgety/restless   0 0  Suicidal thoughts   3 0  PHQ-9 Score   14 1     Review of Systems  Musculoskeletal:  Positive for back pain.       Bilateral shoulder pain Right knee pain  All other systems reviewed and are negative.     Objective:   Physical Exam   PE: Constitution: Appropriate appearance for age. No apparent distress   Resp: No respiratory distress. No accessory muscle usage. on RA Cardio: Well perfused appearance. No peripheral  edema. Abdomen: Nondistended. Nontender.   Psych: Appropriate mood and affect. Neuro: AAOx4. No apparent cognitive deficits    MSK: Rom BL shoulders improved, internal rotation and abduction limited + TTP anterior R shoulder  R knee pain on palpation of posterior knee, otherwise negative testing. ROM WNL  L lumbar paraspinals tight/ttp; no apparent deformity, trauma, or scoliosis.      Assessment & Plan:   Bruce Cobb is a 58 y.o. year old male  who  has a past medical history of BCC (basal cell carcinoma of skin), Hematospermia, Paroxysmal atrial fibrillation (HCC) (09/2017), Pars defect of lumbar spine, PONV (postoperative nausea and vomiting), Right knee DJD, and Shingles (2014).  They are presenting to PM&R clinic as a follow up for bilateral shoulder pain following R shoulder replacement, L reverse arthoplasty.  Chronic pain syndrome Encounter for therapeutic drug monitoring Encounter for long-term current use of medication Continue oxycodone  acetaminophen 's 7.5 mg up to 3 times daily for pain in your bilateral shoulders.  Let me know if you have any trips coming up; we can do up to 3 steroid Dosepaks a year for this purpose (#1 12/2023)  Follow-up in 3 months for chronic pain management with myself or my nurse practitioner Fidela.  Chronic pain of both shoulders Failed b/l lateral pectoral nerve ablations with [facility]; spoke with orthopedic surgeon, no addition interventions recommended, not a candidate for injections.   Continue to go to the gym, and work on range of motion of the shoulders,  as well as strengthening.  I can see really good gains here already, and him happy that the shoulders are not getting worse and that you are stable on a regimen!  Chronic pain of right knee Much improved following popliteus injection with Ortho; no change in treatment  Pars defect of lumbar spine  For Low back pain/spasms, I am prescribing 2 weeks of Flexeril  as needed and recommending  you use your home TENS unit on the right low back.  You can continue to see your chiropractor as well.  Other orders -     Cyclobenzaprine  HCl; Take 0.5 tablets (2.5 mg total) by mouth 3 (three) times daily as needed for up to 14 days for muscle spasms.  Dispense: 21 tablet; Refill: 0

## 2024-02-26 NOTE — Patient Instructions (Addendum)
 Continue oxycodone  acetaminophen 's 7.5 mg up to 3 times daily for pain in your bilateral shoulders.  For: Low back pain/spasms, I am prescribing 2 weeks of Flexeril  as needed and recommending you use your home TENS unit on the right low back.  You can continue to see your chiropractor as well.  Continue to go to the gym, and work on range of motion of the shoulders, as well as strengthening.  I can see really good gains here already, and him happy that the shoulders are not getting worse and that you are stable on a regimen!  Let me know if you have any trips coming up; we can do up to 3 steroid Dosepaks a year for this purpose.  Follow-up in 3 months for chronic pain management with myself or my nurse practitioner Fidela.

## 2024-03-02 MED ORDER — OXYCODONE-ACETAMINOPHEN 7.5-325 MG PO TABS: 1.0000 | ORAL_TABLET | Freq: Three times a day (TID) | ORAL | 0 refills | Status: DC | PRN

## 2024-03-02 MED ORDER — OXYCODONE-ACETAMINOPHEN 7.5-325 MG PO TABS: 1.0000 | ORAL_TABLET | Freq: Three times a day (TID) | ORAL | 0 refills | Status: AC | PRN

## 2024-05-08 DIAGNOSIS — M546 Pain in thoracic spine: Secondary | ICD-10-CM | POA: Diagnosis not present

## 2024-05-08 DIAGNOSIS — M9901 Segmental and somatic dysfunction of cervical region: Secondary | ICD-10-CM | POA: Diagnosis not present

## 2024-05-08 DIAGNOSIS — M542 Cervicalgia: Secondary | ICD-10-CM | POA: Diagnosis not present

## 2024-05-08 DIAGNOSIS — M9902 Segmental and somatic dysfunction of thoracic region: Secondary | ICD-10-CM | POA: Diagnosis not present

## 2024-05-20 DIAGNOSIS — M5431 Sciatica, right side: Secondary | ICD-10-CM | POA: Diagnosis not present

## 2024-05-27 ENCOUNTER — Encounter: Attending: Physical Medicine and Rehabilitation | Admitting: Physical Medicine and Rehabilitation

## 2024-05-27 VITALS — BP 169/109 | HR 72 | Ht 72.0 in | Wt 183.0 lb

## 2024-05-27 DIAGNOSIS — Z5181 Encounter for therapeutic drug level monitoring: Secondary | ICD-10-CM | POA: Insufficient documentation

## 2024-05-27 DIAGNOSIS — I10 Essential (primary) hypertension: Secondary | ICD-10-CM | POA: Diagnosis not present

## 2024-05-27 DIAGNOSIS — M25512 Pain in left shoulder: Secondary | ICD-10-CM | POA: Diagnosis not present

## 2024-05-27 DIAGNOSIS — M546 Pain in thoracic spine: Secondary | ICD-10-CM | POA: Diagnosis not present

## 2024-05-27 DIAGNOSIS — G894 Chronic pain syndrome: Secondary | ICD-10-CM | POA: Insufficient documentation

## 2024-05-27 DIAGNOSIS — M25561 Pain in right knee: Secondary | ICD-10-CM | POA: Insufficient documentation

## 2024-05-27 DIAGNOSIS — Z79899 Other long term (current) drug therapy: Secondary | ICD-10-CM | POA: Insufficient documentation

## 2024-05-27 DIAGNOSIS — G8929 Other chronic pain: Secondary | ICD-10-CM | POA: Insufficient documentation

## 2024-05-27 DIAGNOSIS — M25511 Pain in right shoulder: Secondary | ICD-10-CM | POA: Diagnosis not present

## 2024-05-27 MED ORDER — METHOCARBAMOL 500 MG PO TABS: 500.0000 mg | ORAL_TABLET | Freq: Three times a day (TID) | ORAL | 2 refills | Status: DC | PRN

## 2024-05-27 NOTE — Patient Instructions (Addendum)
 Xray ordered for your upper back; I will messaging you regarding results  I am sending you to PT for your neck and shoulders  Switch flexeril  to methocarbimol for muscle relaxer as needed  I will refill percocet for 3 months  Follow up in 1-2 weeks for trigger point injections; we may consider botox for the neck in the future.   We discussed your ongoing magnesium supplementation and recently coming off metoprolol ; advised reaching out to his PCP, we will also send them our

## 2024-05-27 NOTE — Progress Notes (Signed)
 Subjective:    Patient ID: Bruce Cobb, male    DOB: [birth year], 58 y.o.   MRN: [id number]  HPI  Bruce Cobb is a 58 y.o. year old male  who  has a past medical history of BCC (basal cell carcinoma of skin), Hematospermia, Paroxysmal atrial fibrillation (HCC) (09/2017), Pars defect of lumbar spine, PONV (postoperative nausea and vomiting), Right knee DJD, and Shingles (2014).    They are presenting to PM&R clinic as a follow up for bilateral shoulder pain following R shoulder replacement, L reverse arthoplasty.   Plan from last visit: Chronic pain syndrome Encounter for therapeutic drug monitoring Encounter for long-term current use of medication Continue oxycodone  acetaminophen 's 7.5 mg up to 3 times daily for pain in your bilateral shoulders.   Let me know if you have any trips coming up; we can do up to 3 steroid Dosepaks a year for this purpose (#1 12/2023)   Follow-up in 3 months for chronic pain management with myself or my nurse practitioner Bruce Cobb.   Chronic pain of both shoulders Failed b/l lateral pectoral nerve ablations with [facility]; spoke with orthopedic surgeon, no addition interventions recommended, not a candidate for injections.    Continue to go to the gym, and work on range of motion of the shoulders, as well as strengthening.  I can see really good gains here already, and him happy that the shoulders are not getting worse and that you are stable on a regimen!   Chronic pain of right knee Much improved following popliteus injection with Ortho; no change in treatment   Pars defect of lumbar spine   For Low back pain/spasms, I am prescribing 2 weeks of Flexeril  as needed and recommending you use your home TENS unit on the right low back.  You can continue to see your chiropractor as well.   Interval Hx:  - Therapies: Went to [place] to see Bruce Cobb in July and [place] to see Bruce Cobb. Was able to do bike rides at the beach. Recently hay harvest so has been very  busy, lots of lifting. He started working out at [employer] in May; has intermittently been going on and off - PT in the same gym helped with stretching and exercises, but is no longer there. Made him realize his posture is poor with forward hunching  - finds if he lays back and takes the pressure off his shoulders. Wants to find that PT again, name is Bruce Cobb - used wot work for [employer].     - Follow ups: Is due to another popliteus injection; was very helpful.   - Falls: Labor day was playing a frisbee game and he went back to grab a frisbee; hit a ditch in the sand and since then has had pain in his upper thoracic spine on the right. Felt a crunch. Has locked his whole back and neck up. Went to a chiropractor 3x; no benefit. Is hearing    - DME: none   - Medications: Percocet refilled 05/05/24 - rarely needs the third tab. Takes a flexeril  occassionally, but will only take 1/2 tab because it knowck him out. He still feels groggy the next day.   - Other concerns: none  Notes he has had trigger point in the past with   Pain Inventory Average Pain 7 Pain Right Now 8 My pain is dull and aching  In the last 24 hours, has pain interfered with the following? General activity 9 Relation with  others 7 Enjoyment of life 9 What TIME of day is your pain at its worst? varies Sleep (in general) Poor  Pain is worse with: unsure Pain improves with: rest, therapy/exercise, medication, and injections Relief from Meds: 5  Family History  Problem Relation Age of Onset   Cancer - Prostate Father    Cancer Father        prostate   Cancer Mother        lung, bones, liver   ADD / ADHD Sister    ADD / ADHD Brother    Diabetes Neg Hx    CAD Neg Hx    Social History   Socioeconomic History   Marital status: Married    Spouse name: Not on file   Number of children: 3   Years of education: Not on file   Highest education level: Not on file  Occupational History   Occupation:  Merchant navy officer , has a farm   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   Wife has a history of breast cancer, recurrent, status post bilateral mastectomy   Wife also had thyroid  cancer.   The patient has been very athletic all his life, playing basketball, softball, tennis etc.  As of 09/2018 is not very active due to multiple MSK issues.   Social Drivers of Corporate investment banker Strain: Not on file  Food Insecurity: Not on file  Transportation Needs: Not on file  Physical Activity: Not on file  Stress: Not on file  Social Connections: Unknown (01/16/2022)   Received from [facility]   Social Network    Social Network: Not on file   Past Surgical History:  Procedure Laterality Date   ANKLE FRACTURE SURGERY Left 1996   plated   KNEE ARTHROSCOPY Left 1990   plica removed   KNEE ARTHROSCOPY Right    7 total   NASAL SEPTUM SURGERY     ORIF DISTAL RADIUS FRACTURE Right    age 36   REVISION TOTAL KNEE ARTHROPLASTY Right 08/19/2017   REVISION TOTAL SHOULDER TO REVERSE TOTAL SHOULDER Left 10/11/2021   Procedure: LEFT REVISION TOTAL SHOULDER TO REVERSE TOTAL SHOULDER, RIGHT SHOULDER STERIOD INJECTION;  Surgeon: Cristy Bonner DASEN, MD;  Location: [facility];  Service: Orthopedics;  Laterality: Left;   SHOULDER ARTHROSCOPY W/ ROTATOR CUFF REPAIR Right 2011   bicep tendon and labrium repair   SHOULDER ARTHROSCOPY WITH ROTATOR CUFF REPAIR Left 2012   w/ labrium repair   TOTAL KNEE ARTHROPLASTY Right 10/12/2013   Procedure: RIGHT TOTAL KNEE ARTHROPLASTY;  Surgeon: Lamar DELENA Millman, MD;  Location: [facility];  Service: Orthopedics;  Laterality: Right;   VASECTOMY     Past Surgical History:   Procedure Laterality Date   ANKLE FRACTURE SURGERY Left 1996   plated   KNEE ARTHROSCOPY Left 1990   plica removed   KNEE ARTHROSCOPY Right    7 total   NASAL SEPTUM SURGERY     ORIF DISTAL RADIUS FRACTURE Right    age 71   REVISION TOTAL KNEE ARTHROPLASTY Right 08/19/2017   REVISION TOTAL SHOULDER TO REVERSE TOTAL SHOULDER  Left 10/11/2021   Procedure: LEFT REVISION TOTAL SHOULDER TO REVERSE TOTAL SHOULDER, RIGHT SHOULDER STERIOD INJECTION;  Surgeon: Cristy Bonner DASEN, MD;  Location: [facility];  Service: Orthopedics;  Laterality: Left;   SHOULDER ARTHROSCOPY W/ ROTATOR CUFF REPAIR Right 2011   bicep tendon and labrium repair   SHOULDER ARTHROSCOPY WITH ROTATOR CUFF REPAIR Left 2012   w/ labrium repair   TOTAL KNEE ARTHROPLASTY Right 10/12/2013   Procedure: RIGHT TOTAL KNEE ARTHROPLASTY;  Surgeon: Lamar DELENA Millman, MD;  Location: [facility];  Service: Orthopedics;  Laterality: Right;   VASECTOMY     Past Medical History:  Diagnosis Date   BCC (basal cell carcinoma of skin)    Hematospermia    hx of, remotely    Paroxysmal atrial fibrillation (HCC) 09/2017   saw cards   Pars defect of lumbar spine    PONV (postoperative nausea and vomiting)    Right knee DJD    Shingles 2014   face   BP (!) 172/100   Pulse 72   Ht 6' (1.829 m)   Wt 183 lb (83 kg)   SpO2 98%   BMI 24.82 kg/m   Opioid Risk Score:   Fall Risk Score:  `1  Depression screen [facility] 2/9     10/02/2023    1:35 PM 04/15/2023    2:32 PM 01/21/2023    3:08 PM 09/24/2018   12:21 PM  Depression screen PHQ 2/9  Decreased Interest 0 0 2 0  Down, Depressed, Hopeless 0 0 3 0  PHQ - 2 Score 0 0 5 0  Altered sleeping   3 1  Tired, decreased energy   1 0  Change in appetite   2 0  Feeling bad or failure about yourself    0 0  Trouble concentrating   0 0  Moving slowly or fidgety/restless   0 0  Suicidal thoughts   3 0  PHQ-9 Score   14 1     Review of Systems  Musculoskeletal:  Positive for back pain.       Shoulder  pain(both) Knee pain  All other systems reviewed and are negative.      Objective:   Physical Exam   PE: Constitution: Appropriate appearance for age. No apparent distress   Resp: No respiratory distress. No accessory muscle usage. on RA Cardio: Well perfused appearance. No peripheral edema. Abdomen: Nondistended. Nontender.   Psych: Appropriate mood and affect. Neuro: AAOx4. No apparent cognitive deficits      MSK: Rom BL shoulders remain limited by internal rotation and abduction   Neck ROM limited in flexion, bilateral rotation and sidebending. Anterior tild of bilateral shoulders at rest - ?anteriorcolis although no palpable spasticity in SCM, anterior scalenes, traps   R knee pain on palpation of posterior knee--unchanged.    + TTP with multiple triggerpoints R T4 paraspinal, bilateral trapezius, bilateral cervical paraspinals      Assessment & Plan:   Bruce Cobb is a 58 y.o. year old male  who  has a past medical history of BCC (basal cell carcinoma of skin), Hematospermia, Paroxysmal atrial fibrillation (HCC) (09/2017), Pars defect of lumbar spine, PONV (postoperative nausea and vomiting), Right knee DJD, and Shingles (2014).  They are presenting to PM&R clinic as a follow up for bilateral shoulder pain following R shoulder replacement, L reverse arthoplasty.   Chronic pain syndrome Encounter for therapeutic drug monitoring Encounter for long-term current use of medication Chronic pain of both shoulders -  Ambulatory referral to Physical Therapy -     Methocarbamol ; Take 1 tablet (500 mg total) by mouth every 8 (eight) hours as needed for muscle spasms.  Dispense: 90 tablet; Refill: 2  I am sending you to PT for your neck and shoulders  Switch flexeril  to methocarbimol for muscle relaxer as needed  I will refill percocet for 3 months  Essential hypertension Blood pressure 169/109 today; patient states he generally feels his blood pressure when it is this  high but has no findings on physical exam and denies any vision deficits, facial droop, slurred speech, sensory changes or weakness.  Blood pressure was 142/66 last visit.   We discussed your ongoing magnesium supplementation as being an inadequate means of blood pressure control at this level, and recently coming off metoprolol  has likely affected his blood pressure control; advised him reaching out to his PCP ASAP, we will also send them our note from today so they are aware.  Posterior knee pain, right Due for popliteus injection with sports medicine soon, which was very helpful last time  Acute right-sided thoracic back pain -     DG Thoracic Spine 2 View; Future  Status post fall  Xray ordered for your upper back; I will messaging you regarding results    Follow up in 1-2 weeks for trigger point injections; we may consider botox for the neck in the future.

## 2024-06-01 DIAGNOSIS — M5431 Sciatica, right side: Secondary | ICD-10-CM | POA: Diagnosis not present

## 2024-06-01 DIAGNOSIS — M9903 Segmental and somatic dysfunction of lumbar region: Secondary | ICD-10-CM | POA: Diagnosis not present

## 2024-06-01 MED ORDER — OXYCODONE-ACETAMINOPHEN 7.5-325 MG PO TABS: 1.0000 | ORAL_TABLET | Freq: Three times a day (TID) | ORAL | 0 refills | Status: AC | PRN

## 2024-06-01 MED ORDER — OXYCODONE-ACETAMINOPHEN 7.5-325 MG PO TABS: 1.0000 | ORAL_TABLET | Freq: Three times a day (TID) | ORAL | 0 refills | Status: DC | PRN

## 2024-06-02 ENCOUNTER — Telehealth: Payer: Self-pay | Admitting: *Deleted

## 2024-06-02 NOTE — Telephone Encounter (Signed)
 Waddell called from [facility] and said that they have not been able to get in touch with the pt.Ispoke to him and he has been out of town and will call them when he is back in town.I have let [employer] know this.

## 2024-06-09 DIAGNOSIS — Z96651 Presence of right artificial knee joint: Secondary | ICD-10-CM | POA: Diagnosis not present

## 2024-06-09 DIAGNOSIS — M76891 Other specified enthesopathies of right lower limb, excluding foot: Secondary | ICD-10-CM | POA: Diagnosis not present

## 2024-06-09 DIAGNOSIS — M25561 Pain in right knee: Secondary | ICD-10-CM | POA: Diagnosis not present

## 2024-06-11 ENCOUNTER — Encounter: Admitting: Physical Medicine and Rehabilitation

## 2024-06-15 DIAGNOSIS — D0439 Carcinoma in situ of skin of other parts of face: Secondary | ICD-10-CM | POA: Diagnosis not present

## 2024-06-15 DIAGNOSIS — L570 Actinic keratosis: Secondary | ICD-10-CM | POA: Diagnosis not present

## 2024-06-15 DIAGNOSIS — Z85828 Personal history of other malignant neoplasm of skin: Secondary | ICD-10-CM | POA: Diagnosis not present

## 2024-06-15 DIAGNOSIS — D044 Carcinoma in situ of skin of scalp and neck: Secondary | ICD-10-CM | POA: Diagnosis not present

## 2024-06-15 DIAGNOSIS — L821 Other seborrheic keratosis: Secondary | ICD-10-CM | POA: Diagnosis not present

## 2024-06-15 DIAGNOSIS — L738 Other specified follicular disorders: Secondary | ICD-10-CM | POA: Diagnosis not present

## 2024-06-17 DIAGNOSIS — M25512 Pain in left shoulder: Secondary | ICD-10-CM | POA: Diagnosis not present

## 2024-06-17 DIAGNOSIS — M25611 Stiffness of right shoulder, not elsewhere classified: Secondary | ICD-10-CM | POA: Diagnosis not present

## 2024-06-17 DIAGNOSIS — M25612 Stiffness of left shoulder, not elsewhere classified: Secondary | ICD-10-CM | POA: Diagnosis not present

## 2024-06-17 DIAGNOSIS — M25511 Pain in right shoulder: Secondary | ICD-10-CM | POA: Diagnosis not present

## 2024-06-22 DIAGNOSIS — M25612 Stiffness of left shoulder, not elsewhere classified: Secondary | ICD-10-CM | POA: Diagnosis not present

## 2024-06-22 DIAGNOSIS — M25512 Pain in left shoulder: Secondary | ICD-10-CM | POA: Diagnosis not present

## 2024-06-22 DIAGNOSIS — M25511 Pain in right shoulder: Secondary | ICD-10-CM | POA: Diagnosis not present

## 2024-06-22 DIAGNOSIS — M25611 Stiffness of right shoulder, not elsewhere classified: Secondary | ICD-10-CM | POA: Diagnosis not present

## 2024-06-24 DIAGNOSIS — M25611 Stiffness of right shoulder, not elsewhere classified: Secondary | ICD-10-CM | POA: Diagnosis not present

## 2024-06-24 DIAGNOSIS — M25511 Pain in right shoulder: Secondary | ICD-10-CM | POA: Diagnosis not present

## 2024-06-24 DIAGNOSIS — M25512 Pain in left shoulder: Secondary | ICD-10-CM | POA: Diagnosis not present

## 2024-06-24 DIAGNOSIS — M25612 Stiffness of left shoulder, not elsewhere classified: Secondary | ICD-10-CM | POA: Diagnosis not present

## 2024-07-08 DIAGNOSIS — M25511 Pain in right shoulder: Secondary | ICD-10-CM | POA: Diagnosis not present

## 2024-07-08 DIAGNOSIS — M25612 Stiffness of left shoulder, not elsewhere classified: Secondary | ICD-10-CM | POA: Diagnosis not present

## 2024-07-08 DIAGNOSIS — M25512 Pain in left shoulder: Secondary | ICD-10-CM | POA: Diagnosis not present

## 2024-07-08 DIAGNOSIS — M25611 Stiffness of right shoulder, not elsewhere classified: Secondary | ICD-10-CM | POA: Diagnosis not present

## 2024-07-10 DIAGNOSIS — M25611 Stiffness of right shoulder, not elsewhere classified: Secondary | ICD-10-CM | POA: Diagnosis not present

## 2024-07-10 DIAGNOSIS — M25512 Pain in left shoulder: Secondary | ICD-10-CM | POA: Diagnosis not present

## 2024-07-10 DIAGNOSIS — M25612 Stiffness of left shoulder, not elsewhere classified: Secondary | ICD-10-CM | POA: Diagnosis not present

## 2024-07-10 DIAGNOSIS — M25511 Pain in right shoulder: Secondary | ICD-10-CM | POA: Diagnosis not present

## 2024-07-13 ENCOUNTER — Encounter: Payer: Self-pay | Admitting: Physical Medicine and Rehabilitation

## 2024-07-13 DIAGNOSIS — G8929 Other chronic pain: Secondary | ICD-10-CM

## 2024-07-13 DIAGNOSIS — M25619 Stiffness of unspecified shoulder, not elsewhere classified: Secondary | ICD-10-CM

## 2024-08-04 DIAGNOSIS — M25512 Pain in left shoulder: Secondary | ICD-10-CM | POA: Diagnosis not present

## 2024-08-04 DIAGNOSIS — M25612 Stiffness of left shoulder, not elsewhere classified: Secondary | ICD-10-CM | POA: Diagnosis not present

## 2024-08-04 DIAGNOSIS — M25511 Pain in right shoulder: Secondary | ICD-10-CM | POA: Diagnosis not present

## 2024-08-04 DIAGNOSIS — M25611 Stiffness of right shoulder, not elsewhere classified: Secondary | ICD-10-CM | POA: Diagnosis not present

## 2024-08-07 DIAGNOSIS — M25511 Pain in right shoulder: Secondary | ICD-10-CM | POA: Diagnosis not present

## 2024-08-07 DIAGNOSIS — M25612 Stiffness of left shoulder, not elsewhere classified: Secondary | ICD-10-CM | POA: Diagnosis not present

## 2024-08-07 DIAGNOSIS — M25611 Stiffness of right shoulder, not elsewhere classified: Secondary | ICD-10-CM | POA: Diagnosis not present

## 2024-08-07 DIAGNOSIS — M25512 Pain in left shoulder: Secondary | ICD-10-CM | POA: Diagnosis not present

## 2024-08-11 DIAGNOSIS — S63641A Sprain of metacarpophalangeal joint of right thumb, initial encounter: Secondary | ICD-10-CM | POA: Diagnosis not present

## 2024-08-14 DIAGNOSIS — M25611 Stiffness of right shoulder, not elsewhere classified: Secondary | ICD-10-CM | POA: Diagnosis not present

## 2024-08-14 DIAGNOSIS — M25612 Stiffness of left shoulder, not elsewhere classified: Secondary | ICD-10-CM | POA: Diagnosis not present

## 2024-08-14 DIAGNOSIS — M25511 Pain in right shoulder: Secondary | ICD-10-CM | POA: Diagnosis not present

## 2024-08-14 DIAGNOSIS — M25512 Pain in left shoulder: Secondary | ICD-10-CM | POA: Diagnosis not present

## 2024-08-21 NOTE — Progress Notes (Unsigned)
 "  Subjective:    Patient ID: Bruce Cobb, male    DOB: 06/05/1966, 57 y.o.   MRN: [id number]  HPI   Pain Inventory Average Pain {NUMBERS; 0-10:5044} Pain Right Now {NUMBERS; 0-10:5044} My pain is {PAIN DESCRIPTION:21022940}  In the last 24 hours, has pain interfered with the following? General activity {NUMBERS; 0-10:5044} Relation with others {NUMBERS; 0-10:5044} Enjoyment of life {NUMBERS; 0-10:5044} What TIME of day is your pain at its worst? {time of day:24191} Sleep (in general) {BHH GOOD/FAIR/POOR:22877}  Pain is worse with: {ACTIVITIES:21022942} Pain improves with: {PAIN IMPROVES TPUY:78977056} Relief from Meds: {NUMBERS; 0-10:5044}  Family History  Problem Relation Age of Onset   Cancer - Prostate Father    Cancer Father        prostate   Cancer Mother        lung, bones, liver   ADD / ADHD Sister    ADD / ADHD Brother    Diabetes Neg Hx    CAD Neg Hx    Social History   Socioeconomic History   Marital status: Married    Spouse name: Not on file   Number of children: 3   Years of education: Not on file   Highest education level: Not on file  Occupational History   Occupation: merchant navy officer , has a farm   Occupation: heritage manager at a [employer]  Tobacco Use   Smoking status: Never   Smokeless tobacco: Never  Vaping Use   Vaping status: Never Used  Substance and Sexual Activity   Alcohol use: Yes    Comment: 3 drinks per day   Drug use: No   Sexual activity: Yes  Other Topics Concern   Not on file  Social History Narrative   Married, 3 children.   Wife has a history of breast cancer, recurrent, status post bilateral mastectomy   Wife also had thyroid  cancer.   The patient has been very athletic all his life, playing basketball, softball, tennis etc.  As of 09/2018 is not very active due to multiple MSK issues.   Social Drivers of Health   Tobacco Use: Low Risk (07/17/2024)   Received from [facility]   Patient History    Smoking Tobacco Use:  Never    Smokeless Tobacco Use: Never    Passive Exposure: Not on file  Financial Resource Strain: Not on file  Food Insecurity: Not on file  Transportation Needs: Not on file  Physical Activity: Not on file  Stress: Not on file  Social Connections: Not on file  Depression (PHQ2-9): Low Risk (10/02/2023)   Depression (PHQ2-9)    PHQ-2 Score: 0  Alcohol Screen: Not on file  Housing: Unknown (09/18/2023)   Received from [facility]   Epic    At any time in the past 12 months, were you homeless or living in a shelter (including now)?: No    Number of Times Moved in the Last Year: Not on file    Unable to Pay for Housing in the Last Year: Not on file  Utilities: Not on file  Health Literacy: Not on file   Past Surgical History:  Procedure Laterality Date   ANKLE FRACTURE SURGERY Left 1996   plated   KNEE ARTHROSCOPY Left 1990   plica removed   KNEE ARTHROSCOPY Right    7 total   NASAL SEPTUM SURGERY     ORIF DISTAL RADIUS FRACTURE Right    age 35   REVISION TOTAL KNEE ARTHROPLASTY Right 08/19/2017  REVISION TOTAL SHOULDER TO REVERSE TOTAL SHOULDER Left 10/11/2021   Procedure: LEFT REVISION TOTAL SHOULDER TO REVERSE TOTAL SHOULDER, RIGHT SHOULDER STERIOD INJECTION;  Surgeon: Cristy Bonner DASEN, MD;  Location: [facility];  Service: Orthopedics;  Laterality: Left;   SHOULDER ARTHROSCOPY W/ ROTATOR CUFF REPAIR Right 2011   bicep tendon and labrium repair   SHOULDER ARTHROSCOPY WITH ROTATOR CUFF REPAIR Left 2012   w/ labrium repair   TOTAL KNEE ARTHROPLASTY Right 10/12/2013   Procedure: RIGHT TOTAL KNEE ARTHROPLASTY;  Surgeon: Lamar DELENA Millman, MD;  Location: [facility];  Service: Orthopedics;  Laterality: Right;   VASECTOMY     Past Surgical History:  Procedure Laterality Date   ANKLE FRACTURE SURGERY Left 1996   plated   KNEE ARTHROSCOPY Left 1990   plica removed   KNEE ARTHROSCOPY Right    7 total   NASAL SEPTUM SURGERY     ORIF DISTAL RADIUS FRACTURE Right    age 34    REVISION TOTAL KNEE ARTHROPLASTY Right 08/19/2017   REVISION TOTAL SHOULDER TO REVERSE TOTAL SHOULDER Left 10/11/2021   Procedure: LEFT REVISION TOTAL SHOULDER TO REVERSE TOTAL SHOULDER, RIGHT SHOULDER STERIOD INJECTION;  Surgeon: Cristy Bonner DASEN, MD;  Location: [facility];  Service: Orthopedics;  Laterality: Left;   SHOULDER ARTHROSCOPY W/ ROTATOR CUFF REPAIR Right 2011   bicep tendon and labrium repair   SHOULDER ARTHROSCOPY WITH ROTATOR CUFF REPAIR Left 2012   w/ labrium repair   TOTAL KNEE ARTHROPLASTY Right 10/12/2013   Procedure: RIGHT TOTAL KNEE ARTHROPLASTY;  Surgeon: Lamar DELENA Millman, MD;  Location: [facility];  Service: Orthopedics;  Laterality: Right;   VASECTOMY     Past Medical History:  Diagnosis Date   BCC (basal cell carcinoma of skin)    Hematospermia    hx of, remotely    Paroxysmal atrial fibrillation (HCC) 09/2017   saw cards   Pars defect of lumbar spine    PONV (postoperative nausea and vomiting)    Right knee DJD    Shingles 2014   face   There were no vitals taken for this visit.  Opioid Risk Score:   Fall Risk Score:  `1  Depression screen [facility] 2/9     10/02/2023    1:35 PM 04/15/2023    2:32 PM 01/21/2023    3:08 PM 09/24/2018   12:21 PM  Depression screen PHQ 2/9  Decreased Interest 0 0 2 0  Down, Depressed, Hopeless 0 0 3 0  PHQ - 2 Score 0 0 5 0  Altered sleeping   3 1  Tired, decreased energy   1 0  Change in appetite   2 0  Feeling bad or failure about yourself    0 0  Trouble concentrating   0 0  Moving slowly or fidgety/restless   0 0  Suicidal thoughts   3 0  PHQ-9 Score   14  1      Data saved with a previous flowsheet row definition    Review of Systems     Objective:   Physical Exam        Assessment & Plan:    "

## 2024-08-24 ENCOUNTER — Encounter: Attending: Physical Medicine and Rehabilitation | Admitting: Physical Medicine and Rehabilitation

## 2024-08-24 ENCOUNTER — Encounter: Payer: Self-pay | Admitting: Physical Medicine and Rehabilitation

## 2024-08-24 VITALS — BP 156/98 | HR 94 | Ht 72.0 in | Wt 180.0 lb

## 2024-08-24 DIAGNOSIS — G8929 Other chronic pain: Secondary | ICD-10-CM | POA: Insufficient documentation

## 2024-08-24 DIAGNOSIS — M25561 Pain in right knee: Secondary | ICD-10-CM | POA: Diagnosis present

## 2024-08-24 DIAGNOSIS — M25511 Pain in right shoulder: Secondary | ICD-10-CM | POA: Diagnosis present

## 2024-08-24 DIAGNOSIS — Z79899 Other long term (current) drug therapy: Secondary | ICD-10-CM | POA: Insufficient documentation

## 2024-08-24 DIAGNOSIS — Z5181 Encounter for therapeutic drug level monitoring: Secondary | ICD-10-CM | POA: Insufficient documentation

## 2024-08-24 DIAGNOSIS — M25512 Pain in left shoulder: Secondary | ICD-10-CM | POA: Diagnosis present

## 2024-08-24 DIAGNOSIS — I10 Essential (primary) hypertension: Secondary | ICD-10-CM | POA: Diagnosis present

## 2024-08-24 DIAGNOSIS — G894 Chronic pain syndrome: Secondary | ICD-10-CM | POA: Insufficient documentation

## 2024-08-24 MED ORDER — OXYCODONE-ACETAMINOPHEN 7.5-325 MG PO TABS: 1.0000 | ORAL_TABLET | Freq: Three times a day (TID) | ORAL | 0 refills | Status: AC | PRN

## 2024-08-24 NOTE — Patient Instructions (Addendum)
 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 - Continue physical therapy focusing on stretching and ROM exercises. - Adhere to home exercise program between PT sessions. - Use topical agents like Salonpas patches for pain. - Use Percocet 7.5 mg PRN for breakthrough pain. - Encourage activity pacing, use analgesics or rest post-strenuous tasks. - Submit new PT prescription at new year for continued therapy at [facility]. - Discussed trigger point injections for pain control; unforunately s/p TSA b/l  not suitable for biceps tendon injections.  Chronic pain syndrome Chronic pain in multiple musculoskeletal regions managed with multimodal therapy. - Continue Percocet 7.5 mg ID PRN for pain control. - Use intermittent ibuprofen  for acute exacerbations, caution on frequency. - Use topical analgesics (Salonpas patches) for localized pain. - Reinforce activity pacing and rest post-strenuous tasks. - Continue physical therapy and home exercise program  Chronic right popliteal tendonitis Chronic right knee pain with intermittent swelling, persistent despite prior injections, possibly activity-related. - Continue physical therapy and home exercises as tolerated. - Advise activity pacing, use analgesics or rest post-strenuous tasks.  Lateral epicondylitis, right elbow Chronic right lateral elbow pain consistent with lateral epicondylitis, worsened with lifting and resisted wrist extension. - Use counterforce brace to reduce tension on lateral epicondyle. - Discuss specific exercises for lateral epicondylitis with PT, including [place] Blue. - Educate on trigger point injection option if pain persists, spaced six weeks apart if multiple (ex knee) - Consider use of compression sling as needed for support. Ex below   - Advise symptom  monitoring and activity modification as tolerated.  '  HYPERTENSION - will look for PCP to establish with in [place], [place] and send referral. Please get labs as ordered by Cardiology.    Contains text generated by [employer].

## 2024-08-30 ENCOUNTER — Other Ambulatory Visit: Payer: Self-pay | Admitting: Physical Medicine and Rehabilitation

## 2024-08-30 DIAGNOSIS — G894 Chronic pain syndrome: Secondary | ICD-10-CM

## 2024-08-30 DIAGNOSIS — G8929 Other chronic pain: Secondary | ICD-10-CM

## 2024-11-23 ENCOUNTER — Encounter: Admitting: Physical Medicine and Rehabilitation
